# Patient Record
Sex: Female | Born: 1955 | Race: White | Hispanic: No | Marital: Married | Smoking: Never smoker
Health system: Southern US, Community
[De-identification: ages and names within clinical notes are randomized; demographics above are authoritative.]

## PROBLEM LIST (undated history)

## (undated) DIAGNOSIS — K219 Gastro-esophageal reflux disease without esophagitis: Secondary | ICD-10-CM

## (undated) DIAGNOSIS — R140 Abdominal distension (gaseous): Secondary | ICD-10-CM

## (undated) DIAGNOSIS — R197 Diarrhea, unspecified: Secondary | ICD-10-CM

## (undated) DIAGNOSIS — R142 Eructation: Secondary | ICD-10-CM

## (undated) DIAGNOSIS — R011 Cardiac murmur, unspecified: Secondary | ICD-10-CM

## (undated) DIAGNOSIS — E785 Hyperlipidemia, unspecified: Secondary | ICD-10-CM

## (undated) DIAGNOSIS — D126 Benign neoplasm of colon, unspecified: Secondary | ICD-10-CM

## (undated) DIAGNOSIS — C801 Malignant (primary) neoplasm, unspecified: Secondary | ICD-10-CM

## (undated) HISTORY — DX: Abdominal distension (gaseous): R14.0

## (undated) HISTORY — DX: Diarrhea, unspecified: R19.7

## (undated) HISTORY — DX: Eructation: R14.2

## (undated) HISTORY — DX: Gastro-esophageal reflux disease without esophagitis: K21.9

## (undated) HISTORY — PX: BLADDER SURGERY: SHX569

## (undated) HISTORY — DX: Benign neoplasm of colon, unspecified: D12.6

---

## 1999-02-11 ENCOUNTER — Other Ambulatory Visit: Admission: RE | Admit: 1999-02-11 | Discharge: 1999-02-11 | Payer: Self-pay | Admitting: Obstetrics and Gynecology

## 2000-03-04 ENCOUNTER — Other Ambulatory Visit: Admission: RE | Admit: 2000-03-04 | Discharge: 2000-03-04 | Payer: Self-pay | Admitting: Obstetrics and Gynecology

## 2001-03-14 ENCOUNTER — Other Ambulatory Visit: Admission: RE | Admit: 2001-03-14 | Discharge: 2001-03-14 | Payer: Self-pay | Admitting: Obstetrics and Gynecology

## 2002-04-24 ENCOUNTER — Other Ambulatory Visit: Admission: RE | Admit: 2002-04-24 | Discharge: 2002-04-24 | Payer: Self-pay | Admitting: Obstetrics and Gynecology

## 2003-05-28 ENCOUNTER — Other Ambulatory Visit: Admission: RE | Admit: 2003-05-28 | Discharge: 2003-05-28 | Payer: Self-pay | Admitting: Obstetrics and Gynecology

## 2004-07-09 ENCOUNTER — Other Ambulatory Visit: Admission: RE | Admit: 2004-07-09 | Discharge: 2004-07-09 | Payer: Self-pay | Admitting: Obstetrics and Gynecology

## 2005-02-20 ENCOUNTER — Other Ambulatory Visit: Admission: RE | Admit: 2005-02-20 | Discharge: 2005-02-20 | Payer: Self-pay | Admitting: Obstetrics and Gynecology

## 2006-11-03 HISTORY — PX: US ECHOCARDIOGRAPHY: HXRAD669

## 2009-01-23 ENCOUNTER — Telehealth: Payer: Self-pay | Admitting: Gastroenterology

## 2009-02-05 DIAGNOSIS — D126 Benign neoplasm of colon, unspecified: Secondary | ICD-10-CM

## 2009-02-05 HISTORY — DX: Benign neoplasm of colon, unspecified: D12.6

## 2009-02-12 ENCOUNTER — Encounter (INDEPENDENT_AMBULATORY_CARE_PROVIDER_SITE_OTHER): Payer: Self-pay

## 2009-02-13 ENCOUNTER — Ambulatory Visit: Payer: Self-pay | Admitting: Gastroenterology

## 2009-02-27 ENCOUNTER — Ambulatory Visit: Payer: Self-pay | Admitting: Gastroenterology

## 2009-03-01 ENCOUNTER — Encounter: Payer: Self-pay | Admitting: Gastroenterology

## 2009-06-28 ENCOUNTER — Encounter (INDEPENDENT_AMBULATORY_CARE_PROVIDER_SITE_OTHER): Payer: Self-pay | Admitting: *Deleted

## 2009-09-30 ENCOUNTER — Telehealth: Payer: Self-pay | Admitting: Gastroenterology

## 2009-11-01 ENCOUNTER — Ambulatory Visit: Payer: Self-pay | Admitting: Cardiovascular Disease

## 2009-11-25 ENCOUNTER — Telehealth: Payer: Self-pay | Admitting: Gastroenterology

## 2010-01-05 HISTORY — PX: CHOLECYSTECTOMY: SHX55

## 2010-02-04 NOTE — Progress Notes (Signed)
Summary: questions  Phone Note Call from Patient Call back at Home Phone [phone] Call back at [phone]   Caller: Patient Call For: Dr. Russella Dar Reason for Call: Talk to Nurse Details for Reason: Questions Summary of Call: 1. Needs flex sig. Does she have to drink prep night before and morning of? She wants to just do the night before. 2. Insurance does not cover unless it is scheduled as an office procedure. Can it be coded that way so that her insurance will pay?  Please call patient and advise. Initial call taken by: Schuyler Amor,  November 25, 2009 10:12 AM  Follow-up for Phone Call        Left message for patient to call back Darcey Nora RN, [facility]  November 25, 2009 10:49 AM  Reviewed with the patient that can't be coded as a office procedure.  I have also reviewed the flex prep instructions with the patient.  She was driving and wants to call back to schedule.  Her husband is out of town in [place] for the next few weeks and she also needs to get with his schedule. Follow-up by: Darcey Nora RN, CGRN,  November 25, 2009 1:47 PM

## 2010-02-04 NOTE — Procedures (Signed)
Summary: Colonoscopy  Patient: [name] Note: All result statuses are Final unless otherwise noted.  Tests: (1) Colonoscopy (COL)   COL Colonoscopy           DONE     [facility]     [address]     [place], [place]  [address]           COLONOSCOPY PROCEDURE REPORT           PATIENT:  [name], [name]  MR#:  [id number]     BIRTHDATE:  1955/03/18, 53 yrs. old  GENDER:  female           ENDOSCOPIST:  Judie Petit T. Russella Dar, MD, [facility]     Referred by:  Harold Hedge, M.D.           PROCEDURE DATE:  02/27/2009     PROCEDURE:  Colonoscopy with snare polypectomy, and with     submucosal injection     ASA CLASS:  Class II     INDICATIONS:  1) Routine Risk Screening           MEDICATIONS:   Fentanyl 100 mcg IV, Versed 8 mg IV           DESCRIPTION OF PROCEDURE:   After the risks benefits and     alternatives of the procedure were thoroughly explained, informed     consent was obtained.  Digital rectal exam was performed and     revealed no abnormalities.   The LB PCF-H180AL [id number] endoscope     was introduced through the anus and advanced to the cecum, which     was identified by both the appendix and ileocecal valve, without     limitations.  The quality of the prep was excellent, using     MoviPrep.  The instrument was then slowly withdrawn as the colon     was fully examined.     <<PROCEDUREIMAGES>>           FINDINGS:  A sessile polyp was found in the rectum. It was 16 mm     in size. Polyp was snared, then cauterized with monopolar cautery.     Retrieval was successful. Piecemeal polypectomy which appeared to     be complete.  Normal muscosa otherwise in the rectum. Submucosal     injection of [place] ink  at two sites on both sides of the     polypectomy site. A normal appearing cecum, ileocecal valve, and     appendiceal orifice were identified. The ascending, hepatic     flexure, transverse, splenic flexure, descending, sigmoid colon     appeared unremarkable.   Retroflexed views in the rectum revealed no     abnormalities.  The time to cecum =  4.25  minutes. The scope was     then withdrawn (time =  19.25  min) from the patient and the     procedure completed.           COMPLICATIONS:  None           ENDOSCOPIC IMPRESSION:     1) 16 mm sessile polyp in the rectum           RECOMMENDATIONS:     1) No aspirin or NSAID's for 2 weeks     2) Await pathology results     3) Sigmoidoscopy in 6 months to assess polypectomy site.     4) Repeat Colonoscopy in 1-3 years pending pathology.  Venita Lick. Russella Dar, MD, [facility]           n.     eSIGNED:   Venita Lick. Ardyn Forge at 02/27/2009 10:55 AM           Rowe Pavy, [id number]  Note: An exclamation mark (!) indicates a result that was not dispersed into the flowsheet. Document Creation Date: 02/27/2009 10:55 AM _______________________________________________________________________  (1) Order result status: Final Collection or observation date-time: 02/27/2009 10:47 Requested date-time:  Receipt date-time:  Reported date-time:  Referring Physician:   Ordering Physician: Claudette Head [phone]) Specimen Source:  Source: Launa Grill Order Number: [id number] Lab site:   Appended Document: Colonoscopy     Procedures Next Due Date:    Flexible Sigmoidoscopy: 08/2009    Colonoscopy: 02/2012

## 2010-02-04 NOTE — Progress Notes (Signed)
  Reminded pt when she was here at appt with her mother today that she needed to schedule her flex sig to be sure her polyp was completely removed. She stated she did receive the letter, knows she needs to schedule and will call back to do so.

## 2010-02-04 NOTE — Progress Notes (Signed)
Summary: ? for nurse before proc  Phone Note Call from Patient Call back at Home Phone [phone] Call back at CELL [phone]   Caller: Patient Call For: Markeis Allman Reason for Call: Talk to Nurse Summary of Call: Patient scheduled a direct col do to age for Feb 23 but was told by her ob/gyn that she has positive stool cards wants to be told by a nurse that she can wait until her her col date and that she does not need an ov. Initial call taken by: Tawni Levy,  January 23, 2009 10:00 AM  Follow-up for Phone Call        patient had 2 heme positive out of 3, no change in bowel habits, no  other GI concerns.  I advised patient that the colon date for Feb is fine.  She will call back with further problems or concerns   Follow-up by: Darcey Nora RN, CGRN,  January 23, 2009 10:20 AM

## 2010-02-04 NOTE — Miscellaneous (Signed)
Summary: Lec previsit  Clinical Lists Changes  Medications: Added new medication of MOVIPREP 100 GM  SOLR (PEG-KCL-NACL-NASULF-NA ASC-C) As per prep instructions. - Signed Rx of MOVIPREP 100 GM  SOLR (PEG-KCL-NACL-NASULF-NA ASC-C) As per prep instructions.;  #1 x 0;  Signed;  Entered by: Ulis Rias RN;  Authorized by: Meryl Dare MD [facility];  Method used: [facility]  [address] [phone]*, [address], [place], [place], [place]  [address], Ph: [phone] or [phone], Fax: [phone] Observations: Added new observation of NKA: T (02/13/2009 8:53)    Prescriptions: MOVIPREP 100 GM  SOLR (PEG-KCL-NACL-NASULF-NA ASC-C) As per prep instructions.  #1 x 0   Entered by:   Ulis Rias RN   Authorized by:   Meryl Dare MD [facility]   Signed by:   Ulis Rias RN on 02/13/2009   Method used:   [facility]        [facility]  [address] [phone]* (retail)       [address]       [place], [place]  [address]       Ph: [phone] or [phone]       Fax: [phone]   RxID:   [id number]

## 2010-02-04 NOTE — Letter (Signed)
Summary: Flex Sigmoidoscopy Letter  [facility]  [address] [place], [place] [address]   Phone: [phone]  Fax: [phone]      June 28, 2009 MRN: [id number]   [name] [address] [place], [place]  [address]   Dear [name],   According to your medical record, it is time for you to schedule a Flexible Sigmoidoscopy. The American Cancer Society recommends this procedure as a method to detect early colon cancer. Patients with a family history of colon cancer, or a personal history of colon polyps or imflammatory bowel disease are at increased risk.  This letter has been generated based on the recommendations made at the time of your prior procedure. If you feel that in your particular situation this may no longer apply, please contact our office.  Please call our office at [phone]) to schedule this appointment or to update your records at your earliest convenience.  Thank you for cooperating with [place] to provide you with the very best care possible.   Sincerely,  Judie Petit T. Russella Dar, M.D.  [facility] [phone]

## 2010-02-04 NOTE — Letter (Signed)
Summary: [facility] Instructions  [facility]  [address] [place], [place] [address]   Phone: [phone]  Fax: [phone]       [name]    [birth year]    MRN: [id number]        Procedure Day Dorna Bloom:   Wednesday 02/27/09     Arrival Time:  9:00am     Procedure Time:  10:00am     Location of Procedure:                    _X _  [facility] (4th Floor)                        PREPARATION FOR COLONOSCOPY WITH MOVIPREP   Starting 5 days prior to your procedure   Friday 02/23 do not eat nuts, seeds, popcorn, corn, beans, peas,  salads, or any raw vegetables.  Do not take any fiber supplements (e.g. Metamucil, Citrucel, and Benefiber).  THE DAY BEFORE YOUR PROCEDURE         DATE:  02/22  DAY:  Tuesday  1.  Drink clear liquids the entire day-NO SOLID FOOD  2.  Do not drink anything colored red or purple.  Avoid juices with pulp.  No orange juice.  3.  Drink at least 64 oz. (8 glasses) of fluid/clear liquids during the day to prevent dehydration and help the prep work efficiently.  CLEAR LIQUIDS INCLUDE: Water Jello Ice Popsicles Tea (sugar ok, no milk/cream) Powdered fruit flavored drinks Coffee (sugar ok, no milk/cream) Gatorade Juice: apple, white grape, white cranberry  Lemonade Clear bullion, consomm, broth Carbonated beverages (any kind) Strained chicken noodle soup Hard Candy                             4.  In the morning, mix first dose of MoviPrep solution:    Empty 1 Pouch A and 1 Pouch B into the disposable container    Add lukewarm drinking water to the top line of the container. Mix to dissolve    Refrigerate (mixed solution should be used within 24 hrs)  5.  Begin drinking the prep at 5:00 p.m. The MoviPrep container is divided by 4 marks.   Every 15 minutes drink the solution down to the next mark (approximately 8 oz) until the full liter is complete.   6.  Follow completed prep with 16 oz of clear liquid of your  choice (Nothing red or purple).  Continue to drink clear liquids until bedtime.  7.  Before going to bed, mix second dose of MoviPrep solution:    Empty 1 Pouch A and 1 Pouch B into the disposable container    Add lukewarm drinking water to the top line of the container. Mix to dissolve    Refrigerate  THE DAY OF YOUR PROCEDURE      DATE:  02/23  DAY:  Wednesday  Beginning at  5:00 a.m. (5 hours before procedure):         1. Every 15 minutes, drink the solution down to the next mark (approx 8 oz) until the full liter is complete.  2. Follow completed prep with 16 oz. of clear liquid of your choice.    3. You may drink clear liquids until 8:00am (2 HOURS BEFORE PROCEDURE).   MEDICATION INSTRUCTIONS  Unless otherwise instructed, you should take regular prescription medications with a small sip of  water   as early as possible the morning of your procedure.          OTHER INSTRUCTIONS  You will need a responsible adult at least 53 years of age to accompany you and drive you home.   This person must remain in the waiting room during your procedure.  Wear loose fitting clothing that is easily removed.  Leave jewelry and other valuables at home.  However, you may wish to bring a book to read or  an iPod/MP3 player to listen to music as you wait for your procedure to start.  Remove all body piercing jewelry and leave at home.  Total time from sign-in until discharge is approximately 2-3 hours.  You should go home directly after your procedure and rest.  You can resume normal activities the  day after your procedure.  The day of your procedure you should not:   Drive   Make legal decisions   Operate machinery   Drink alcohol   Return to work  You will receive specific instructions about eating, activities and medications before you leave.    The above instructions have been reviewed and explained to me by   Ulis Rias RN  February 13, 2009 9:20 AM     I  fully understand and can verbalize these instructions _____________________________ Date _________

## 2010-02-04 NOTE — Letter (Signed)
Summary: Patient Notice- Polyp Results  [facility]  [address] [place], [place] [address]   Phone: [phone]  Fax: [phone]        March 01, 2009 MRN: [id number]    [name] [address] [place], [place]  [address]    Dear [name],  I am pleased to inform you that the colon polyp(s) removed during your recent colonoscopy was (were) found to be benign (no cancer detected) upon pathologic examination.  I recommend that you have sigmoidoscopy in 6 months to make sure the entire polyp was removed and that you have a repeat colonoscopy examination in 3 years to look for recurrent polyps, as having colon polyps increases your risk for having recurrent polyps or even colon cancer in the future.  Should you develop new or worsening symptoms of abdominal pain, bowel habit changes or bleeding from the rectum or bowels, please schedule an evaluation with either your primary care physician or with me.  Continue treatment plan as outlined the day of your exam.  Please call us if you are having persistent problems or have questions about your condition that have not been fully answered at this time.  Sincerely,  Meryl Dare MD [facility]  This letter has been electronically signed by your physician.  Appended Document: Patient Notice- Polyp Results Letter mailed 3.1.11

## 2010-03-31 ENCOUNTER — Other Ambulatory Visit: Payer: Self-pay | Admitting: *Deleted

## 2010-10-15 ENCOUNTER — Telehealth: Payer: Self-pay | Admitting: Cardiovascular Disease

## 2010-10-15 ENCOUNTER — Telehealth: Payer: Self-pay | Admitting: *Deleted

## 2010-10-15 DIAGNOSIS — E785 Hyperlipidemia, unspecified: Secondary | ICD-10-CM

## 2010-10-15 NOTE — Telephone Encounter (Signed)
Pt returning call from [place]. Please call back.

## 2010-10-15 NOTE — Telephone Encounter (Signed)
Wants labs prior to dr app/ order placed

## 2010-10-15 NOTE — Telephone Encounter (Signed)
Pt called. Pt wants to know about cholesterol test please call

## 2010-10-15 NOTE — Telephone Encounter (Signed)
Called x 2 w/ msg left to call back, unsure what pt needs.

## 2010-10-17 ENCOUNTER — Ambulatory Visit (INDEPENDENT_AMBULATORY_CARE_PROVIDER_SITE_OTHER): Payer: BC Managed Care – PPO | Admitting: *Deleted

## 2010-10-17 DIAGNOSIS — E7800 Pure hypercholesterolemia, unspecified: Secondary | ICD-10-CM

## 2010-10-17 LAB — LIPID PANEL
HDL: 71.4 mg/dL (ref >39.00)
Total CHOL/HDL Ratio: 3
Triglycerides: 61 mg/dL (ref 0.0–149.0)
VLDL: 12.2 mg/dL (ref 0.0–40.0)

## 2010-10-17 LAB — HEPATIC FUNCTION PANEL
ALT: 323 U/L — ABNORMAL HIGH (ref 0–35)
AST: 285 U/L — ABNORMAL HIGH (ref 0–37)
Bilirubin, Direct: 0 mg/dL (ref 0.0–0.3)
Total Bilirubin: 0.5 mg/dL (ref 0.3–1.2)
Total Protein: 7.2 g/dL (ref 6.0–8.3)

## 2010-10-17 LAB — BASIC METABOLIC PANEL
Calcium: 9.2 mg/dL (ref 8.4–10.5)
Creatinine, Ser: 0.9 mg/dL (ref 0.4–1.2)
GFR: 68.63 mL/min (ref >60.00)
Sodium: 140 mEq/L (ref 135–145)

## 2010-10-22 ENCOUNTER — Telehealth: Payer: Self-pay | Admitting: *Deleted

## 2010-10-22 NOTE — Telephone Encounter (Signed)
Dr Elease Hashimoto spoke with pt

## 2010-10-22 NOTE — Telephone Encounter (Signed)
Message copied by Eugenia Pancoast on Wed Oct 22, 2010  9:05 AM ------      Message from: [place], [place]      Created: Sun Oct 19, 2010  9:54 PM       LFTs are elevated.  I have talked to [facility] - she is not taking any statin.  No other illness.  Will rechech Lipids and HFP when I see her in  Month.

## 2010-10-22 NOTE — Telephone Encounter (Signed)
Test results

## 2010-10-22 NOTE — Telephone Encounter (Signed)
Dr. Elease Hashimoto spoke with patient

## 2010-10-23 ENCOUNTER — Emergency Department (HOSPITAL_COMMUNITY)
Admission: EM | Admit: 2010-10-23 | Discharge: 2010-10-23 | Disposition: A | Discharge status: Home / Self Care | Payer: BC Managed Care – PPO | Attending: Emergency Medicine | Admitting: Emergency Medicine

## 2010-10-23 ENCOUNTER — Emergency Department (HOSPITAL_COMMUNITY): Payer: BC Managed Care – PPO

## 2010-10-23 ENCOUNTER — Telehealth: Payer: Self-pay | Admitting: *Deleted

## 2010-10-23 ENCOUNTER — Encounter (HOSPITAL_COMMUNITY): Payer: Self-pay | Admitting: Radiology

## 2010-10-23 DIAGNOSIS — R740 Nonspecific elevation of levels of transaminase and lactic acid dehydrogenase [LDH]: Secondary | ICD-10-CM

## 2010-10-23 DIAGNOSIS — E785 Hyperlipidemia, unspecified: Secondary | ICD-10-CM | POA: Insufficient documentation

## 2010-10-23 DIAGNOSIS — R0789 Other chest pain: Secondary | ICD-10-CM | POA: Insufficient documentation

## 2010-10-23 DIAGNOSIS — R110 Nausea: Secondary | ICD-10-CM | POA: Insufficient documentation

## 2010-10-23 DIAGNOSIS — R1033 Periumbilical pain: Secondary | ICD-10-CM

## 2010-10-23 DIAGNOSIS — R1013 Epigastric pain: Secondary | ICD-10-CM | POA: Insufficient documentation

## 2010-10-23 DIAGNOSIS — F411 Generalized anxiety disorder: Secondary | ICD-10-CM | POA: Insufficient documentation

## 2010-10-23 DIAGNOSIS — R7401 Elevation of levels of liver transaminase levels: Secondary | ICD-10-CM

## 2010-10-23 DIAGNOSIS — K759 Inflammatory liver disease, unspecified: Secondary | ICD-10-CM | POA: Insufficient documentation

## 2010-10-23 DIAGNOSIS — Z79899 Other long term (current) drug therapy: Secondary | ICD-10-CM | POA: Insufficient documentation

## 2010-10-23 HISTORY — DX: Hyperlipidemia, unspecified: E78.5

## 2010-10-23 LAB — URINALYSIS, ROUTINE W REFLEX MICROSCOPIC
Leukocytes, UA: NEGATIVE
Protein, ur: NEGATIVE mg/dL
Urobilinogen, UA: 0.2 mg/dL (ref 0.0–1.0)

## 2010-10-23 LAB — COMPREHENSIVE METABOLIC PANEL
BUN: 13 mg/dL (ref 6–23)
CO2: 24 mEq/L (ref 19–32)
Calcium: 10.1 mg/dL (ref 8.4–10.5)
Creatinine, Ser: 0.77 mg/dL (ref 0.50–1.10)
GFR calc Af Amer: >90 mL/min (ref >90)
GFR calc non Af Amer: >90 mL/min (ref >90)
Glucose, Bld: 84 mg/dL (ref 70–99)

## 2010-10-23 LAB — DIFFERENTIAL
Eosinophils Relative: 1 % (ref 0–5)
Lymphocytes Relative: 5 % — ABNORMAL LOW (ref 12–46)
Monocytes Absolute: 0.7 10*3/uL (ref 0.1–1.0)
Monocytes Relative: 12 % (ref 3–12)
Neutro Abs: 4.5 10*3/uL (ref 1.7–7.7)

## 2010-10-23 LAB — HEPATIC FUNCTION PANEL
AST: 873 U/L — ABNORMAL HIGH (ref 0–37)
Alkaline Phosphatase: 299 U/L — ABNORMAL HIGH (ref 39–117)
Bilirubin, Direct: 0.9 mg/dL — ABNORMAL HIGH (ref 0.0–0.3)
Total Bilirubin: 2.7 mg/dL — ABNORMAL HIGH (ref 0.3–1.2)

## 2010-10-23 LAB — CBC
HCT: 42.1 % (ref 36.0–46.0)
Hemoglobin: 14.5 g/dL (ref 12.0–15.0)
MCH: 29.7 pg (ref 26.0–34.0)
MCHC: 34.4 g/dL (ref 30.0–36.0)

## 2010-10-23 LAB — POCT I-STAT TROPONIN I: Troponin i, poc: 0 ng/mL (ref 0.00–0.08)

## 2010-10-23 MED ORDER — IOHEXOL 300 MG/ML  SOLN
80.0000 mL | Freq: Once | INTRAMUSCULAR | Status: AC | PRN
  Administered 2010-10-23: 80 mL via INTRAVENOUS

## 2010-10-23 NOTE — Telephone Encounter (Signed)
A nurse from [facility] ER called today, about 3:00 PM  asking for Dr. Claudette Head.  I advised he was off for the afternoon.  The nurse asked if a Doc of the day could please call Dr. Cyndra Numbers at [phone].  I was working with Mike Gip PA at the time, so I couldn't take care of this right away.  I later spoke to Dr. Marina Goodell Doc of the day today and he looked at the CT scan the pt had done today at the hospital and labs as well.  He suggested I send this to Dr. Russella Dar tomorrow 10-24-2010.  The nurse did not say what the MD wanted.

## 2010-10-23 NOTE — Consult Note (Signed)
NAMEKEILEIGH, VAHEY NO.:  [id number]  MEDICAL RECORD NO.:  [id number]  LOCATION:  [facility]                         FACILITY:  [facility]  PHYSICIAN:  Andreas Blower, MD       DATE OF BIRTH:  10/11/1955  DATE OF CONSULTATION: DATE OF DISCHARGE:                                CONSULTATION   PRIMARY CARE PHYSICIAN:  Dr. Jarold Motto who she has not seen since 2007.  PRIMARY CARDIOLOGIST:  Vesta Mixer, M.D.  REFERING PHYSICIAN: Dr. Alto Denver, ED.  CHIEF COMPLAINT:  Abdominal/epigastric pain.  HISTORY OF PRESENT ILLNESS:  Ms. Brissett is a 55 year old Caucasian female with a history of hyperlipidemia, otherwise, no significant past medical history, presented because the patient had been having some epigastric/abdominal pain since yesterday.  Her symptoms started about 4 p.m. yesterday in the epigastric region with some tightness in her chest.  She presented to the ER where she had initially a chest x-ray which was negative, an abdominal ultrasound which was unremarkable, however, CT of abdomen and pelvis showed mild periportal edema with pericholecystic fluid nonspecific perhaps reflecting hepatic inflammatory process such as hepatitis.  With elevated LFTs, hospitalist service was consulted for further management.  The patient denies any recent fevers, does complain about chills, has some nausea, did vomit earlier today.  Denies any diarrhea.  Denies any headaches or vision changes.  Does complain about pain in the epigastric region.  The patient reports that she is on Lipitor for hyperlipidemia, which she has not taken over the last 6 months except last night.  She does report having a colonoscopy a few months ago by Dr. Russella Dar with [facility] and had a polyp removed and did not followup subsequently for sigmoidoscopy.  REVIEW OF SYSTEMS:  All systems were reviewed with the patient was positive as per HPI, otherwise all other systems are negative.  PAST MEDICAL HISTORY:   Hyperlipidemia.  SOCIAL HISTORY:  The patient does not smoke, drinks alcohol once a month.  Denies any illegal drugs or substances.  She is a Futures trader.  FAMILY HISTORY:  Mother has a history of dry eyes, has history of coronary artery disease.  HOME MEDICATIONS:  Lipitor, has not taken it over the last 6 months.  PHYSICAL EXAMINATION:  VITAL SIGNS:  Temperature is 98.4, blood pressure is 117/71, heart rate 76, respirations 13, satting at 98% on room air. GENERAL:  The patient was alert, oriented, did not appear to be in acute distress, was lying in bed comfortably. HEENT:  Extraocular motions are intact.  Pupils are equal, round.  Had moist mucous membranes. NECK:  Supple. HEART:  Regular with S1 and S2. LUNGS:  Clear to auscultation bilaterally. ABDOMEN:  Soft, nondistended, was slightly tender to palpation in the right upper quadrant.  No guarding or rebound tenderness. EXTREMITIES:  The patient good peripheral pulses with trace edema.  No clubbing or cyanosis. NEUROLOGIC:  Cranial nerves II through XII grossly intact, had 5/5 motor strength in upper as well as lower extremities.  LABORATORY DATA:  CBC shows a white count of 5.0, hemoglobin 14.5, hematocrit 42.1, platelet count 258.  Electrolytes normal with a BUN of 13, creatinine 0.77.  Liver function tests showed T-bili of 2.7, alk phos is 299, AST is 860, ALT is 784.  Troponin negative x2.  CK is 123, lipase is 26, UA is negative for nitrites and leukocytes.  ASSESSMENT AND PLAN: 1. Epigastric pain with elevated liver function tests.  Etiology     unclear at this time.  The patient reports that she has not taken     her atorvastatin except last night over the last 6 months.  The     patient does report a possible food poisoning about a week ago,     uncertain if that is what causing the symptoms.  [facility] has     been consulted.  Workup has been initiated for elevated liver     function tests, including a hepatitis  panel, antimitochondrial     antibodies.  [facility] is also contemplating sending autoimmune     workup such as ANA.  The patient reports her pain is well     controlled and would like to be discharged with close followup with     the [facility] which will be arranged.  It is less likely that this     is cardiac in nature as EKG shows normal sinus rhythm with negative     troponins x2.  The patient will receive some p.r.n. pain     medications for pain to control her symptoms until further     evaluation is complete. 2. Hyperlipidemia.  Instructed the patient to hold statin until     evaluated by GI for elevated liver function tests.  DISPOSITION:  Followup, the patient will have close follow up with [facility] at discharge which is in the process of being arranged.  The patient also indicated that she will follow with Dr. Jarold Motto, her primary care physician for further evaluation.  Time spent on the consult talking to the patient, coordinating care was 40 minutes.     Andreas Blower, MD     SR/MEDQ  D:  10/23/2010  T:  10/23/2010  Job:  [id number]  Electronically Signed by Wardell Heath [facility]ni Dunsworth  on 10/23/2010 11:14:13 PM

## 2010-10-24 ENCOUNTER — Telehealth: Payer: Self-pay | Admitting: Gastroenterology

## 2010-10-24 DIAGNOSIS — R1011 Right upper quadrant pain: Secondary | ICD-10-CM

## 2010-10-24 LAB — HEPATITIS PANEL, ACUTE
HCV Ab: NEGATIVE
Hepatitis B Surface Ag: NEGATIVE

## 2010-10-24 NOTE — Telephone Encounter (Signed)
Per Jennye Moccasin, PA patient needs HIDA, LFTs and office visit follow up with Dr Russella Dar or an extender.  HIDA scheduled for 11/10/10 8:00 [facility].  Patient is advised to come for lab work on 10/28/10.  NP3 is scheduled for 11/12/10 with DR Russella Dar

## 2010-10-24 NOTE — Telephone Encounter (Signed)
Per Dr Russella Dar he has reviewed her chart would like to see her on Tuesday when he is in the office.  She is asked to come for lab work on Monday.

## 2010-10-24 NOTE — Telephone Encounter (Signed)
She would like to speak with Dr Russella Dar.  She also wanted to be aware that her mother also had her gallbladder removed.  Dr Russella Dar please call her at your convenience

## 2010-10-25 LAB — URINE CULTURE: Colony Count: 6000

## 2010-10-26 NOTE — Telephone Encounter (Signed)
Returned call. Pt having upper abdominal pain and nausea since Wednesday. She notes yellow eyes today. No fevers. Pain controlled fairly well with round the clock medications. Continue current pain medication and antiemetic along with a light today and clear liquids in the morning. If pain does not resolve will plan to admit tomorrow for further evaluation.

## 2010-10-27 ENCOUNTER — Inpatient Hospital Stay (HOSPITAL_COMMUNITY): Payer: BC Managed Care – PPO

## 2010-10-27 ENCOUNTER — Inpatient Hospital Stay (HOSPITAL_COMMUNITY)
Admission: AD | Admit: 2010-10-27 | Discharge: 2010-10-31 | DRG: 494 | Disposition: A | Discharge status: Home / Self Care | Payer: BC Managed Care – PPO | Source: Ambulatory Visit | Attending: Internal Medicine | Admitting: Internal Medicine

## 2010-10-27 DIAGNOSIS — R932 Abnormal findings on diagnostic imaging of liver and biliary tract: Secondary | ICD-10-CM | POA: Diagnosis present

## 2010-10-27 DIAGNOSIS — R1011 Right upper quadrant pain: Secondary | ICD-10-CM

## 2010-10-27 DIAGNOSIS — R7401 Elevation of levels of liver transaminase levels: Secondary | ICD-10-CM

## 2010-10-27 DIAGNOSIS — R740 Nonspecific elevation of levels of transaminase and lactic acid dehydrogenase [LDH]: Secondary | ICD-10-CM

## 2010-10-27 DIAGNOSIS — K819 Cholecystitis, unspecified: Secondary | ICD-10-CM

## 2010-10-27 DIAGNOSIS — R1013 Epigastric pain: Secondary | ICD-10-CM | POA: Diagnosis present

## 2010-10-27 DIAGNOSIS — R109 Unspecified abdominal pain: Secondary | ICD-10-CM

## 2010-10-27 DIAGNOSIS — E785 Hyperlipidemia, unspecified: Secondary | ICD-10-CM | POA: Diagnosis present

## 2010-10-27 DIAGNOSIS — R112 Nausea with vomiting, unspecified: Secondary | ICD-10-CM | POA: Diagnosis present

## 2010-10-27 DIAGNOSIS — Z87898 Personal history of other specified conditions: Secondary | ICD-10-CM

## 2010-10-27 DIAGNOSIS — R7989 Other specified abnormal findings of blood chemistry: Secondary | ICD-10-CM | POA: Diagnosis present

## 2010-10-27 DIAGNOSIS — K828 Other specified diseases of gallbladder: Principal | ICD-10-CM | POA: Diagnosis present

## 2010-10-27 LAB — COMPREHENSIVE METABOLIC PANEL
Albumin: 3.7 g/dL (ref 3.5–5.2)
BUN: 15 mg/dL (ref 6–23)
Calcium: 9.7 mg/dL (ref 8.4–10.5)
Creatinine, Ser: 0.94 mg/dL (ref 0.50–1.10)
GFR calc Af Amer: 79 mL/min — ABNORMAL LOW (ref >90)
Glucose, Bld: 81 mg/dL (ref 70–99)
Potassium: 3.7 mEq/L (ref 3.5–5.1)
Total Protein: 6.6 g/dL (ref 6.0–8.3)

## 2010-10-27 LAB — PROTIME-INR
INR: 1.04 (ref 0.00–1.49)
Prothrombin Time: 13.8 seconds (ref 11.6–15.2)

## 2010-10-27 LAB — LIPASE, BLOOD: Lipase: 23 U/L (ref 11–59)

## 2010-10-27 LAB — CBC
Hemoglobin: 12.6 g/dL (ref 12.0–15.0)
MCHC: 33.5 g/dL (ref 30.0–36.0)
Platelets: 258 10*3/uL (ref 150–400)

## 2010-10-27 MED ORDER — TECHNETIUM TC 99M MEBROFENIN IV KIT
4.7000 | PACK | Freq: Once | INTRAVENOUS | Status: AC | PRN
  Administered 2010-10-27: 5 via INTRAVENOUS

## 2010-10-27 MED ORDER — SINCALIDE 5 MCG IJ SOLR: 0.0200 ug/kg | Freq: Once | INTRAMUSCULAR | Status: DC

## 2010-10-28 ENCOUNTER — Ambulatory Visit: Payer: BC Managed Care – PPO | Admitting: Gastroenterology

## 2010-10-28 DIAGNOSIS — R932 Abnormal findings on diagnostic imaging of liver and biliary tract: Secondary | ICD-10-CM

## 2010-10-28 DIAGNOSIS — R740 Nonspecific elevation of levels of transaminase and lactic acid dehydrogenase [LDH]: Secondary | ICD-10-CM

## 2010-10-28 DIAGNOSIS — R945 Abnormal results of liver function studies: Secondary | ICD-10-CM

## 2010-10-28 DIAGNOSIS — R109 Unspecified abdominal pain: Secondary | ICD-10-CM

## 2010-10-28 DIAGNOSIS — R7401 Elevation of levels of liver transaminase levels: Secondary | ICD-10-CM

## 2010-10-28 LAB — HEPATIC FUNCTION PANEL
ALT: 286 U/L — ABNORMAL HIGH (ref 0–35)
AST: 190 U/L — ABNORMAL HIGH (ref 0–37)
Albumin: 3.1 g/dL — ABNORMAL LOW (ref 3.5–5.2)
Total Protein: 5.9 g/dL — ABNORMAL LOW (ref 6.0–8.3)

## 2010-10-29 ENCOUNTER — Inpatient Hospital Stay (HOSPITAL_COMMUNITY): Payer: BC Managed Care – PPO

## 2010-10-29 LAB — COMPREHENSIVE METABOLIC PANEL
ALT: 296 U/L — ABNORMAL HIGH (ref 0–35)
Albumin: 3.1 g/dL — ABNORMAL LOW (ref 3.5–5.2)
Alkaline Phosphatase: 232 U/L — ABNORMAL HIGH (ref 39–117)
Potassium: 4 mEq/L (ref 3.5–5.1)
Sodium: 138 mEq/L (ref 135–145)
Total Protein: 5.9 g/dL — ABNORMAL LOW (ref 6.0–8.3)

## 2010-10-29 LAB — CBC
HCT: 35.7 % — ABNORMAL LOW (ref 36.0–46.0)
Hemoglobin: 11.8 g/dL — ABNORMAL LOW (ref 12.0–15.0)
MCH: 29.1 pg (ref 26.0–34.0)
MCHC: 33.1 g/dL (ref 30.0–36.0)
RBC: 4.06 MIL/uL (ref 3.87–5.11)

## 2010-10-29 MED ORDER — GADOBENATE DIMEGLUMINE 529 MG/ML IV SOLN
15.0000 mL | Freq: Once | INTRAVENOUS | Status: AC | PRN
  Administered 2010-10-29: 13 mL via INTRAVENOUS

## 2010-10-30 ENCOUNTER — Other Ambulatory Visit (INDEPENDENT_AMBULATORY_CARE_PROVIDER_SITE_OTHER): Payer: Self-pay | Admitting: General Surgery

## 2010-10-30 DIAGNOSIS — R7401 Elevation of levels of liver transaminase levels: Secondary | ICD-10-CM

## 2010-10-30 DIAGNOSIS — K811 Chronic cholecystitis: Secondary | ICD-10-CM

## 2010-10-30 DIAGNOSIS — R932 Abnormal findings on diagnostic imaging of liver and biliary tract: Secondary | ICD-10-CM

## 2010-10-30 DIAGNOSIS — R740 Nonspecific elevation of levels of transaminase and lactic acid dehydrogenase [LDH]: Secondary | ICD-10-CM

## 2010-10-30 DIAGNOSIS — R109 Unspecified abdominal pain: Secondary | ICD-10-CM

## 2010-10-30 LAB — SURGICAL PCR SCREEN: Staphylococcus aureus: INVALID — AB

## 2010-10-31 ENCOUNTER — Other Ambulatory Visit: Payer: Self-pay | Admitting: Gastroenterology

## 2010-10-31 DIAGNOSIS — R7989 Other specified abnormal findings of blood chemistry: Secondary | ICD-10-CM

## 2010-10-31 LAB — CBC
HCT: 37.2 % (ref 36.0–46.0)
MCV: 87.7 fL (ref 78.0–100.0)
Platelets: 310 10*3/uL (ref 150–400)
RBC: 4.24 MIL/uL (ref 3.87–5.11)
WBC: 7.4 10*3/uL (ref 4.0–10.5)

## 2010-10-31 LAB — COMPREHENSIVE METABOLIC PANEL
AST: 143 U/L — ABNORMAL HIGH (ref 0–37)
Alkaline Phosphatase: 222 U/L — ABNORMAL HIGH (ref 39–117)
BUN: 8 mg/dL (ref 6–23)
CO2: 28 mEq/L (ref 19–32)
Chloride: 101 mEq/L (ref 96–112)
Creatinine, Ser: 0.73 mg/dL (ref 0.50–1.10)
GFR calc non Af Amer: >90 mL/min (ref >90)
Potassium: 3.8 mEq/L (ref 3.5–5.1)
Total Bilirubin: 0.5 mg/dL (ref 0.3–1.2)

## 2010-11-01 LAB — MRSA CULTURE

## 2010-11-01 NOTE — Op Note (Signed)
[name], [name] NO.:  [id number]  MEDICAL RECORD NO.:  [id number]  LOCATION:  1306                         FACILITY:  [facility]  PHYSICIAN:  [name], MDDATE OF BIRTH:  September 28, 1955  DATE OF PROCEDURE:  10/30/2010 DATE OF DISCHARGE:                              OPERATIVE REPORT   PREOPERATIVE DIAGNOSES: 1. Biliary dyskinesia. 2. Abnormal liver function tests.  POSTOPERATIVE DIAGNOSES: 1. Biliary dyskinesia. 2. Abnormal liver function tests.  PROCEDURE:  Laparoscopic cholecystectomy.  SURGEON:  [name]. Pamela Sarna, MD  ASSISTANT:  None.  ANESTHESIA:  General endotracheal.  ESTIMATED BLOOD LOSS:  Minimal.  COMPLICATIONS:  None.  DRAINS:  None.  DISPOSITION:  Recovery in stable condition.  INDICATIONS:  A 55 year old female who is otherwise healthy, who had some abdominal pain beginning in the first part of October.  This pain was in her upper abdomen.  She was evaluated and then she went to the emergency room where she was noted to have abnormal LFTs including bilirubin of 2.7.  Ultrasound showed no gallstones, no gallbladder wall thickening or pericholecystic fluid.  Her common bile duct was 5.2 mm. CT scan shows a mild periportal edema that was otherwise unchanged.  She then was readmitted for evaluation due to her elevated liver function tests which have started to come back to normal and bilirubin is normal. She underwent a repeat ultrasound as well as an MRCP which is otherwise fairly normal.  She has also undergone a HIDA scan that shows an ejection fraction of 8% with no pain with CCK infusion.  She also has undergone an EGD by Dr. Christella Hunter, which was benign.  She continues to have pain and I do not have a real reason why her LFTs were abnormal.  We all discussed proceeding with a laparoscopic cholecystectomy with an attempted cholangiogram due to her symptoms as well as her liver function tests and her abnormal HIDA scan.  We  did discuss preoperatively that this may not cure her pain.  PROCEDURE IN DETAIL:  After informed consent was obtained, the patient was taken to the operating.  She was administered 1.5 g of intravenous Unasyn.  Sequential compression devices were placed on her lower extremities prior to induction of anesthesia.  She was then placed under general endotracheal anesthesia without complication.  Her abdomen was prepped and draped in the standard sterile surgical fashion.  Surgical time-out was then performed.  I infiltrated 0.25% Marcaine below her umbilicus and made a vertical incision.  I carried this down to her umbilical stalk.  This was grasped.  Her fascia was entered with an 11 blade.  I then entered the peritoneum bluntly.  A 0 Vicryl pursestring suture was placed through the fascia.  Hasson trocar was introduced and the abdomen was then insufflated to 15 mmHg pressure.  I then inserted three further 5 mm trocars in the epigastrium and right upper quadrant after infiltration with local anesthetic under direct vision without complication.  I then grasped the gallbladder and retracted it cephalad.  She did have some scarring associated with her liver and her duodenum was adherent to her gallbladder.  These were cut away sharply.  I then  retracted her gallbladder lateral.  Her triangle of Calot had a fair amount of scarring present and I eventually was able to obtain a critical view of safety.  I did not end up performing a cholangiogram due to the fact that her right hepatic artery was very adherent to her duct and I really did not have enough length to dissect this off.  Then, I felt it was safe to do a cholangiogram.  She previously had an MRCP and I was comfortable with this.  I then clipped the duct, divided this.  I treated the artery in a similar fashion.  I then removed the gallbladder from the liver bed without difficulty.  I placed this in an Endocatch bag and removed it  from the umbilicus.  Hemostasis was obtained. Irrigation was performed.  I then removed the Hasson trocar.  I tied down this pursestring suture.  This completely obliterated the defect. I viewed this from the epigastric incision.  There was no evidence of any entry injury.  I then removed all trocars and desufflated the abdomen.  I closed this with 4-0 Monocryl and Dermabond.  She tolerated this well, was extubated, and transferred to the recovery room in stable condition.     Pamela Gosling, MD     MCW/MEDQ  D:  10/30/2010  T:  10/30/2010  Job:  [id number]  cc:   Pamela Fee, MD [address] [place], [place] [address]  Dr. Flossie Hunter  [name], M.D. Fax: [phone]  Electronically Signed by Pamela Loron MD on 11/01/2010 11:37:44 AM

## 2010-11-02 NOTE — Consult Note (Signed)
NAMEHERAN, CAMPAU NO.:  [id number]  MEDICAL RECORD NO.:  [id number]  LOCATION:  1306                         FACILITY:  [facility]  PHYSICIAN:  Juanetta Gosling, MDDATE OF BIRTH:  1955-06-09  DATE OF CONSULTATION:  10/28/2010 DATE OF DISCHARGE:                                CONSULTATION   REFERRING PHYSICIAN:  Rachael Fee, MD, Gastroenterology.  CARDIOLOGY:  Vesta Mixer, MD.  PRIMARY CARE:  Dr. Flossie Dibble.  REASON FOR CONSULTATION:  Abdominal pain.  BRIEF HISTORY:  The patient is a normally healthy 55 year old white female who had some abdominal pain after a chicken sandwich on October 15, 2010.  She says it was both on the right and left upper portion of her abdomen.  It lasted about 3 hours and got better.  She was fine up until October 22, 2010 when she started having pain again and ultimately went to the ER at [facility].  During that evaluation, she had LFTs, which showed a bilirubin of 2.7, alkaline phosphatase of 307, SGOT of 860, and SGPT of 799.  White count at that time was 5.5.  She then underwent abdominal ultrasound, which showed no gallstones, no gallbladder wall thickening or pericholecystic fluid.  The common bile duct was 5.2 mm.  The liver was unremarkable and it was deemed a normal ultrasound.  This was followed by a CT, which showed the liver was notable for some mild periportal edema, but no focal hepatic lesions. The gallbladder was not distended.  There is some mild fluid at that time, but no intrahepatic or extrahepatic ductal dilatation.  The patient was ultimately sent home after that.  She says she has had continuous discomfort since then.  It is relieved by a pain medication, but has continued.  She was then admitted to the hospital on October 27, 2010 by Dr. Leone Payor for further evaluation.  The patient continues to complain of discomfort.  LFTs on October 27, 2010, shows bilirubin is down to 1.2,  alkaline phosphatase 276, SGOT 189, and SGPT is 310.  White count is 4.1, hemoglobin is 12.6, hematocrit is 37, platelets are 358,000, and prothrombin time is 13.8 seconds.  Electrolytes are normal. BUN is 15 and creatinine is 0.94.  Lipase in this admission was also normal at 23.  A repeat ultrasound on October 22nd, shows no shadowing gallstones or echogenic sludge.  There is no gallbladder wall thickening.  No pericholecystic fluid.  Negative Murphy sign by the technologist.  Common bile duct was normal at approximately 3 mm. Again, the liver appears normal.  The echotexture is without focal parenchymal abnormality.  There is a patent portal vein with normal flow.  The patient then underwent a HIDA scan, which showed hepatic uptake is normal and visible within 10 minutes.  There is duodenal activity identified within 30 minutes.  Gallbladder activity is visualized at 15 minutes.  With normal filling of the gallbladder throughout, the remainder of the imaging sequences, there is mild tracer retention at 60 minutes.  The gallbladder ejection fraction was noted to be 8% at 30 minutes and the patient had no pain with CCK infusion.  The patient  continues to have abdominal pain and again it is still only relieved by pain medicines.  She says during the week prior to her admission where she was having pain, it was somewhat worse with food. She has had nausea, which has been mild, but no vomiting.  She also denies any diarrhea and she has actually been constipated since last Friday.  We were asked to see the patient in evaluation.  PAST MEDICAL HISTORY: 1. She has a history of dyslipidemia.  She has been on a statin drug     in the past.  She has been off this summer.  She recently saw Dr.     Elease Hashimoto and he called her with a report of elevated LFTs and these     were not started.  She also has hepatic liver function study from     October 17, 2010, which shows a total bilirubin of 0.5,  alkaline     phosphatase of 177, SGOT of 285, and SGPT of 323. 2. History of colonoscopy, February 2011, benign rectal polyps.  PAST SURGICAL HISTORY:  She had bladder surgery at age 36 and none since.  FAMILY HISTORY:  Mother is living, has had CABG, back surgery, and cholecystectomy.  She has a grandmother on her father's side who had gallbladder cancer.  Father died with a brain hemorrhage at 5.  No brothers and 1 sister in good health.  SOCIAL HISTORY:  Tobacco none.  Alcohol, social.  Drugs, none.  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  REVIEW OF SYSTEMS:  FEVER:  None.  SKIN:  No changes.  CEREBROVASCULAR: No history of stroke, seizure, or syncope.  PULMONARY:  She is extremely active.  She goes to the gym, plays tennis regularly without any shortness of breath or dyspnea on exertion.  CARDIAC:  No history of chest pain.  GI:  Negative for GERD.  She does notice a lot of belching over the last week or 2.  Some nausea, no vomiting except in the ER on October 23, 2010.  None since even with oral intake.  No diarrhea, some constipation since last Friday.  No blood in her stool.  GU:  No trouble voiding.  GYN:  She has not had a period for 2 years.  LOWER EXTREMITIES:  No edema.  No claudication.  MUSCULOSKELETAL:  No joint problems.  She is normally very active without problems.  HOME MEDICATIONS:  Occasional Advil, Zofran p.r.n., and oxycodone p.r.n. since October 23, 2010.  CURRENT HOSPITAL MEDICATIONS:  Unasyn IV, Dilaudid, and Zofran.  MEDICAL ALLERGIES:  None known.  PHYSICAL EXAMINATION:  GENERAL:  This is a well-nourished and well- developed, thin white female, in no acute distress, but complain of some ongoing abdominal discomfort. VITAL SIGNS:  Temperature is 98.8, heart rate is 53, blood pressure is 97/56, respiratory rate of 18, and saturation 97% on room air. HEAD:  Normocephalic. EARS, NOSE, THROAT, AND MOUTH:  Within normal limits. NECK:  Trachea is in the  midline.  There are no bruits, no JVD, no thyromegaly. CHEST:  Clear to auscultation and percussion.  Chest is nontender to palpation. CARDIAC:  Normal S1 and S2.  No murmurs, rubs, or gallops. ABDOMEN:  Bowel sounds are present.  She is not distended.  She does not complain of any significant tenderness on palpation.  She is slightly tender on the right side.  She describes the pain in her mid epigastric area to the right side.  There are no abscesses, masses, or  hernias. GU/RECTAL:  Deferred. LYMPHATIC:  Lymphadenopathy none palpated. MUSCULOSKELETAL:  No joint changes.  She is mobile without problems. SKIN:  No changes. NEUROLOGIC:  No focal changes.  Cranial nerves grossly intact. PSYCHIATRIC:  Normal affect.  LABORATORY DATA:  CBC, October 27, 2010, shows a white count of 4.1, hemoglobin 12.6, hematocrit 37, and platelets are 258,000.  CBC on October 23, 2010, shows a white count of 5.5, hemoglobin of 14.5, and hematocrit of 42 as noted above.  Total bilirubin is 0.9, alkaline phosphatase 241, SGOT 190, and SGPT is 286.  Lipase on October 27, 2010 was 23.  Total bilirubin on October 27, 2010 is 1.2.  Alkaline phosphatase 276, SGOT 189, and SGPT 310.  DIAGNOSTICS:  As listed above.  IMPRESSION: 1. Abdominal pain with elevated LFTs. 2. Abnormal HIDA scan with no evidence of cholecystitis by ultrasound     or CT. 3. Dyslipidemia, previously on statins, but none in some period of     time.  The patient is going to get a full liquid diet today.  We     are going to order an MRCP.  We will recheck her labs in the a.m.     and make her n.p.o. after midnight.  Final decision after the above     studies were obtained. PLANl:  Dr. Dwain Sarna discussed case with Dr. Christella Hartigan.  GI will plan EGD,  if that is negative we will discuss possible cholecystectomy althought this may not be curative of her symptoms.    Eber Hong, P.A.   ______________________________ Juanetta Gosling, MD    WDJ/MEDQ  D:  10/28/2010  T:  10/28/2010  Job:  [id number]  cc:   Vesta Mixer, M.D. Fax: [phone]  Rachael Fee, MD [address] [place], [place] [address]  Dr. __________  Juanetta Gosling, MD [address] [place] [address]  Electronically Signed by Sherrie George P.A. on 11/01/2010 62:13:08 PM Electronically Signed by Emelia Loron MD on 11/02/2010 08:07:56 PM

## 2010-11-06 ENCOUNTER — Telehealth: Payer: Self-pay | Admitting: Gastroenterology

## 2010-11-06 ENCOUNTER — Other Ambulatory Visit (INDEPENDENT_AMBULATORY_CARE_PROVIDER_SITE_OTHER): Payer: BC Managed Care – PPO

## 2010-11-06 DIAGNOSIS — R7989 Other specified abnormal findings of blood chemistry: Secondary | ICD-10-CM

## 2010-11-06 DIAGNOSIS — R1011 Right upper quadrant pain: Secondary | ICD-10-CM

## 2010-11-06 LAB — HEPATIC FUNCTION PANEL
ALT: 167 U/L — ABNORMAL HIGH (ref 0–35)
ALT: 168 U/L — ABNORMAL HIGH (ref 0–35)
AST: 116 U/L — ABNORMAL HIGH (ref 0–37)
Albumin: 3.9 g/dL (ref 3.5–5.2)
Bilirubin, Direct: 0.2 mg/dL (ref 0.0–0.3)
Bilirubin, Direct: 0.2 mg/dL (ref 0.0–0.3)
Total Protein: 7.1 g/dL (ref 6.0–8.3)
Total Protein: 7.2 g/dL (ref 6.0–8.3)

## 2010-11-06 NOTE — Telephone Encounter (Signed)
One week s/p cholecystectomy. Having frequent belching and mild constipation. Advised to take Prilosec 20 mg OTC daily for 1-2 weeks and Gaz-X qid as needed for 1-2 weeks.  Miralax daily until constipation resolves.

## 2010-11-07 ENCOUNTER — Telehealth (INDEPENDENT_AMBULATORY_CARE_PROVIDER_SITE_OTHER): Payer: Self-pay

## 2010-11-07 NOTE — Telephone Encounter (Signed)
Returned pt's message. The pt was wanting her path report which I notified her that it showed cholecystitis. The pt wants to know does this path report prove that it was the right decision to remove the gallbladder for her symptoms that were going on at the time before surgery. The pt was told that it was 50-70% that all of these problems were related to her gallbladder symtoms. The pt needs a follow up appt with Dr Dwain Sarna and I advised that I would call her back on Monday to schedule the appt./ [facility]

## 2010-11-10 ENCOUNTER — Other Ambulatory Visit (HOSPITAL_COMMUNITY): Payer: BC Managed Care – PPO

## 2010-11-10 ENCOUNTER — Ambulatory Visit (INDEPENDENT_AMBULATORY_CARE_PROVIDER_SITE_OTHER): Payer: BC Managed Care – PPO | Admitting: General Surgery

## 2010-11-10 ENCOUNTER — Encounter (INDEPENDENT_AMBULATORY_CARE_PROVIDER_SITE_OTHER): Payer: Self-pay | Admitting: General Surgery

## 2010-11-10 VITALS — BP 122/82 | HR 60 | Temp 98.2°F | Resp 20 | Ht 66.0 in | Wt 132.2 lb

## 2010-11-10 DIAGNOSIS — Z09 Encounter for follow-up examination after completed treatment for conditions other than malignant neoplasm: Secondary | ICD-10-CM

## 2010-11-10 NOTE — Progress Notes (Signed)
Subjective:     Patient ID: [name], female   DOB: [birth year], 55 y.o.   MRN: [id number]  HPI This is a 55 year old female who is now a couple weeks after her laparoscopic cholecystectomy. She had some upper GI symptoms as well some elevated liver function tests we thought might be referable to her gallbladder. We discussed at length beforehand that this might not help her symptoms. Her gallbladder did have scarring associated with it at the time of surgery her pathology returned as mild chronic cholecystitis. She comes in today stating that much of her pain is now better but she is having some significant eructation issues. She is having some upper abdominal discomfort as well. She has no nausea or vomiting. She is passing flatus and is having bowel movements now also. She denies any fevers. She is eating but is not able to eat a large meal at this point.  Review of Systems     Objective:   Physical Exam Well healed incisions without infection, soft, nontender    Assessment:     S/p lap chole    Plan:     I'm still hopeful that removing her gallbladder was pain. I can't really explain her current symptoms. I discussed this with Dr. Rowland Lathe. Her liver function tests were normal to decreasing to normal 4 days ago now. I am going to obtain a CT scan the nature that there is not a trocar site hernia or a fluid collection is causing her current symptoms. I don't think that is the case the nature with a CT scan.

## 2010-11-11 ENCOUNTER — Ambulatory Visit
Admission: RE | Admit: 2010-11-11 | Discharge: 2010-11-11 | Disposition: A | Discharge status: Home / Self Care | Payer: BC Managed Care – PPO | Source: Ambulatory Visit | Attending: General Surgery | Admitting: General Surgery

## 2010-11-11 ENCOUNTER — Other Ambulatory Visit: Payer: BC Managed Care – PPO | Admitting: *Deleted

## 2010-11-11 ENCOUNTER — Telehealth (INDEPENDENT_AMBULATORY_CARE_PROVIDER_SITE_OTHER): Payer: Self-pay

## 2010-11-11 DIAGNOSIS — Z09 Encounter for follow-up examination after completed treatment for conditions other than malignant neoplasm: Secondary | ICD-10-CM

## 2010-11-11 DIAGNOSIS — Z9049 Acquired absence of other specified parts of digestive tract: Secondary | ICD-10-CM

## 2010-11-11 MED ORDER — IOHEXOL 300 MG/ML  SOLN: 100.0000 mL | Freq: Once | INTRAMUSCULAR | Status: AC | PRN

## 2010-11-11 MED ORDER — TRAMADOL HCL 50 MG PO TABS: 50.0000 mg | ORAL_TABLET | Freq: Four times a day (QID) | ORAL | Status: DC | PRN

## 2010-11-11 NOTE — Telephone Encounter (Signed)
Pt notified of her CT scan being normal with no signs of abscess or fluid collections. Pt will follow up with Dr Russella Dar on 11-12-10./ [facility]

## 2010-11-12 ENCOUNTER — Ambulatory Visit (INDEPENDENT_AMBULATORY_CARE_PROVIDER_SITE_OTHER): Payer: BC Managed Care – PPO | Admitting: Gastroenterology

## 2010-11-12 ENCOUNTER — Encounter: Payer: Self-pay | Admitting: Gastroenterology

## 2010-11-12 VITALS — BP 112/60 | HR 72 | Ht 66.0 in | Wt 133.0 lb

## 2010-11-12 DIAGNOSIS — K219 Gastro-esophageal reflux disease without esophagitis: Secondary | ICD-10-CM | POA: Insufficient documentation

## 2010-11-12 DIAGNOSIS — R066 Hiccough: Secondary | ICD-10-CM

## 2010-11-12 DIAGNOSIS — R7401 Elevation of levels of liver transaminase levels: Secondary | ICD-10-CM | POA: Insufficient documentation

## 2010-11-12 MED ORDER — BACLOFEN 10 MG PO TABS: 10.0000 mg | ORAL_TABLET | Freq: Three times a day (TID) | ORAL | Status: AC

## 2010-11-12 MED ORDER — DEXLANSOPRAZOLE 60 MG PO CPDR: 60.0000 mg | DELAYED_RELEASE_CAPSULE | Freq: Every day | ORAL | Status: AC

## 2010-11-12 NOTE — Progress Notes (Signed)
History of Present Illness: This is a 55 year old female here today with her husband. She is 2 weeks status post laparoscopic cholecystectomy for chronic cholecystitis. She developed frequent and intractable belching and hiccups for the past several days associated with a pressure-like sensation in her upper abdomen and chest. Prior to this she was having an uneventful surgical recovery. She was started on Gas-X and omeprazole without improvement in symptoms. She was evaluated by Dr. Dwain Sarna underwent a CT scan of the abdomen and pelvis yesterday to rule out a postoperative complication and that study was negative. Denies weight loss, constipation, diarrhea, change in stool caliber, melena, hematochezia, nausea, vomiting, dysphagia, reflux symptoms, chest pain.  Current Medications, Allergies, Past Medical History, Past Surgical History, Family History and Social History were reviewed in [employer] record.  Physical Exam: General: Well developed , well nourished, frequently belching and hiccuping Head: Normocephalic and atraumatic Eyes:  sclerae anicteric, EOMI Ears: Normal auditory acuity Mouth: No deformity or lesions Lungs: Clear throughout to auscultation Heart: Regular rate and rhythm; no murmurs, rubs or bruits Abdomen: Soft, non tender and non distended. No masses, hepatosplenomegaly or hernias noted. Normal Bowel sounds Musculoskeletal: Symmetrical with no gross deformities  Neurological: Alert oriented x 4, grossly nonfocal Psychological:  Alert and cooperative. Normal mood and affect  Assessment and Recommendations:  1. Belching and hiccuping. Rule out GERD, esophagitis or diaphragmatic irritation. Begin baclofen 10 mg 3 times a day. Increase reflux therapy to include all standard antireflux measures and Dexilant 60 mg daily. Discontinue omeprazole. If her symptoms do not respond consider changing baclofen to chlorpromazine or metoclopramide.  2. Elevated  liver function tests felt secondary to chronic cholecystitis. Repeat liver function tests in 2-3 weeks.  3. Status post laparoscopic cholecystectomy.

## 2010-11-12 NOTE — Patient Instructions (Addendum)
Discontinue Prilosec and start Dexilant one tablet by mouth once daily. A prescription has been sent to your pharmacy.  We have also sent Baclofen to your pharmacy for belching, bloating. Patient advised to avoid spicy, acidic, citrus, chocolate, mints, fruit and fruit juices.  Limit the intake of caffeine, alcohol and Soda.  Don't exercise too soon after eating.  Don't lie down within 3-4 hours of eating.  Elevate the head of your bed. cc: Jarome Matin, MD       Emelia Loron, MD

## 2010-11-14 ENCOUNTER — Encounter: Payer: Self-pay | Admitting: Cardiovascular Disease

## 2010-11-14 ENCOUNTER — Telehealth (INDEPENDENT_AMBULATORY_CARE_PROVIDER_SITE_OTHER): Payer: Self-pay | Admitting: General Surgery

## 2010-11-14 NOTE — Telephone Encounter (Signed)
Called pt to advised her that it's ok to exercise per Dr Dwain Sarna.[name]

## 2010-11-16 ENCOUNTER — Telehealth: Payer: Self-pay | Admitting: Gastroenterology

## 2010-11-16 NOTE — Telephone Encounter (Signed)
Contacted by pt at 1000 for feeling loopy/tired and having a vibrating/waffling sound in ears and burping that all started after a football game and party yesterday. Symptoms persist today. Burping was much better until yesterday. Advised to stop Baclofen and Ultram. Resume Ultram in 1-2 days only if having pain not relieved by Tylenol or Advil. Contact us if burping returns. Rest more for the next few days.

## 2010-11-18 ENCOUNTER — Telehealth: Payer: Self-pay | Admitting: Gastroenterology

## 2010-11-18 ENCOUNTER — Ambulatory Visit: Payer: Self-pay | Admitting: Cardiovascular Disease

## 2010-11-18 NOTE — Telephone Encounter (Signed)
I reviewed your note. I am sorry that she is feeling poorly. I am happy to see her today in the office if she desires but I understand she is transferring her care to [facility].

## 2010-11-18 NOTE — Telephone Encounter (Signed)
FYI:  Patient's husband called demanding that his wife's records be transferred ASAP to [facility], that his wife was going there for a second opinion.   I told him that I would call down to Medical Records and see what was involved and I would call him back.  His wife then got on the phone and was crying and upset saying that she was in bad shape and that she was worse since talking to you on Sunday morning.  I asked her if she had called and spoke with the nurse in regards to her symptoms or if she wanted to speak to her now and she did not.  She said that she had to get better and she would deal with that later.  That you had told her to stop the Baclofen and she had learned that you should not stop Baclofen suddenly.  She gave the phone back to her husband and I told him I would call him back in 10 minutes.  I called down to Medical Records to see what was involved in releasing records and called the husband back.  I told the husband that I would transfer him to Medical Records and they would help him.

## 2010-11-26 NOTE — H&P (Signed)
[name], [name]                 ACCOUNT NO.:  [id number]  MEDICAL RECORD NO.:  [id number]  LOCATION:  1306                         FACILITY:  [facility]  PHYSICIAN:  Iva Boop, MD,FACGDATE OF BIRTH:  03/19/1955  DATE OF ADMISSION:  10/27/2010 DATE OF DISCHARGE:  10/31/2010                             HISTORY & PHYSICAL   PRIMARY GASTROENTEROLOGIST:  Dr. Judie Petit T. Russella Dar, MD, Clement[facility] Graham.  HISTORY OF PRESENT ILLNESS:  [name] is a 55 year old female who presented to the emergency department on October 23, 2010, with acute epigastric pain and abnormal LFTs of a mixed pattern.  Her ultrasound was negative.  CT scan showed mild pericholecystic fluid with mild periportal edema, but otherwise scan was negative.  The patient went home from the emergency department as she felt better, but over the weekend spoke with Dr. Russella Dar because of worsening epigastric pain and possibly jaundice.  Her epigastric pain is constant if she is not taking pain medications.  It is non radiating.  There is associated nausea and vomiting.  The patient had the same pain about 12 days ago, but it got better.  Again, the pain is constant, it is not intermittent.  Last night, she also had an unusual type of pain, she described it as a shooting pain from her pelvic area straight up to her neck.  Previous endoscopy, colonoscopy by Dr. Russella Dar in February, 2011, for screening. 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  PAST MEDICAL HISTORY: 1. Tubulovillous adenomatous colon polyp. 2. Hyperlipidemia.  PAST SURGICAL HISTORY:  Bladder surgery at age 64.  FAMILY MEDICAL HISTORY:  Gallbladder disease in mother.  Gallbladder cancer in grandmother.  No colorectal cancer.  No liver disease.  SOCIAL HISTORY:  Nonsmoker, 1 alcoholic drink a month.  MEDICATIONS:  Occasional Advil, Zofran as needed for nausea, oxycodone 5 mg as needed for pain (since October 23, 2010).  ALLERGIES:  No known drug  allergies.  LABORATORY STUDIES:  Hepatitis A, B and C serologies negative.  WBC 5.5, total bilirubin 2.7, alkaline phosphatase 299, AST 860, ALT 784.  REVIEW OF SYSTEMS:  Low-grade temp for 5 days.  Chest pain (in conjunction with her epigastric pain a few days ago) now resolved. Intermittent shortness of breath.  Intermittent dizziness.  Review of systems is otherwise negative, unless noted in the HPI.  PHYSICAL EXAMINATION:  VITAL SIGNS:  Temperature 97.5, heart rate 57, respirations 18, blood pressure 107/70, O2 saturation 98% on room air. GENERAL:  [name] is a 55 year old white female, well developed, in no acute distress. HEENT:  Conjunctivae pink, no icterus. CARDIAC:  Regular rate and rhythm. RESPIRATORY:  Clear to auscultation bilaterally. ABDOMEN:  Soft, nondistended, mild diffuse upper abdominal tenderness, no masses felt. EXTREMITIES:  No edema. SKIN:  No significant lesion seen. NEUROLOGICAL:  Alert and oriented. PSYCHOLOGICAL:  Pleasant, cooperative.  IMPRESSION/PLAN:  A 55 year old white female with acute abdominal pain, nausea, vomiting, and elevated LFTs of mixed pattern.  No ductal dilatation or gallstones on ultrasound, but CT scan on October 23, 2010, shows mild periportal edema and mild pericholecystic fluid.  Acute viral hepatitis studies are negative.  We will admit for further workup and evaluation to include  a repeat ultrasound, repeat LFTs.  Her labs are not typical, but do need to exclude cholecystitis.  We will obtain a HIDA scan as well.  Surgery will be asked to evaluate the patient. Further recommendations pending above.     Willette Cluster, NP   ______________________________ Iva Boop, MD,FACG    PG/MEDQ  D:  11/07/2010  T:  11/07/2010  Job:  [id number]

## 2011-01-08 ENCOUNTER — Telehealth: Payer: Self-pay | Admitting: Cardiovascular Disease

## 2011-01-08 NOTE — Telephone Encounter (Signed)
New Msg: Pt calling wanting to talk to nurse about seeing Dr. Elease Hashimoto right away. Please return pt call to discuss further.

## 2011-01-08 NOTE — Telephone Encounter (Signed)
App made, pt happy with date.

## 2011-01-27 ENCOUNTER — Ambulatory Visit (INDEPENDENT_AMBULATORY_CARE_PROVIDER_SITE_OTHER): Payer: BC Managed Care – PPO | Admitting: *Deleted

## 2011-01-27 DIAGNOSIS — E7800 Pure hypercholesterolemia, unspecified: Secondary | ICD-10-CM

## 2011-01-27 LAB — BASIC METABOLIC PANEL
BUN: 20 mg/dL (ref 6–23)
CO2: 29 mEq/L (ref 19–32)
Chloride: 104 mEq/L (ref 96–112)
Creatinine, Ser: 1 mg/dL (ref 0.4–1.2)
Glucose, Bld: 89 mg/dL (ref 70–99)

## 2011-01-27 LAB — HEPATIC FUNCTION PANEL
Alkaline Phosphatase: 94 U/L (ref 39–117)
Bilirubin, Direct: 0.1 mg/dL (ref 0.0–0.3)
Total Bilirubin: 0.5 mg/dL (ref 0.3–1.2)

## 2011-01-29 ENCOUNTER — Encounter: Payer: Self-pay | Admitting: Cardiovascular Disease

## 2011-01-29 ENCOUNTER — Ambulatory Visit (INDEPENDENT_AMBULATORY_CARE_PROVIDER_SITE_OTHER): Payer: BC Managed Care – PPO | Admitting: Cardiovascular Disease

## 2011-01-29 VITALS — BP 119/78 | HR 64 | Ht 66.0 in | Wt 137.8 lb

## 2011-01-29 DIAGNOSIS — E785 Hyperlipidemia, unspecified: Secondary | ICD-10-CM | POA: Insufficient documentation

## 2011-01-29 MED ORDER — ATORVASTATIN CALCIUM 40 MG PO TABS: 40.0000 mg | ORAL_TABLET | Freq: Every day | ORAL | Status: DC

## 2011-01-29 NOTE — Patient Instructions (Signed)
Your physician wants you to follow-up in: 6 months You will receive a reminder letter in the mail two months in advance. If you don't receive a letter, please call our office to schedule the follow-up appointment.   Your physician recommends that you return for a FASTING lipid profile: 3 months  And in 6 months

## 2011-01-29 NOTE — Assessment & Plan Note (Signed)
Schedule her presents today for followup visit. She has not been taking her statin because of some abdominal pain and reflux. Has history of hypercholesterolemia and has a strong family history of coronary artery disease-her mother had coronary artery bypass grafting at a young age.  We'll start on Lipitor 40 mg a day. Our goal LDL is between 70 and 100. We'll check her lipids again in 3 months. I'll see her again for an office visit and fasting labs in 6 weeks.  She admits to eating a little bit of extra deserts. We talked about decreasing her intake of foods desserts and other sweet foods.

## 2011-01-29 NOTE — Progress Notes (Signed)
    Rowe Pavy Date of Birth  [birth year] [facility]     [place] Office  [address]    [address]   [address] [place], [place]  [address]    [place], [place]  [address] [phone]  Fax  [phone]  [phone]  Fax [phone]   History of Present Illness:  Problem List: 1. Hypercholesterolemia 2. Family Hx of CAD  Cyniah is doing well.  In October she had an acute episode of belly pain and was diagnosed with cholecystitis. She had a cholecystectomy. She's not been on her statin since that time.  He's been quite active. She's back playing tennis and is doing well. She denies any chest pain or shortness breath.  No current outpatient prescriptions on file prior to visit.    No Known Allergies  Past Medical History  Diagnosis Date  . Hyperlipidemia   . Diarrhea   . Abdominal distention   . Burping     excessive burping   . 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 GERD (gastroesophageal reflux disease)     Past Surgical History  Procedure Date  . Cholecystectomy 2012    laparoscopic  . Bladder surgery   . US echocardiography 11/03/2006    EF 55-60%    History  Smoking status  . Never Smoker   Smokeless tobacco  . Never Used    History  Alcohol Use No    Family History  Problem Relation Age of Onset  . Colon cancer Neg Hx   . Cancer Paternal Grandmother     Gallbladder  . Heart disease Mother   . Hypertension Mother   . Hypertension Father     Reviw of Systems:  Reviewed in the HPI.  All other systems are negative.  Physical Exam: BP 119/78  Pulse 64  Ht 5\' 6"  (1.676 m)  Wt 137 lb 12.8 oz (62.506 kg)  BMI 22.24 kg/m2 The patient is alert and oriented x 3.  The mood and affect are normal.   Skin: warm and dry.  Color is normal.    HEENT:   Normocephalic/atraumatic. Mucous membranes are moist. Neck is supple  Lungs: Lungs are clear to auscultation.   Heart: Regular rate S1-S2. She has no  murmurs.    Abdomen: Is good bowel sounds. Her laparoscopy scars well healed.  Extremities:  No clubbing cyanosis or edema. She has no palpable cords.  Neuro:  There exam is nonfocal. Her gait is normal.    ECG:  Assessment / Plan:

## 2011-02-02 ENCOUNTER — Emergency Department (HOSPITAL_COMMUNITY)
Admission: EM | Admit: 2011-02-02 | Discharge: 2011-02-02 | Disposition: A | Discharge status: Home / Self Care | Payer: BC Managed Care – PPO | Attending: Emergency Medicine | Admitting: Emergency Medicine

## 2011-02-02 ENCOUNTER — Encounter (HOSPITAL_COMMUNITY): Payer: Self-pay | Admitting: Emergency Medicine

## 2011-02-02 ENCOUNTER — Other Ambulatory Visit: Payer: Self-pay

## 2011-02-02 ENCOUNTER — Emergency Department (HOSPITAL_COMMUNITY): Payer: BC Managed Care – PPO

## 2011-02-02 DIAGNOSIS — Z9089 Acquired absence of other organs: Secondary | ICD-10-CM | POA: Insufficient documentation

## 2011-02-02 DIAGNOSIS — R109 Unspecified abdominal pain: Secondary | ICD-10-CM | POA: Insufficient documentation

## 2011-02-02 DIAGNOSIS — E785 Hyperlipidemia, unspecified: Secondary | ICD-10-CM | POA: Insufficient documentation

## 2011-02-02 LAB — COMPREHENSIVE METABOLIC PANEL
AST: 44 U/L — ABNORMAL HIGH (ref 0–37)
BUN: 17 mg/dL (ref 6–23)
CO2: 26 mEq/L (ref 19–32)
Calcium: 9.7 mg/dL (ref 8.4–10.5)
Chloride: 101 mEq/L (ref 96–112)
Creatinine, Ser: 0.74 mg/dL (ref 0.50–1.10)
GFR calc Af Amer: >90 mL/min (ref >90)
GFR calc non Af Amer: >90 mL/min (ref >90)
Glucose, Bld: 93 mg/dL (ref 70–99)
Total Bilirubin: 0.9 mg/dL (ref 0.3–1.2)

## 2011-02-02 LAB — DIFFERENTIAL
Eosinophils Relative: 2 % (ref 0–5)
Lymphocytes Relative: 6 % — ABNORMAL LOW (ref 12–46)
Lymphs Abs: 0.4 10*3/uL — ABNORMAL LOW (ref 0.7–4.0)
Monocytes Absolute: 0.5 10*3/uL (ref 0.1–1.0)
Monocytes Relative: 7 % (ref 3–12)
Neutro Abs: 6 10*3/uL (ref 1.7–7.7)

## 2011-02-02 LAB — CBC
HCT: 41.1 % (ref 36.0–46.0)
Hemoglobin: 13.8 g/dL (ref 12.0–15.0)
MCV: 86.3 fL (ref 78.0–100.0)
WBC: 7 10*3/uL (ref 4.0–10.5)

## 2011-02-02 LAB — TROPONIN I: Troponin I: <0.3 ng/mL (ref <0.30)

## 2011-02-02 LAB — LIPASE, BLOOD: Lipase: 24 U/L (ref 11–59)

## 2011-02-02 MED ORDER — OXYCODONE-ACETAMINOPHEN 5-325 MG PO TABS
1.0000 | ORAL_TABLET | Freq: Once | ORAL | Status: AC
  Administered 2011-02-02: 1 via ORAL
  Filled 2011-02-02: qty 1

## 2011-02-02 MED ORDER — FENTANYL CITRATE 0.05 MG/ML IJ SOLN
50.0000 ug | Freq: Once | INTRAMUSCULAR | Status: AC
  Administered 2011-02-02: 50 ug via INTRAVENOUS
  Filled 2011-02-02: qty 2

## 2011-02-02 NOTE — ED Notes (Signed)
Patient states pain began this morning pain was constant and 9 of 10 called spouse back home to take her to hospital, pain subsided when he got home, patient took a nap and when she woke and started about her day the pain came on again from 0 pain to 9 all at once. Patient has also had some nausea, with pain.

## 2011-02-02 NOTE — ED Provider Notes (Signed)
History     CSN: [id number]  Arrival date & time 02/02/11  1457   First MD Initiated Contact with Patient 02/02/11 1543      Chief Complaint  Patient presents with  . Abdominal Pain    (Consider location/radiation/quality/duration/timing/severity/associated sxs/prior treatment) Patient is a 55 y.o. female presenting with abdominal pain. The history is provided by the patient.  Abdominal Pain The primary symptoms of the illness include abdominal pain. The primary symptoms of the illness do not include shortness of breath, nausea, vomiting or diarrhea.  Symptoms associated with the illness do not include back pain.   patient has had upper abdominal pain 3 times today. The first episode started this morning and last about 15 minutes. It was severe. She had some burping with it. Some nausea without vomiting. No diarrhea constipation. She has had her gallbladder out for chronic cholecystitis fairly recently. She had a CT scan after for continued burping. No fevers. She's also had another episode of this pain later this morning. This lasted about 20 minutes it was less severe. Also prior to arrival and during arrival she had severe upper abdominal pain again. It has since resolved. She states she called her gastroenterologist told her to the ER. No fevers.  Past Medical History  Diagnosis Date  . Hyperlipidemia   . Diarrhea   . Abdominal distention   . Burping     excessive burping   . 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 GERD (gastroesophageal reflux disease)     Past Surgical History  Procedure Date  . Cholecystectomy 2012    laparoscopic  . Bladder surgery   . US echocardiography 11/03/2006    EF 55-60%    Family History  Problem Relation Age of Onset  . Colon cancer Neg Hx   . Cancer Paternal Grandmother     Gallbladder  . Heart disease Mother   . Hypertension Mother   . Hypertension Father     History  Substance Use Topics  . Smoking  status: Never Smoker   . Smokeless tobacco: Never Used  . Alcohol Use: No    OB History    Grav Para Term Preterm Abortions TAB SAB Ect Mult Living                  Review of Systems  Constitutional: Negative for activity change and appetite change.  HENT: Negative for neck stiffness.   Eyes: Negative for pain.  Respiratory: Negative for chest tightness and shortness of breath.   Cardiovascular: Negative for chest pain and leg swelling.  Gastrointestinal: Positive for abdominal pain. Negative for nausea, vomiting and diarrhea.  Genitourinary: Negative for flank pain.  Musculoskeletal: Negative for back pain.  Skin: Negative for rash.  Neurological: Negative for weakness, numbness and headaches.  Psychiatric/Behavioral: Negative for behavioral problems.    Allergies  Review of patient's allergies indicates no known allergies.  Home Medications   Current Outpatient Rx  Name Route Sig Dispense Refill  . ATORVASTATIN CALCIUM 40 MG PO TABS Oral Take 1 tablet (40 mg total) by mouth daily. 30 tablet 11    BP 132/82  Pulse 70  Temp(Src) 97.3 F (36.3 C) (Oral)  Resp 16  SpO2 100%  Physical Exam  Nursing note and vitals reviewed. Constitutional: She is oriented to person, place, and time. She appears well-developed and well-nourished.  HENT:  Head: Normocephalic and atraumatic.  Eyes: EOM are normal. Pupils are equal, round, and reactive to  light.  Neck: Normal range of motion. Neck supple.  Cardiovascular: Normal rate, regular rhythm and normal heart sounds.   No murmur heard. Pulmonary/Chest: Effort normal and breath sounds normal. No respiratory distress. She has no wheezes. She has no rales.  Abdominal: Soft. Bowel sounds are normal. She exhibits no distension. There is no tenderness. There is no rebound and no guarding.  Musculoskeletal: Normal range of motion.  Neurological: She is alert and oriented to person, place, and time. No cranial nerve deficit.  Skin: Skin  is warm and dry.  Psychiatric: She has a normal mood and affect. Her speech is normal.    ED Course  Procedures (including critical care time)   Labs Reviewed  CBC  DIFFERENTIAL  COMPREHENSIVE METABOLIC PANEL  LIPASE, BLOOD  TROPONIN I   No results found.   No diagnosis found.   Date: 02/02/2011  Rate: 62  Rhythm: normal sinus rhythm  QRS Axis: normal  Intervals: normal  ST/T Wave abnormalities: normal  Conduction Disutrbances:none  Narrative Interpretation:   Old EKG Reviewed: unchanged    MDM  Upper abdominal cramping pain. She's had 4 episodes of the. Let us reassuring. Her LFTs are slightly elevated, but not as elevated as they have been previously. She had a cholecystectomy a few months ago. She feels better after treatment and pain medication. I discussed case with Dr. Evette Cristal. The abdominal oral trial here which is tolerated. She'll followup in the clinic in a day or 2.        Juliet Rude. Rubin Payor, MD 02/02/11 2057

## 2011-02-02 NOTE — ED Notes (Signed)
Pt ambulated to bathroom without assistance 

## 2011-02-02 NOTE — ED Notes (Signed)
Patient screaming in pain after eating crackers and gingerale. Pain 10/10.

## 2011-02-06 ENCOUNTER — Other Ambulatory Visit: Payer: Self-pay | Admitting: Gastroenterology

## 2011-02-06 DIAGNOSIS — R7989 Other specified abnormal findings of blood chemistry: Secondary | ICD-10-CM

## 2011-02-09 ENCOUNTER — Ambulatory Visit
Admission: RE | Admit: 2011-02-09 | Discharge: 2011-02-09 | Disposition: A | Discharge status: Home / Self Care | Payer: BC Managed Care – PPO | Source: Ambulatory Visit | Attending: Gastroenterology | Admitting: Gastroenterology

## 2011-02-09 DIAGNOSIS — R7989 Other specified abnormal findings of blood chemistry: Secondary | ICD-10-CM

## 2011-02-11 ENCOUNTER — Encounter (HOSPITAL_COMMUNITY): Payer: Self-pay | Admitting: Anesthesiology

## 2011-02-11 ENCOUNTER — Ambulatory Visit (HOSPITAL_COMMUNITY)
Admission: RE | Admit: 2011-02-11 | Discharge: 2011-02-11 | Disposition: A | Discharge status: Home / Self Care | Payer: BC Managed Care – PPO | Source: Ambulatory Visit | Attending: Gastroenterology | Admitting: Gastroenterology

## 2011-02-11 ENCOUNTER — Ambulatory Visit (HOSPITAL_COMMUNITY): Payer: BC Managed Care – PPO | Admitting: Anesthesiology

## 2011-02-11 ENCOUNTER — Encounter (HOSPITAL_COMMUNITY)
Admission: RE | Disposition: A | Discharge status: Home / Self Care | Payer: Self-pay | Source: Ambulatory Visit | Attending: Gastroenterology

## 2011-02-11 DIAGNOSIS — Z9089 Acquired absence of other organs: Secondary | ICD-10-CM | POA: Insufficient documentation

## 2011-02-11 DIAGNOSIS — R7989 Other specified abnormal findings of blood chemistry: Secondary | ICD-10-CM | POA: Insufficient documentation

## 2011-02-11 DIAGNOSIS — E785 Hyperlipidemia, unspecified: Secondary | ICD-10-CM | POA: Insufficient documentation

## 2011-02-11 DIAGNOSIS — R109 Unspecified abdominal pain: Secondary | ICD-10-CM | POA: Insufficient documentation

## 2011-02-11 HISTORY — PX: EUS: SHX5427

## 2011-02-11 SURGERY — UPPER ENDOSCOPIC ULTRASOUND (EUS) LINEAR
Anesthesia: Monitor Anesthesia Care

## 2011-02-11 MED ORDER — LACTATED RINGERS IV SOLN
INTRAVENOUS | Status: DC | PRN
  Administered 2011-02-11: 12:00:00 via INTRAVENOUS

## 2011-02-11 MED ORDER — FENTANYL CITRATE 0.05 MG/ML IJ SOLN: 25.0000 ug | INTRAMUSCULAR | Status: DC | PRN

## 2011-02-11 MED ORDER — PROMETHAZINE HCL 25 MG/ML IJ SOLN: 6.2500 mg | INTRAMUSCULAR | Status: DC | PRN

## 2011-02-11 MED ORDER — PROPOFOL 10 MG/ML IV EMUL
INTRAVENOUS | Status: DC | PRN
  Administered 2011-02-11: 50 ug/kg/min via INTRAVENOUS

## 2011-02-11 MED ORDER — BUTAMBEN-TETRACAINE-BENZOCAINE 2-2-14 % EX AERO
INHALATION_SPRAY | CUTANEOUS | Status: DC | PRN
  Administered 2011-02-11: 2 via TOPICAL

## 2011-02-11 MED ORDER — MIDAZOLAM HCL 5 MG/5ML IJ SOLN
INTRAMUSCULAR | Status: DC | PRN
  Administered 2011-02-11: 2 mg via INTRAVENOUS

## 2011-02-11 MED ORDER — LACTATED RINGERS IV SOLN
INTRAVENOUS | Status: DC
  Administered 2011-02-11: 12:00:00 via INTRAVENOUS

## 2011-02-11 MED ORDER — FENTANYL CITRATE 0.05 MG/ML IJ SOLN
INTRAMUSCULAR | Status: DC | PRN
  Administered 2011-02-11: 50 ug via INTRAVENOUS

## 2011-02-11 NOTE — Transfer of Care (Signed)
Immediate Anesthesia Transfer of Care Note  Patient: [name]  Procedure(s) Performed:  UPPER ENDOSCOPIC ULTRASOUND (EUS) LINEAR - mac  Patient Location: PACU  Anesthesia Type: MAC  Level of Consciousness: sedated, patient cooperative and responds to stimulation  Airway & Oxygen Therapy: Patient Spontanous Breathing and Patient connected to nasal cannula oxygen  Post-op Assessment: Report given to PACU RN, Post -op Vital signs reviewed and stable and Patient moving all extremities  Post vital signs: Reviewed and stable  Complications: No apparent anesthesia complications

## 2011-02-11 NOTE — H&P (Signed)
Patient interval history reviewed.  Patient examined again.  There has been no change from documented H/P dated 02/06/11 (scanned into chart from our office) except as documented above.

## 2011-02-11 NOTE — Preoperative (Signed)
Beta Blockers   Reason not to administer Beta Blockers:Not Applicable 

## 2011-02-11 NOTE — Op Note (Signed)
[facility] [address] [place], [place]  [address]  ENDOSCOPIC ULTRASOUND PROCEDURE REPORT  PATIENT:  [name], [name]  MR#:  [id number] BIRTHDATE:  12-17-1955  GENDER:  female  ENDOSCOPIST:  Willis Modena, MD REFERRED BY:  Bernette Redbird, M.D.  PROCEDURE DATE:  02/11/2011 PROCEDURE:  Upper EUS ASA CLASS:  Class I INDICATIONS:  abdominal pain (resolved);elevated LFTs; post-cholecystectomy  MEDICATIONS:   Cetacaine spray x 2, MAC sedation, administered by CRNA  DESCRIPTION OF PROCEDURE:   After the risks benefits and alternatives of the procedure were  explained, informed consent was obtained. The patient was then placed in the left, lateral, decubitus postion and IV sedation was administered. Throughout the procedure, the patient's blood pressure, pulse and oxygen saturations were monitored continuously.  Under direct visualization, the 110036 endoscope was introduced through the mouth and advanced to the second portion of the duodenum.  Water was used as necessary to provide an acoustic interface.  Upon completion of the imaging, water was removed and the patient was sent to the recovery room in satisfactory condition.  <<PROCEDUREIMAGES>>  FINDINGS:  Pancreas is a bit heterogenous (hyperechoic strands and foci), but no mass was identified.  Post-cholecystectomy  anatomy identified.  Extrahepatic bile duct is about 4mm in size, with no bile duct stones or wall thickening.  Normal-appearing ampulla via EUS.  ENDOSCOPIC IMPRESSION:    1.  Normal-appearing pancreas and ampulla 2.  Post-cholecystectomy. 3.  Non-dilated normal-appearing bile duct; no wall thickening or bile duct stones identified.  RECOMMENDATIONS:      1.  Watch for potential complications of procedure. 2.  I suspect had bile duct stone that has subsequently passed. 3.  When/if symptoms recur, in setting of marked liver test elevation, consider ERCP with             sphincterotomy  for possible Sphincter of Oddi dysfunction.  ______________________________ Willis Modena  CC:  n. eSIGNEDWillis Modena at 02/11/2011 01:04 PM  Rowe Pavy, [phone]

## 2011-02-11 NOTE — Anesthesia Preprocedure Evaluation (Addendum)
Anesthesia Evaluation  Patient identified by MRN, date of birth, ID band Patient awake    Reviewed: Allergy & Precautions, H&P , NPO status , Patient's Chart, lab work & pertinent test results, reviewed documented beta blocker date and time   Airway Mallampati: II TM Distance: >3 FB Neck ROM: full    Dental No notable dental hx. (+) Teeth Intact, Caps, Implants and Dental Advidsory Given   Pulmonary neg pulmonary ROS,  clear to auscultation  Pulmonary exam normal       Cardiovascular Exercise Tolerance: Good neg cardio ROS regular Normal    Neuro/Psych Negative Neurological ROS  Negative Psych ROS   GI/Hepatic negative GI ROS, Neg liver ROS, GERD-  Medicated,  Endo/Other  Negative Endocrine ROS  Renal/GU negative Renal ROS  Genitourinary negative   Musculoskeletal   Abdominal   Peds  Hematology negative hematology ROS (+)   Anesthesia Other Findings   Reproductive/Obstetrics negative OB ROS                           Anesthesia Physical Anesthesia Plan  ASA: I  Anesthesia Plan: MAC   Post-op Pain Management:    Induction:   Airway Management Planned:   Additional Equipment:   Intra-op Plan:   Post-operative Plan:   Informed Consent: I have reviewed the patients History and Physical, chart, labs and discussed the procedure including the risks, benefits and alternatives for the proposed anesthesia with the patient or authorized representative who has indicated his/her understanding and acceptance.   Dental Advisory Given  Plan Discussed with: CRNA  Anesthesia Plan Comments:         Anesthesia Quick Evaluation

## 2011-02-11 NOTE — Brief Op Note (Signed)
Please see EndoPro note dated 02/11/11. 

## 2011-02-12 ENCOUNTER — Encounter (HOSPITAL_COMMUNITY): Payer: Self-pay | Admitting: Gastroenterology

## 2011-02-12 NOTE — Anesthesia Postprocedure Evaluation (Signed)
Anesthesia Post Note  Patient: [name]  Procedure(s) Performed:  UPPER ENDOSCOPIC ULTRASOUND (EUS) LINEAR - mac  Anesthesia type: MAC  Patient location: PACU  Post pain: Pain level controlled  Post assessment: Post-op Vital signs reviewed  Last Vitals:  Filed Vitals:   02/11/11 1310  BP: 120/78  Pulse:   Temp:   Resp: 18    Post vital signs: Reviewed  Level of consciousness: sedated  Complications: No apparent anesthesia complications

## 2011-04-29 ENCOUNTER — Other Ambulatory Visit: Payer: BC Managed Care – PPO

## 2011-06-08 ENCOUNTER — Telehealth: Payer: Self-pay | Admitting: Cardiovascular Disease

## 2011-06-08 NOTE — Telephone Encounter (Signed)
Left msg to call back, she can have an app with PA if needs. Dr Elease Hashimoto not in office till end of week.

## 2011-06-08 NOTE — Telephone Encounter (Signed)
New msg Pt wants to talk to you about getting in to see Dr Elease Hashimoto soon. Please call her back

## 2011-06-08 NOTE — Telephone Encounter (Signed)
Follow-up: ° ° ° °Patient called returning your call. Please call back. °

## 2011-06-09 NOTE — Telephone Encounter (Signed)
App made 

## 2011-07-06 ENCOUNTER — Telehealth: Payer: Self-pay | Admitting: Cardiovascular Disease

## 2011-07-06 NOTE — Telephone Encounter (Signed)
New Problem:    Patient would like to speak with you about rescheduling her appointment on 07/15/11.  Please call back.

## 2011-07-14 ENCOUNTER — Ambulatory Visit (INDEPENDENT_AMBULATORY_CARE_PROVIDER_SITE_OTHER): Payer: BC Managed Care – PPO | Admitting: *Deleted

## 2011-07-14 ENCOUNTER — Ambulatory Visit: Payer: BC Managed Care – PPO | Admitting: Cardiovascular Disease

## 2011-07-14 DIAGNOSIS — R635 Abnormal weight gain: Secondary | ICD-10-CM

## 2011-07-14 DIAGNOSIS — E785 Hyperlipidemia, unspecified: Secondary | ICD-10-CM

## 2011-07-14 LAB — LIPID PANEL
Cholesterol: 191 mg/dL (ref 0–200)
HDL: 68.2 mg/dL (ref >39.00)
LDL Cholesterol: 108 mg/dL — ABNORMAL HIGH (ref 0–99)
Total CHOL/HDL Ratio: 3
Triglycerides: 75 mg/dL (ref 0.0–149.0)

## 2011-07-14 LAB — BASIC METABOLIC PANEL
CO2: 30 mEq/L (ref 19–32)
Chloride: 101 mEq/L (ref 96–112)
Creatinine, Ser: 0.8 mg/dL (ref 0.4–1.2)
Potassium: 4.6 mEq/L (ref 3.5–5.1)
Sodium: 139 mEq/L (ref 135–145)

## 2011-07-14 LAB — TSH: TSH: 2.08 u[IU]/mL (ref 0.35–5.50)

## 2011-07-14 LAB — HEPATIC FUNCTION PANEL
Albumin: 4.4 g/dL (ref 3.5–5.2)
Alkaline Phosphatase: 110 U/L (ref 39–117)
Total Protein: 7.3 g/dL (ref 6.0–8.3)

## 2011-07-14 NOTE — Telephone Encounter (Signed)
Pt had thyroid test today for wt gain and family hx of thyroidectomy, order placed for lab draw.

## 2011-07-14 NOTE — Telephone Encounter (Signed)
F/u from previous call.  Patient returning call back to nurse

## 2011-07-15 ENCOUNTER — Ambulatory Visit: Payer: BC Managed Care – PPO | Admitting: Cardiovascular Disease

## 2011-07-29 ENCOUNTER — Ambulatory Visit (INDEPENDENT_AMBULATORY_CARE_PROVIDER_SITE_OTHER): Payer: BC Managed Care – PPO | Admitting: Cardiovascular Disease

## 2011-07-29 ENCOUNTER — Encounter: Payer: Self-pay | Admitting: Cardiovascular Disease

## 2011-07-29 VITALS — BP 105/70 | HR 71 | Ht 66.0 in | Wt 135.4 lb

## 2011-07-29 DIAGNOSIS — E785 Hyperlipidemia, unspecified: Secondary | ICD-10-CM

## 2011-07-29 NOTE — Progress Notes (Signed)
    [name] Date of Birth  [birth year] [facility]     [place] Office  [address]    [address]   [address] [place], [place]  [address]    [place], [place]  [address] [phone]  Fax  [phone]  [phone]  Fax [phone]   History of Present Illness:  Problem List: 1. Hypercholesterolemia 2. Family Hx of CAD  He's been quite active. She's back playing tennis and is doing well. She denies any chest pain or shortness breath.  She works out on a regular basis and is able to achieve a peak heart rate 185 160 without too much difficulty. She works out for 30 minutes at a time doing these exercises.  She's also walking in the evenings with her husband.    Current Outpatient Prescriptions on File Prior to Visit  Medication Sig Dispense Refill  . atorvastatin (LIPITOR) 40 MG tablet Take 1 tablet (40 mg total) by mouth daily.  30 tablet  11  . Calcium-Vitamin D-Vitamin K (CALCIUM + D + K PO) Take by mouth 2 (two) times daily.        No Known Allergies  Past Medical History  Diagnosis Date  . Hyperlipidemia   . Diarrhea   . Abdominal distention   . Burping     excessive burping   . 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 GERD (gastroesophageal reflux disease)     Past Surgical History  Procedure Date  . Cholecystectomy 2012    laparoscopic  . Bladder surgery   . US echocardiography 11/03/2006    EF 55-60%  . Eus 02/11/2011    Procedure: UPPER ENDOSCOPIC ULTRASOUND (EUS) LINEAR;  Surgeon: Freddy Jaksch, MD;  Location: [facility] END[facility];  Service: Endoscopy;  Laterality: N/A;  mac    History  Smoking status  . Never Smoker   Smokeless tobacco  . Never Used    History  Alcohol Use No    Family History  Problem Relation Age of Onset  . Colon cancer Neg Hx   . Cancer Paternal Grandmother     Gallbladder  . Heart disease Mother   . Hypertension Mother   . Hypertension Father     Reviw of Systems:  Reviewed  in the HPI.  All other systems are negative.  Physical Exam: BP 105/70  Pulse 71  Ht 5\' 6"  (1.676 m)  Wt 135 lb 6.4 oz (61.417 kg)  BMI 21.85 kg/m2 The patient is alert and oriented x 3.  The mood and affect are normal.   Skin: warm and dry.  Color is normal.    HEENT:   Normocephalic/atraumatic. Mucous membranes are moist. Neck is supple  Lungs: Lungs are clear to auscultation.   Heart: Regular rate S1-S2. She has no murmurs.    Abdomen: Is good bowel sounds. Her laparoscopy scars well healed.  Extremities:  No clubbing cyanosis or edema. She has no palpable cords.  Neuro:  There exam is nonfocal. Her gait is normal.    ECG:  Assessment / Plan:

## 2011-07-29 NOTE — Assessment & Plan Note (Signed)
[name] is doing well.  Her lipids are well controlled.  Her LFTs are back to normal after her bout of cholecystitis last fall.  We well see her in 6 months.

## 2011-07-29 NOTE — Patient Instructions (Signed)
Your physician wants you to follow-up in: 6 months  You will receive a reminder letter in the mail two months in advance. If you don't receive a letter, please call our office to schedule the follow-up appointment.  Your physician recommends that you return for a FASTING lipid profile: 6 months   

## 2011-08-21 NOTE — H&P (Signed)
I saw and evaluated the patient in 2012 and agreed with [name] note (hx and PE without additions) and assessment and plan.

## 2012-02-24 ENCOUNTER — Other Ambulatory Visit: Payer: Self-pay | Admitting: *Deleted

## 2012-02-24 MED ORDER — ATORVASTATIN CALCIUM 40 MG PO TABS: 40.0000 mg | ORAL_TABLET | Freq: Every day | ORAL | Status: DC

## 2012-02-24 NOTE — Telephone Encounter (Signed)
Fax Received. Refill Completed. Sharai Overbay Chowoe (R.M.A)   

## 2012-04-08 ENCOUNTER — Encounter: Payer: Self-pay | Admitting: Cardiovascular Disease

## 2012-10-16 ENCOUNTER — Other Ambulatory Visit: Payer: Self-pay | Admitting: Cardiovascular Disease

## 2012-10-16 NOTE — Progress Notes (Signed)
Lashala reported a severe allergic reaction to the influenza vaccine.  She should not receive the vaccine again.  Vesta Mixer, Montez Hageman., MD, [facility] 10/16/2012, 9:26 AM Office - [phone] Pager [phone]

## 2012-12-02 IMAGING — US US ABDOMEN COMPLETE
1 series · 14 of 25 positions shown · non-contrast
Comparison: None

CLINICAL DATA: Elevated liver function studies and epigastric
pain.

COMPLETE ABDOMINAL ULTRASOUND

[Series 1: us abdomen complete · 0.32mm/px · 14 of 79 slices shown]
[im 1/79]
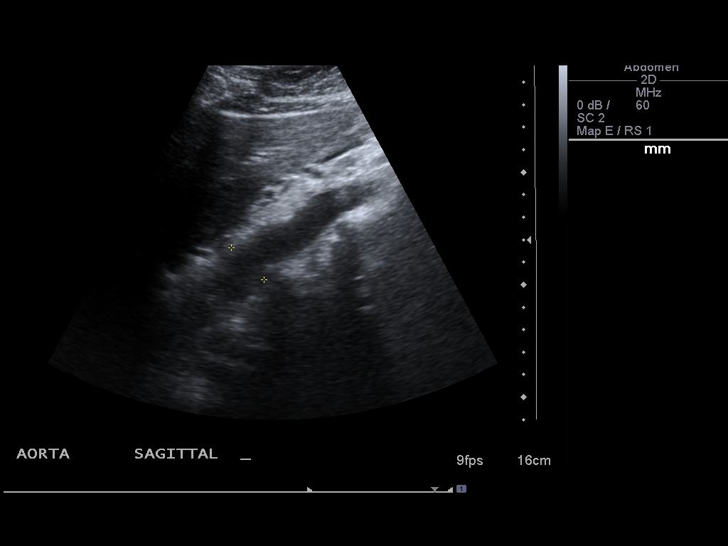
[im 7/79]
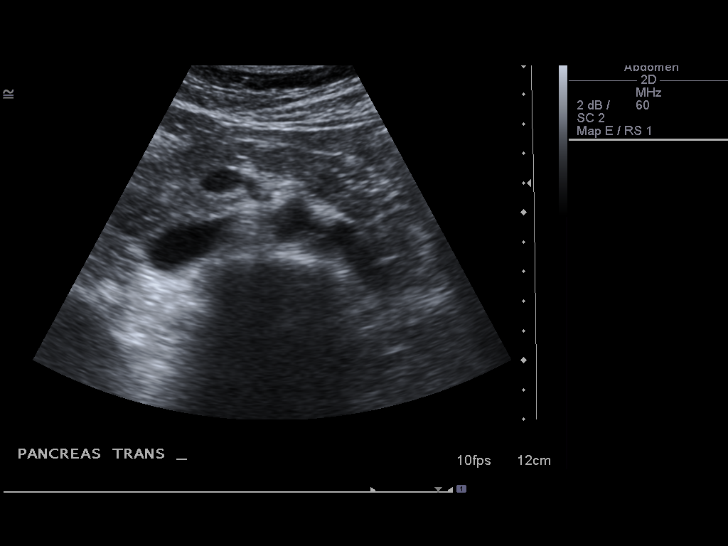
[im 14/79]
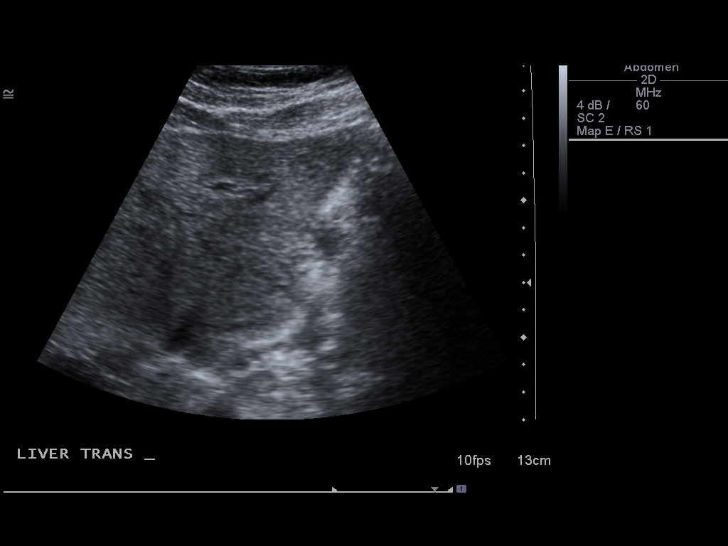
[im 20/79]
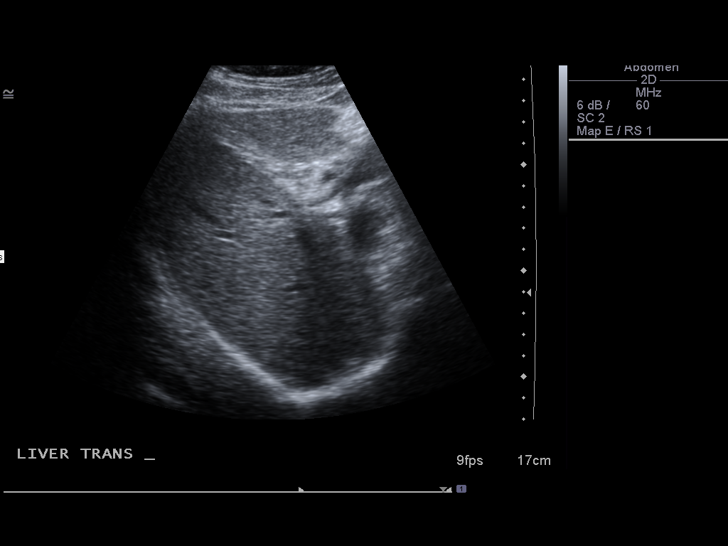
[im 27/79]
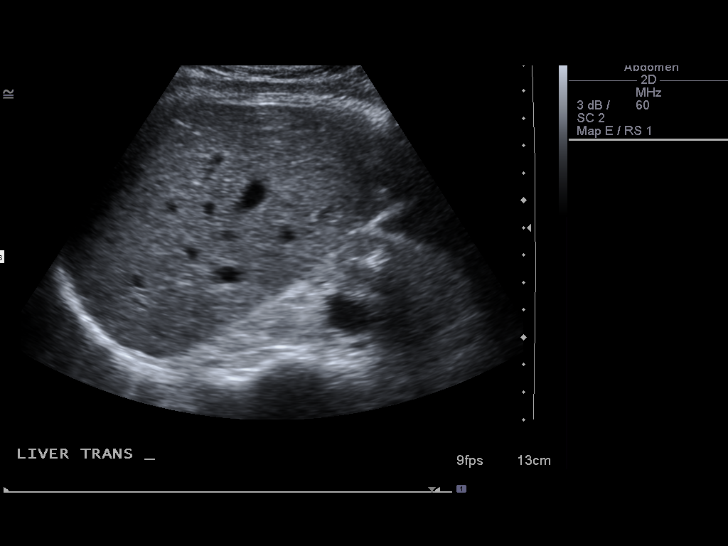
[im 30/79]
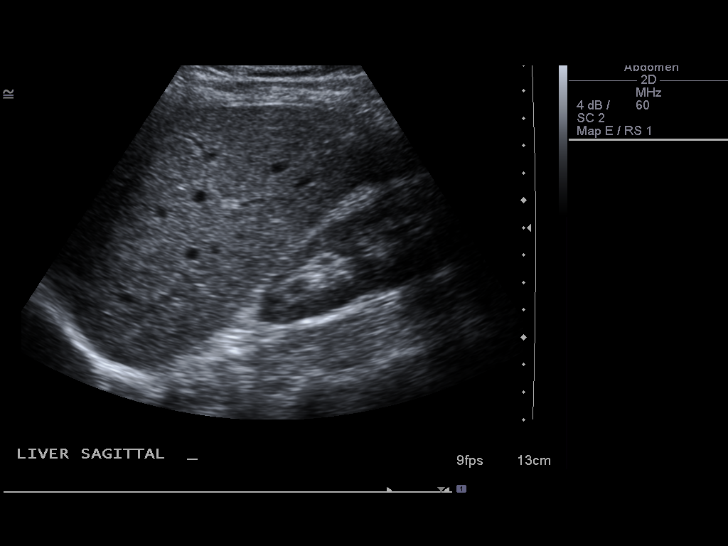
[im 36/79]
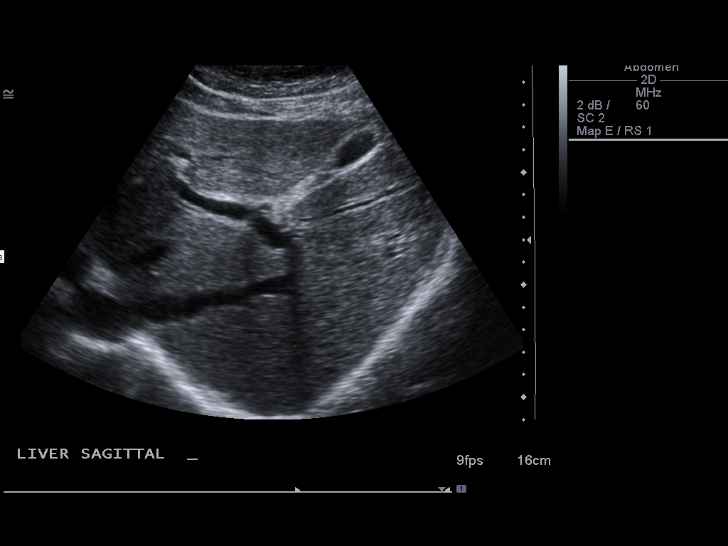
[im 43/79]
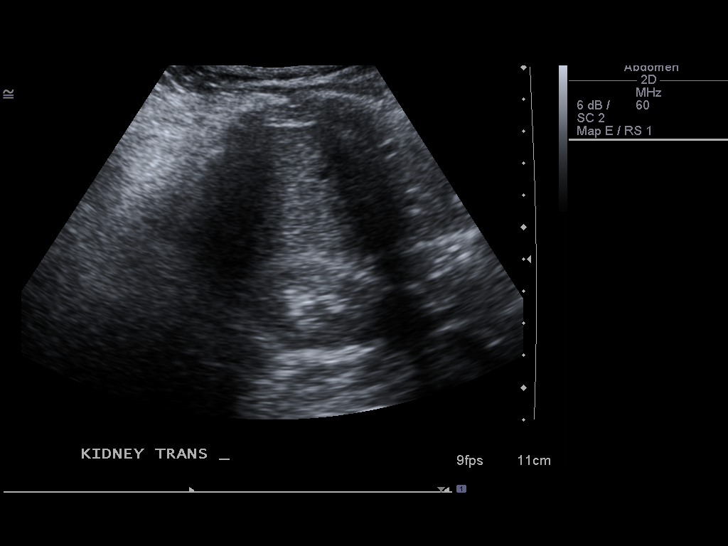
[im 49/79]
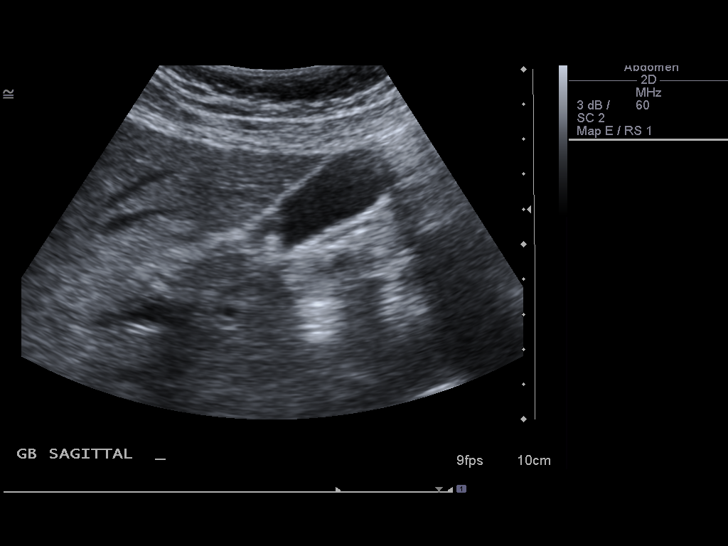
[im 53/79]
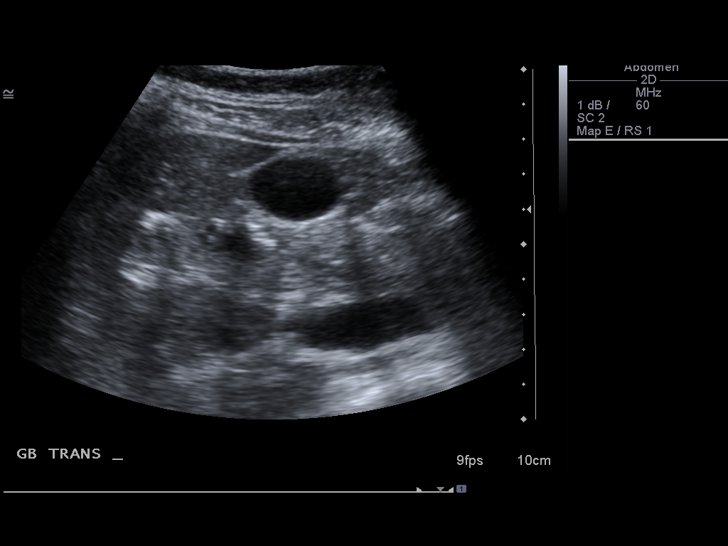
[im 59/79]
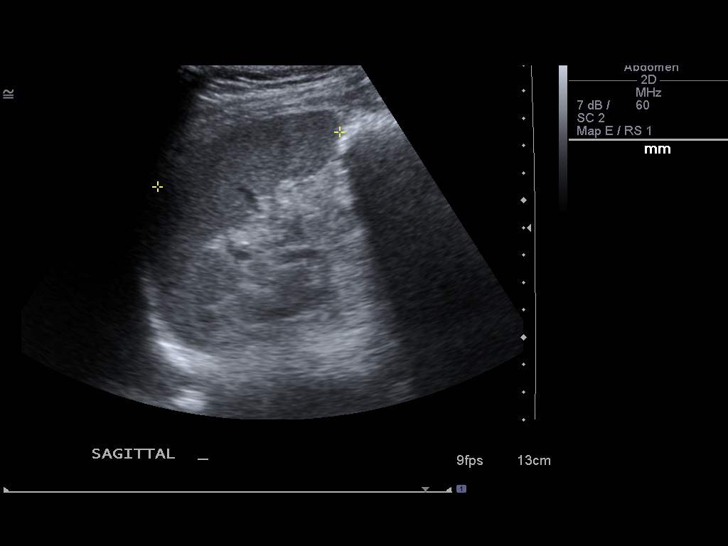
[im 66/79]
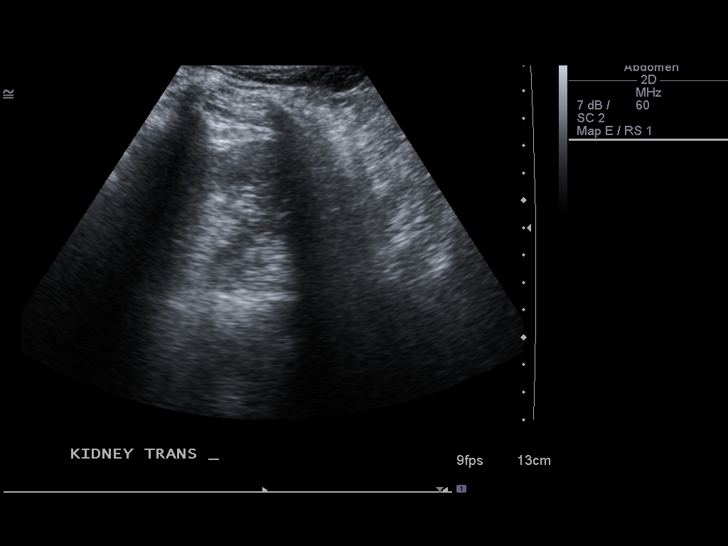
[im 72/79]
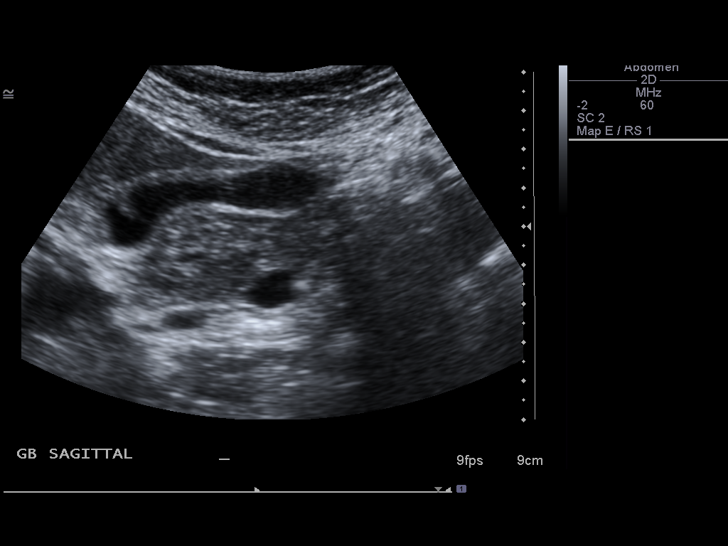
[im 79/79]
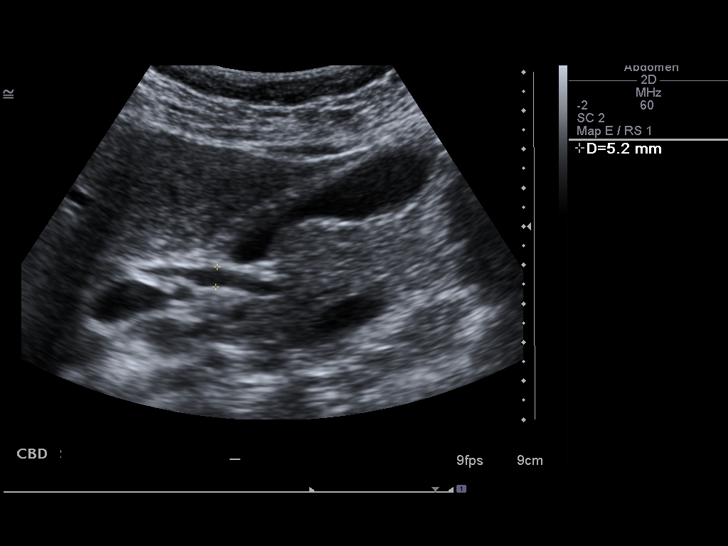

[14 of 25 positions shown; findings below may reference images not displayed]

FINDINGS: Gallbladder:  No gallstones, gallbladder wall thickening, or
pericholecystic fluid.

Common bile duct:  Normal in caliber measuring a maximum of 5.2mm.

Liver:  The liver is sonographically unremarkable.  There is normal
echogenicity without focal lesions or intrahepatic biliary
dilatation.

IVC:  Normal caliber.

Pancreas:  Sonographically unremarkable.

Spleen:  Normal size and echogenicity without focal lesions.

Right Kidney:  9.7 cm in length. Normal renal cortical thickness
and echogenicity without focal lesions or hydronephrosis.

Left Kidney:  9.8 cm in length. Normal renal cortical thickness and
echogenicity without focal lesions or hydronephrosis.

Abdominal aorta:  Normal caliber.
IMPRESSION: Unremarkable abdominal ultrasound examination.

## 2012-12-06 IMAGING — US US ABDOMEN COMPLETE
1 series · 14 of 22 positions shown · non-contrast
Comparison: CT abdomen and pelvis and complete abdominal
ultrasound 10/23/2010.

CLINICAL DATA: Abdominal pain. Elevated liver function tests.

LIMITED ABDOMINAL ULTRASOUND - RIGHT UPPER QUADRANT 10/27/2010:

[Series 1: us abdomen complete · 0.18mm/px · 14 of 22 slices shown]
[im 1/22]
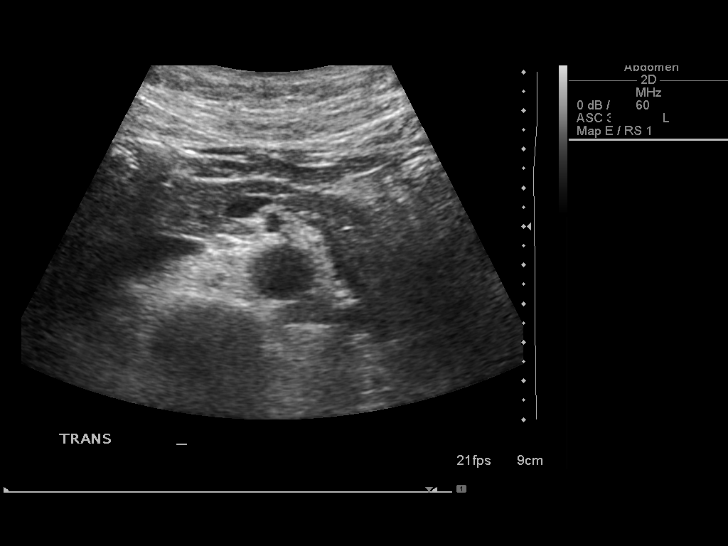
[im 3/22]
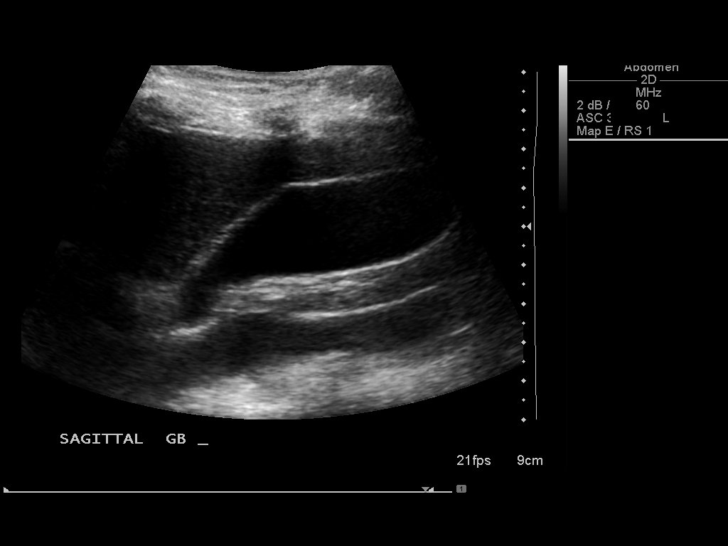
[im 4/22]
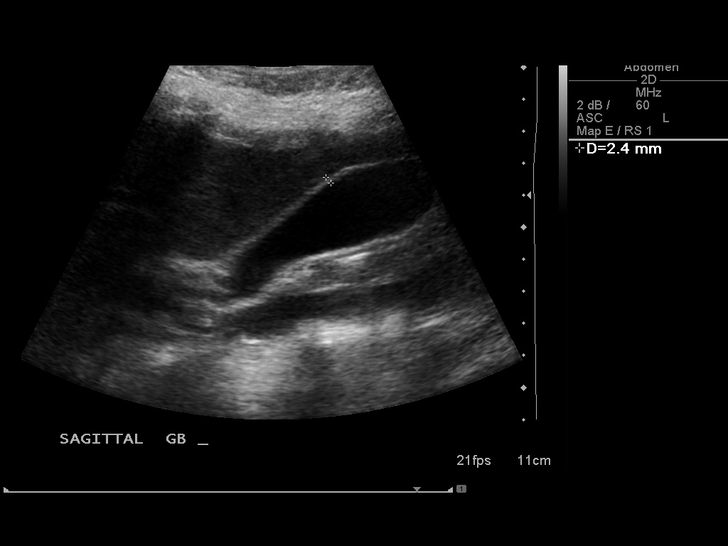
[im 6/22]
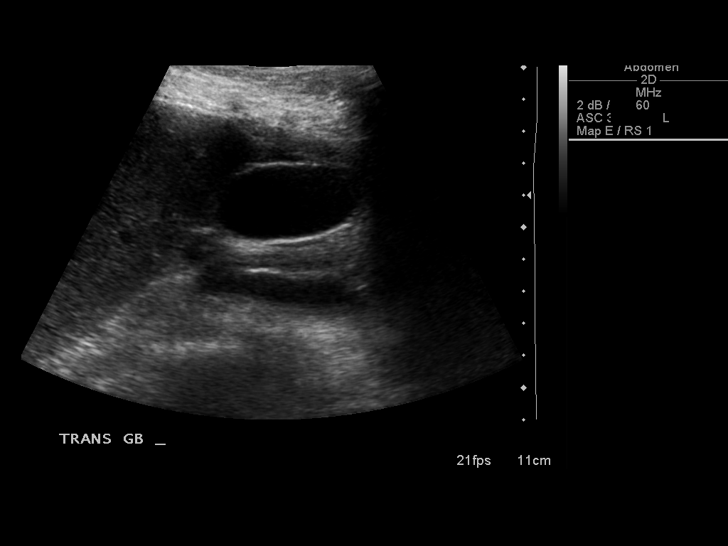
[im 8/22]
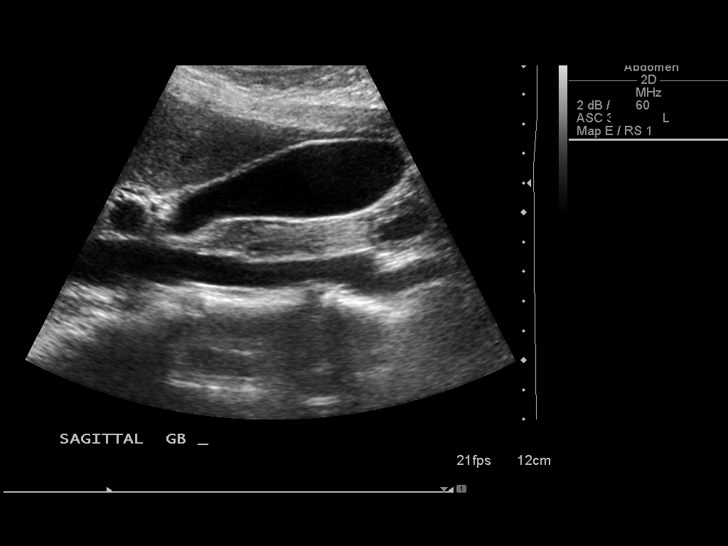
[im 9/22]
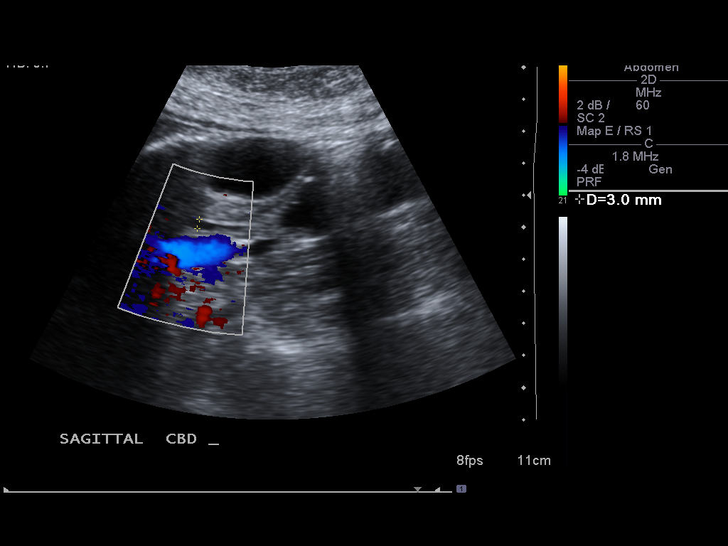
[im 11/22]
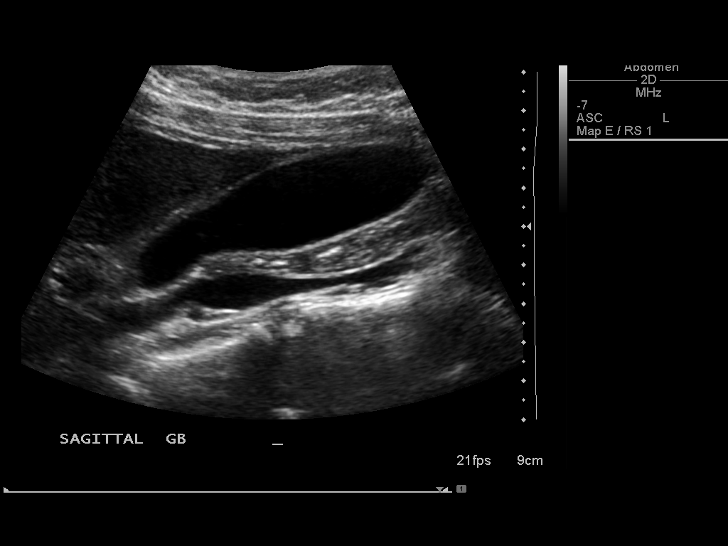
[im 12/22]
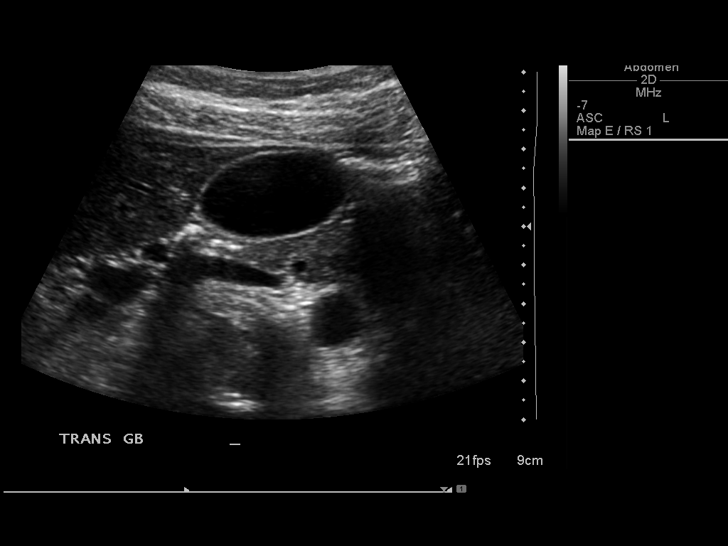
[im 14/22]
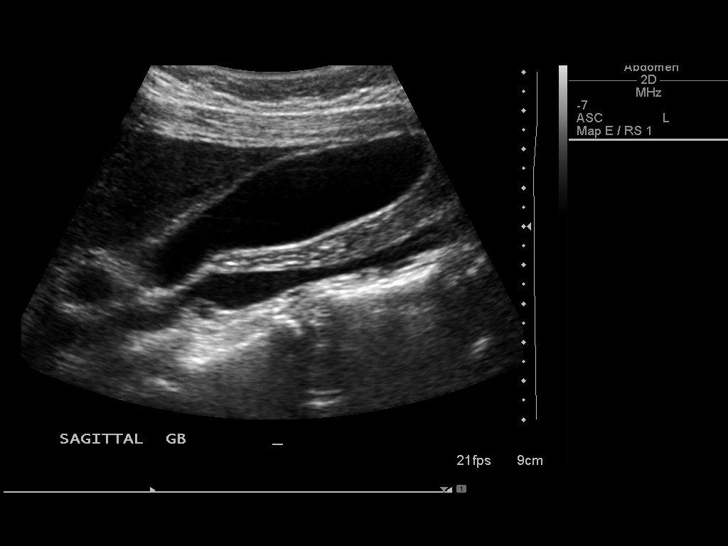
[im 15/22]
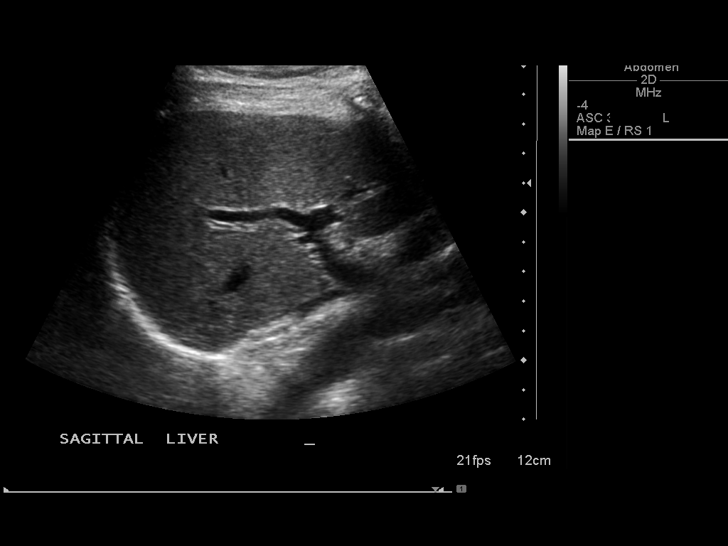
[im 17/22]
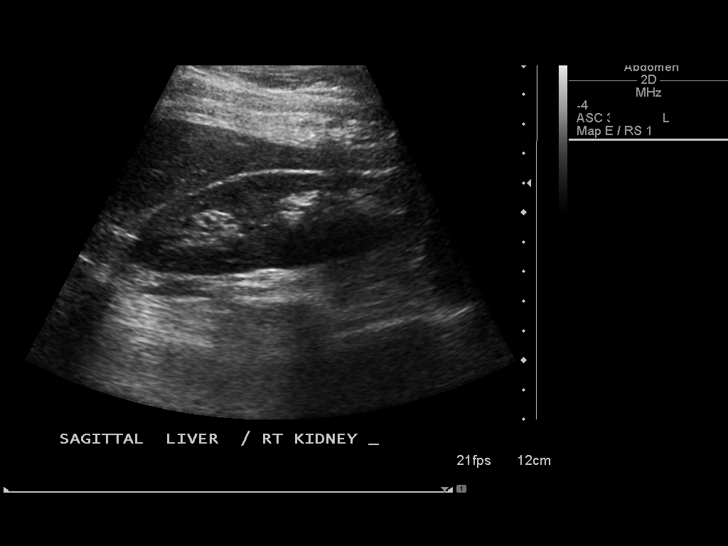
[im 19/22]
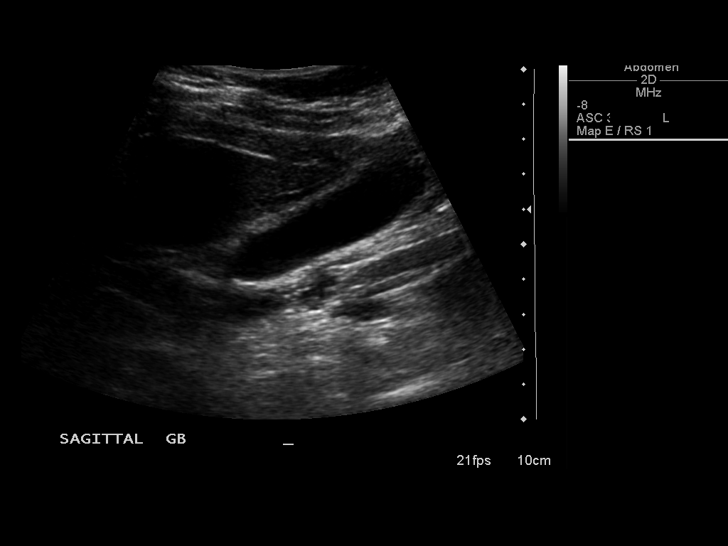
[im 20/22]
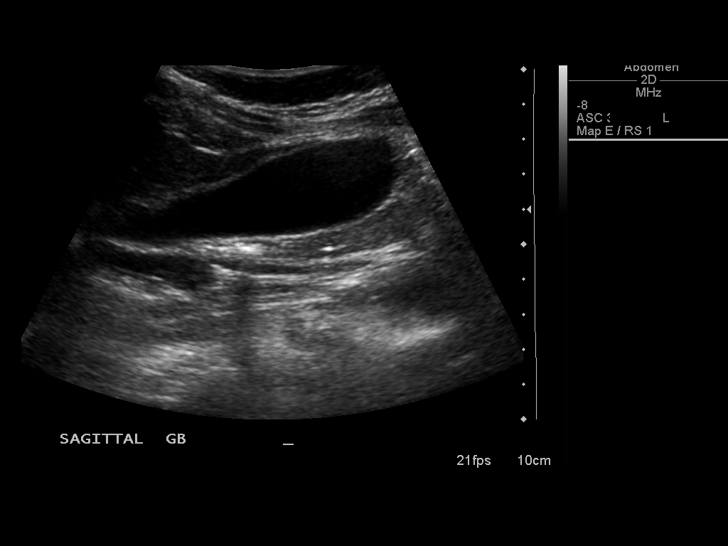
[im 22/22]
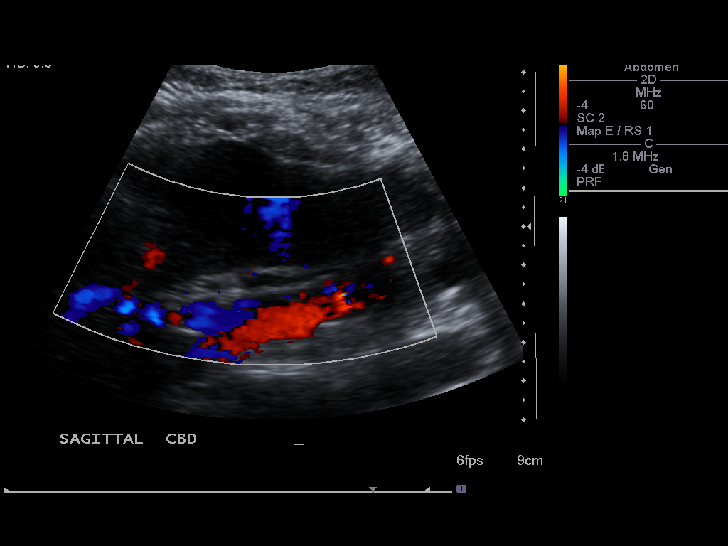

[14 of 22 positions shown; findings below may reference images not displayed]

FINDINGS: Gallbladder:  No shadowing gallstones or echogenic sludge.  No
gallbladder wall thickening or pericholecystic fluid.  Negative
sonographic Murphy's sign according to the ultrasound technologist.

Common bile duct:  Normal in caliber with maximum diameter
approximating 3 mm.

Liver:  Normal size and echotexture without focal parenchymal
abnormality.  Patent portal vein with hepatopetal flow.
IMPRESSION: Normal right upper quadrant abdominal ultrasound.

## 2012-12-06 IMAGING — NM NM HEPATOBILIARY IMAGE, INC GB
2 series · 12 of 12 positions shown · non-contrast
Comparison: Right upper quadrant abdominal ultrasound earlier same
date.  CT abdomen and pelvis 10/23/2010.

CLINICAL DATA: Right quadrant abdominal pain.  Nausea.  Recent
negative ultrasound.

NUCLEAR MEDICINE HEPATOBILIARY IMAGING WITH GALLBLADDER EF
10/27/2010:
TECHNIQUE: Sequential images of the abdomen were obtained [DATE] minutes following intravenous administration of
radiopharmaceutical.  After slow intravenous infusion of
micrograms Cholecystokinin, gallbladder ejection fraction was
determined.
Radiopharmaceutical:  4.7 mCi 4c-MMm Choletec

[Series 1: he hepato · 4.71mm/px · 6 of 30 frames shown (1 of 2)]
[frame 3/30]
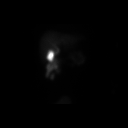
[frame 8/30]
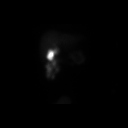
[frame 13/30]
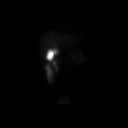
[frame 18/30]
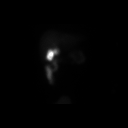
[frame 23/30]
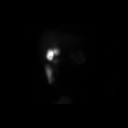
[frame 28/30]
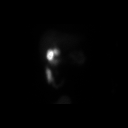

[Series 1: he hepato · 4.71mm/px · 6 of 60 frames shown (2 of 2)]
[frame 6/60]
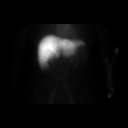
[frame 16/60]
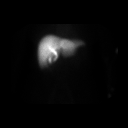
[frame 26/60]
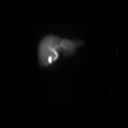
[frame 36/60]
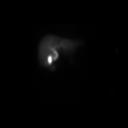
[frame 46/60]
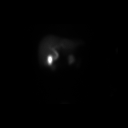
[frame 56/60]
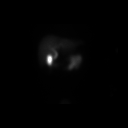

[12 of 12 positions shown; findings below may reference images not displayed]

FINDINGS: Hepatic uptake of the radiopharmaceutical is normal.
Intra and extrahepatic bile ducts are visible within 10 minutes.
Duodenal activity is identified within12 minutes.  Gallbladder
activity is visualized within 15 minutes, with normal filling of
the gallbladder throughout the remainder of the imaging sequence.
Mild tracer retention by the liver at 60 minutes.

Gallbladder ejection fraction measured 8.0% at 30 minutes (normal
is greater than 30%).

The patient did not experience symptoms during CCK infusion.
IMPRESSION: 1.  Severe biliary dyskinesis, with severely diminished gallbladder
ejection fraction.
2.  Mild tracer retention by the liver is consistent with primary
hepatocellular disease.
3.  No evidence of common bile duct or cystic duct obstruction.

## 2012-12-09 ENCOUNTER — Other Ambulatory Visit: Payer: Self-pay | Admitting: *Deleted

## 2012-12-09 ENCOUNTER — Telehealth: Payer: Self-pay | Admitting: Cardiovascular Disease

## 2012-12-09 MED ORDER — ATORVASTATIN CALCIUM 40 MG PO TABS: 40.0000 mg | ORAL_TABLET | Freq: Every day | ORAL | Status: DC

## 2012-12-16 ENCOUNTER — Ambulatory Visit: Payer: BC Managed Care – PPO | Admitting: Nurse Practitioner

## 2013-01-18 ENCOUNTER — Ambulatory Visit: Payer: BC Managed Care – PPO | Admitting: Cardiovascular Disease

## 2013-02-10 ENCOUNTER — Encounter: Payer: Self-pay | Admitting: Cardiovascular Disease

## 2013-02-14 ENCOUNTER — Telehealth: Payer: Self-pay | Admitting: Family Medicine

## 2013-02-14 NOTE — Telephone Encounter (Signed)
The patient's husband left a message on my machine that he needed to cancel an apppointment at 3pm today to speak with a doctor.  I looked in the patient's chart and it looks like she has not been seen in this office.  I called and spoke to the pt.  She said she was suppose to talk with Dr. Raliegh Ip today (on the phone) at 3 pm but she is unable because she is sick.  Informed her that I will pass this along to Dr. Marthann Schiller nurse.

## 2013-02-14 NOTE — Telephone Encounter (Signed)
Noted  

## 2013-02-21 ENCOUNTER — Ambulatory Visit: Payer: BC Managed Care – PPO | Admitting: Cardiovascular Disease

## 2013-03-15 ENCOUNTER — Telehealth: Payer: Self-pay | Admitting: Cardiovascular Disease

## 2013-03-15 DIAGNOSIS — E785 Hyperlipidemia, unspecified: Secondary | ICD-10-CM

## 2013-03-15 NOTE — Telephone Encounter (Signed)
New message          Pt is seeing dr Acie Fredrickson on 3/19 @ 3pm. Pt would like to have blood work before the appt and would like to have orders in so dr Acie Fredrickson can have the results. Pt would like to come either 3/12 or 3/13.

## 2013-03-15 NOTE — Telephone Encounter (Signed)
Lab date given/ orders placed.

## 2013-03-16 ENCOUNTER — Other Ambulatory Visit (INDEPENDENT_AMBULATORY_CARE_PROVIDER_SITE_OTHER): Payer: BC Managed Care – PPO

## 2013-03-16 DIAGNOSIS — E785 Hyperlipidemia, unspecified: Secondary | ICD-10-CM

## 2013-03-16 LAB — HEPATIC FUNCTION PANEL
ALT: 11 U/L (ref 0–35)
AST: 16 U/L (ref 0–37)
Albumin: 4.3 g/dL (ref 3.5–5.2)
Alkaline Phosphatase: 74 U/L (ref 39–117)
BILIRUBIN TOTAL: 0.8 mg/dL (ref 0.3–1.2)
Bilirubin, Direct: 0 mg/dL (ref 0.0–0.3)
Total Protein: 6.9 g/dL (ref 6.0–8.3)

## 2013-03-16 LAB — LIPID PANEL
CHOL/HDL RATIO: 4
Cholesterol: 262 mg/dL — ABNORMAL HIGH (ref 0–200)
HDL: 70.3 mg/dL (ref >39.00)
LDL CALC: 181 mg/dL — AB (ref 0–99)
Triglycerides: 55 mg/dL (ref 0.0–149.0)
VLDL: 11 mg/dL (ref 0.0–40.0)

## 2013-03-16 LAB — BASIC METABOLIC PANEL
BUN: 18 mg/dL (ref 6–23)
CALCIUM: 9.6 mg/dL (ref 8.4–10.5)
CO2: 30 meq/L (ref 19–32)
Chloride: 102 mEq/L (ref 96–112)
Creatinine, Ser: 0.9 mg/dL (ref 0.4–1.2)
GFR: 71.63 mL/min (ref >60.00)
GLUCOSE: 91 mg/dL (ref 70–99)
Potassium: 4.1 mEq/L (ref 3.5–5.1)
SODIUM: 138 meq/L (ref 135–145)

## 2013-03-21 ENCOUNTER — Other Ambulatory Visit: Payer: Self-pay | Admitting: Gastroenterology

## 2013-03-23 ENCOUNTER — Encounter: Payer: Self-pay | Admitting: Cardiovascular Disease

## 2013-03-23 ENCOUNTER — Ambulatory Visit (INDEPENDENT_AMBULATORY_CARE_PROVIDER_SITE_OTHER): Payer: BC Managed Care – PPO | Admitting: Cardiovascular Disease

## 2013-03-23 VITALS — BP 100/70 | HR 82 | Ht 66.0 in | Wt 141.4 lb

## 2013-03-23 DIAGNOSIS — E785 Hyperlipidemia, unspecified: Secondary | ICD-10-CM

## 2013-03-23 MED ORDER — ATORVASTATIN CALCIUM 40 MG PO TABS: 40.0000 mg | ORAL_TABLET | Freq: Every day | ORAL | Status: DC

## 2013-03-23 NOTE — Patient Instructions (Signed)
Your physician recommends that you return for a FASTING lipid profile: 3 months   Your physician wants you to follow-up in: 1 year You will receive a reminder letter in the mail two months in advance. If you don't receive a letter, please call our office to schedule the follow-up appointment.  Your physician recommends that you continue on your current medications as directed. Please refer to the Current Medication list given to you today.

## 2013-03-23 NOTE — Progress Notes (Signed)
[name] Date of Birth  05-Mar-1955 [place] [address]    [place]   [place], [place]  [address]    [place],   [address] [phone]  Fax  [phone]  [phone]  Fax [phone]   History of Present Illness:  Problem List: 1. Hypercholesterolemia 2. Family Hx of CAD  He's been quite active. She's back playing tennis and is doing well. She denies any chest pain or shortness breath.  She works out on a regular basis and is able to achieve a peak heart rate 185 160 without too much difficulty. She works out for 30 minutes at a time doing these exercises.  She's also walking in the evenings with her husband.   March 23, 2013:   [name] is doing OK.  She has stopped her Atrovastatin.  - she ran out and could not get back into see me.   Her LDL was 181 this last time.  Her previous LDL was 108.    She admits that her diet has not been as good and she has not been working out as much.      Current Outpatient Prescriptions on File Prior to Visit  Medication Sig Dispense Refill  . atorvastatin (LIPITOR) 40 MG tablet Take 1 tablet (40 mg total) by mouth daily.  30 tablet  0  . Calcium-Vitamin D-Vitamin K (CALCIUM + D + K PO) Take by mouth 2 (two) times daily.      . Cholecalciferol (VITAMIN D) 2000 UNITS tablet Take 2,000 Units by mouth daily.       No current facility-administered medications on file prior to visit.    Allergies  Allergen Reactions  . Influenza Vaccines     [name] reporte a severe allergic reaction to the influenza vaccine ( severe headache, nausea, vomitting)     Past Medical History  Diagnosis Date  . Hyperlipidemia   . Diarrhea   . Abdominal distention   . Burping     excessive burping   . 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 GERD (gastroesophageal reflux disease)     Past Surgical History  Procedure Laterality Date  . Cholecystectomy  2012   laparoscopic  . Bladder surgery    . US echocardiography  11/03/2006    EF 55-60%  . Eus  02/11/2011    Procedure: UPPER ENDOSCOPIC ULTRASOUND (EUS) LINEAR;  Surgeon: Landry Dyke, MD;  Location: [facility];  Service: Endoscopy;  Laterality: N/A;  mac    History  Smoking status  . Never Smoker   Smokeless tobacco  . Never Used    History  Alcohol Use No    Family History  Problem Relation Age of Onset  . Colon cancer Neg Hx   . Cancer Paternal Grandmother     Gallbladder  . Heart disease Mother   . Hypertension Mother   . Hypertension Father     Reviw of Systems:  Reviewed in the HPI.  All other systems are negative.  Physical Exam: BP 100/70  Pulse 82  Ht 5\' 6"  (1.676 m)  Wt 141 lb 6.4 oz (64.139 kg)  BMI 22.83 kg/m2 The patient is alert and oriented x 3.  The mood and affect are normal.   Skin: warm and dry.  Color is normal.    HEENT:   Normocephalic/atraumatic. Mucous membranes are moist. Neck is supple  Lungs: Lungs are  clear to auscultation.   Heart: Regular rate S1-S2. She has no murmurs.    Abdomen: Is good bowel sounds. Her laparoscopy scars well healed.  Extremities:  No clubbing cyanosis or edema. She has no palpable cords.  Neuro:  There exam is nonfocal. Her gait is normal.    ECG: March 23, 2013:  NSR at 3.    NS ST abn.  Assessment / Plan:

## 2013-03-23 NOTE — Assessment & Plan Note (Signed)
[name]  is doing very well. She has had a rough year.   She's been spending a lot of time taking care of her mother. As a result, she has not been exercising as much. She also is not been sticking to her diet.   She ran out of her atorvastatin.   Her LDL cholesterol has gone up to 181. He in fact that her mother had premature coronary artery disease, I think that we should be aggressive in her lipid management. We will restart the atorvastatin 40 mg a day. We'll recheck fasting labs in 3 months. I'll see her again in one year.

## 2013-06-27 ENCOUNTER — Other Ambulatory Visit: Payer: BC Managed Care – PPO

## 2013-06-29 ENCOUNTER — Other Ambulatory Visit (INDEPENDENT_AMBULATORY_CARE_PROVIDER_SITE_OTHER): Payer: BC Managed Care – PPO

## 2013-06-29 DIAGNOSIS — E785 Hyperlipidemia, unspecified: Secondary | ICD-10-CM

## 2013-06-29 LAB — LIPID PANEL
CHOL/HDL RATIO: 3
Cholesterol: 167 mg/dL (ref 0–200)
HDL: 59.8 mg/dL (ref >39.00)
LDL CALC: 94 mg/dL (ref 0–99)
NONHDL: 107.2
Triglycerides: 65 mg/dL (ref 0.0–149.0)
VLDL: 13 mg/dL (ref 0.0–40.0)

## 2013-06-29 LAB — HEPATIC FUNCTION PANEL
ALBUMIN: 4.3 g/dL (ref 3.5–5.2)
ALT: 15 U/L (ref 0–35)
AST: 20 U/L (ref 0–37)
Alkaline Phosphatase: 66 U/L (ref 39–117)
Bilirubin, Direct: 0 mg/dL (ref 0.0–0.3)
TOTAL PROTEIN: 6.7 g/dL (ref 6.0–8.3)
Total Bilirubin: 0.8 mg/dL (ref 0.2–1.2)

## 2013-06-29 LAB — BASIC METABOLIC PANEL
BUN: 23 mg/dL (ref 6–23)
CALCIUM: 9.2 mg/dL (ref 8.4–10.5)
CHLORIDE: 105 meq/L (ref 96–112)
CO2: 24 meq/L (ref 19–32)
Creatinine, Ser: 0.9 mg/dL (ref 0.4–1.2)
GFR: 72.52 mL/min (ref >60.00)
GLUCOSE: 96 mg/dL (ref 70–99)
POTASSIUM: 3.8 meq/L (ref 3.5–5.1)
SODIUM: 138 meq/L (ref 135–145)

## 2013-07-13 ENCOUNTER — Encounter: Payer: Self-pay | Admitting: Nurse Practitioner

## 2013-07-20 ENCOUNTER — Telehealth: Payer: Self-pay | Admitting: Nurse Practitioner

## 2013-07-20 NOTE — Telephone Encounter (Signed)
Reviewed lab results in detail with patient via telephone

## 2013-08-18 NOTE — Telephone Encounter (Signed)
error 

## 2014-03-09 ENCOUNTER — Telehealth: Payer: Self-pay | Admitting: Cardiovascular Disease

## 2014-03-09 DIAGNOSIS — E785 Hyperlipidemia, unspecified: Secondary | ICD-10-CM

## 2014-03-09 NOTE — Telephone Encounter (Signed)
New problem   Pt want to know if she need labs before her office visit on 3.18.16.please advise.

## 2014-03-09 NOTE — Telephone Encounter (Signed)
Spoke with patient who states she usually gets lab work done around the same she sees Dr. Acie Fredrickson.  Per Dr. Elmarie Shiley approval, I scheduled patient for a lab appointment for fasting cholesterol,  Liver, bmet 2 days before office visit.  Patient verbalized understanding and agreement.

## 2014-03-19 ENCOUNTER — Other Ambulatory Visit: Payer: Self-pay | Admitting: Obstetrics and Gynecology

## 2014-03-20 LAB — CYTOLOGY - PAP

## 2014-03-21 ENCOUNTER — Other Ambulatory Visit: Payer: Self-pay

## 2014-03-22 ENCOUNTER — Other Ambulatory Visit (INDEPENDENT_AMBULATORY_CARE_PROVIDER_SITE_OTHER): Payer: BLUE CROSS/BLUE SHIELD | Admitting: *Deleted

## 2014-03-22 DIAGNOSIS — E785 Hyperlipidemia, unspecified: Secondary | ICD-10-CM

## 2014-03-22 LAB — BASIC METABOLIC PANEL
BUN: 19 mg/dL (ref 6–23)
CO2: 30 mEq/L (ref 19–32)
CREATININE: 0.89 mg/dL (ref 0.40–1.20)
Calcium: 9.3 mg/dL (ref 8.4–10.5)
Chloride: 104 mEq/L (ref 96–112)
GFR: 69.53 mL/min (ref >60.00)
GLUCOSE: 93 mg/dL (ref 70–99)
Potassium: 3.8 mEq/L (ref 3.5–5.1)
Sodium: 139 mEq/L (ref 135–145)

## 2014-03-22 LAB — HEPATIC FUNCTION PANEL
ALT: 13 U/L (ref 0–35)
AST: 17 U/L (ref 0–37)
Albumin: 4.3 g/dL (ref 3.5–5.2)
Alkaline Phosphatase: 91 U/L (ref 39–117)
BILIRUBIN DIRECT: 0.2 mg/dL (ref 0.0–0.3)
Total Bilirubin: 0.7 mg/dL (ref 0.2–1.2)
Total Protein: 6.6 g/dL (ref 6.0–8.3)

## 2014-03-22 LAB — LIPID PANEL
CHOLESTEROL: 157 mg/dL (ref 0–200)
HDL: 59.3 mg/dL (ref >39.00)
LDL Cholesterol: 87 mg/dL (ref 0–99)
NonHDL: 97.7
TRIGLYCERIDES: 54 mg/dL (ref 0.0–149.0)
Total CHOL/HDL Ratio: 3
VLDL: 10.8 mg/dL (ref 0.0–40.0)

## 2014-03-23 ENCOUNTER — Encounter: Payer: Self-pay | Admitting: Cardiovascular Disease

## 2014-03-23 ENCOUNTER — Ambulatory Visit (INDEPENDENT_AMBULATORY_CARE_PROVIDER_SITE_OTHER): Payer: BLUE CROSS/BLUE SHIELD | Admitting: Cardiovascular Disease

## 2014-03-23 VITALS — BP 126/90 | HR 68 | Ht 66.0 in | Wt 133.4 lb

## 2014-03-23 DIAGNOSIS — E785 Hyperlipidemia, unspecified: Secondary | ICD-10-CM

## 2014-03-23 NOTE — Patient Instructions (Signed)
Your physician recommends that you continue on your current medications as directed. Please refer to the Current Medication list given to you today.  Your physician wants you to follow-up in: 1 year with Dr. Acie Fredrickson.  You will receive a reminder letter in the mail two months in advance. If you don't receive a letter, please call our office to schedule the follow-up appointment. Your physician recommends that you return for lab work in: 1 year on the day of or a few days before your office visit with Dr. Acie Fredrickson.  You will need to FAST for this appointment - nothing to eat or drink after midnight the night before except water.

## 2014-03-23 NOTE — Progress Notes (Signed)
Cardiology Office Note   Date:  03/23/2014   ID:  [name], DOB [birth year], MRN [id number]  PCP:  [name], MD  Cardiologist:   Thayer Headings, MD   Chief Complaint  Patient presents with  . Hyperlipidemia   Problem List: 1. Hypercholesterolemia 2. Family Hx of CAD  He's been quite active. She's back playing tennis and is doing well. She denies any chest pain or shortness breath. She works out on a regular basis and is able to achieve a peak heart rate 185 160 without too much difficulty. She works out for 30 minutes at a time doing these exercises. She's also walking in the evenings with her husband.   March 23, 2013:  [name] is doing OK. She has stopped her Atrovastatin. - she ran out and could not get back into see me.  Her LDL was 181 this last time. Her previous LDL was 108. She admits that her diet has not been as good and she has not been working out as much.   March 23, 2014:  [name] is a 59 y.o. female who presents for follow up of her hyperlipidemia.    She's done very well. She's exercising readily. She's lost a little bit of weight. She's not having any episodes of chest pain.  Past Medical History  Diagnosis Date  . Hyperlipidemia   . Diarrhea   . Abdominal distention   . Burping     excessive burping   . 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 GERD (gastroesophageal reflux disease)     Past Surgical History  Procedure Laterality Date  . Cholecystectomy  2012    laparoscopic  . Bladder surgery    . US echocardiography  11/03/2006    EF 55-60%  . Eus  02/11/2011    Procedure: UPPER ENDOSCOPIC ULTRASOUND (EUS) LINEAR;  Surgeon: Landry Dyke, MD;  Location: [facility];  Service: Endoscopy;  Laterality: N/A;  mac     Current Outpatient Prescriptions  Medication Sig Dispense Refill  . atorvastatin (LIPITOR) 40 MG tablet Take 1 tablet (40 mg total) by mouth daily. 90 tablet 3  . Cholecalciferol  (VITAMIN D) 2000 UNITS tablet Take 2,000 Units by mouth daily.     No current facility-administered medications for this visit.    Allergies:   Influenza vaccines    Social History:  The patient  reports that she has never smoked. She has never used smokeless tobacco. She reports that she does not drink alcohol or use illicit drugs.   Family History:  The patient's family history includes Cancer in her paternal grandmother; Heart disease in her mother; Hypertension in her father and mother. There is no history of Colon cancer.    ROS:  Please see the history of present illness.    Review of Systems: Constitutional:  denies fever, chills, diaphoresis, appetite change and fatigue.  HEENT: denies photophobia, eye pain, redness, hearing loss, ear pain, congestion, sore throat, rhinorrhea, sneezing, neck pain, neck stiffness and tinnitus.  Respiratory: denies SOB, DOE, cough, chest tightness, and wheezing.  Cardiovascular: denies chest pain, palpitations and leg swelling.  Gastrointestinal: denies nausea, vomiting, abdominal pain, diarrhea, constipation, blood in stool.  Genitourinary: denies dysuria, urgency, frequency, hematuria, flank pain and difficulty urinating.  Musculoskeletal: denies  myalgias, back pain, joint swelling, arthralgias and gait problem.   Skin: denies pallor, rash and wound.  Neurological: denies dizziness, seizures, syncope, weakness, light-headedness, numbness and headaches.  Hematological: denies adenopathy, easy bruising, personal or family bleeding history.  Psychiatric/ Behavioral: denies suicidal ideation, mood changes, confusion, nervousness, sleep disturbance and agitation.       All other systems are reviewed and negative.    PHYSICAL EXAM: VS:  BP 126/90 mmHg  Pulse 68  Ht 5\' 6"  (1.676 m)  Wt 133 lb 6.4 oz (60.51 kg)  BMI 21.54 kg/m2 , BMI Body mass index is 21.54 kg/(m^2). GEN: Well nourished, well developed, in no acute distress HEENT:  normal Neck: no JVD, carotid bruits, or masses Cardiac: RRR; no murmurs, rubs, or gallops,no edema  Respiratory:  clear to auscultation bilaterally, normal work of breathing GI: soft, nontender, nondistended, + BS MS: no deformity or atrophy Skin: warm and dry, no rash Neuro:  Strength and sensation are intact Psych: normal   EKG:  EKG is ordered today. The ekg ordered today demonstrates NSR at 68.  Normal ECG    Recent Labs: 03/22/2014: ALT 13; BUN 19; Creatinine 0.89; Potassium 3.8; Sodium 139    Lipid Panel    Component Value Date/Time   CHOL 157 03/22/2014 0828   TRIG 54.0 03/22/2014 0828   HDL 59.30 03/22/2014 0828   CHOLHDL 3 03/22/2014 0828   VLDL 10.8 03/22/2014 0828   LDLCALC 87 03/22/2014 0828   LDLDIRECT 147.6 01/27/2011 0902      Wt Readings from Last 3 Encounters:  03/23/14 133 lb 6.4 oz (60.51 kg)  03/23/13 141 lb 6.4 oz (64.139 kg)  07/29/11 135 lb 6.4 oz (61.417 kg)      Other studies Reviewed: Additional studies/ records that were reviewed today include: . Review of the above records demonstrates:    ASSESSMENT AND PLAN:  1.  Hyperlipidemia: [name] is doing well. Her labs are well controlled on the current dose of atorvastatin. Continue atorvastatin. She'll continue with a good diet and exercise program. I'll see her again in one year.   Current medicines are reviewed at length with the patient today.  The patient does not have concerns regarding medicines.  The following changes have been made:  no change  Labs/ tests ordered today include:  No orders of the defined types were placed in this encounter.     Disposition:   FU with 1 year .     Signed, Braniya Farrugia, Wonda Cheng, MD  03/23/2014 10:04 AM    [address], [place], [place]  [address] Phone: [phone]; Fax: [phone]

## 2014-08-01 ENCOUNTER — Other Ambulatory Visit: Payer: Self-pay | Admitting: *Deleted

## 2014-08-01 DIAGNOSIS — E785 Hyperlipidemia, unspecified: Secondary | ICD-10-CM

## 2014-08-01 MED ORDER — ATORVASTATIN CALCIUM 40 MG PO TABS: 40.0000 mg | ORAL_TABLET | Freq: Every day | ORAL | Status: DC

## 2014-08-14 ENCOUNTER — Encounter: Payer: Self-pay | Admitting: Gastroenterology

## 2015-04-04 ENCOUNTER — Other Ambulatory Visit (INDEPENDENT_AMBULATORY_CARE_PROVIDER_SITE_OTHER): Payer: BLUE CROSS/BLUE SHIELD | Admitting: *Deleted

## 2015-04-04 DIAGNOSIS — E785 Hyperlipidemia, unspecified: Secondary | ICD-10-CM

## 2015-04-04 LAB — BASIC METABOLIC PANEL
BUN: 17 mg/dL (ref 7–25)
CHLORIDE: 104 mmol/L (ref 98–110)
CO2: 27 mmol/L (ref 20–31)
CREATININE: 0.9 mg/dL (ref 0.50–1.05)
Calcium: 9.1 mg/dL (ref 8.6–10.4)
Glucose, Bld: 85 mg/dL (ref 65–99)
POTASSIUM: 4.4 mmol/L (ref 3.5–5.3)
Sodium: 138 mmol/L (ref 135–146)

## 2015-04-04 LAB — HEPATIC FUNCTION PANEL
ALK PHOS: 89 U/L (ref 33–130)
ALT: 10 U/L (ref 6–29)
AST: 14 U/L (ref 10–35)
Albumin: 4 g/dL (ref 3.6–5.1)
BILIRUBIN DIRECT: 0.1 mg/dL (ref <=0.2)
BILIRUBIN INDIRECT: 0.5 mg/dL (ref 0.2–1.2)
BILIRUBIN TOTAL: 0.6 mg/dL (ref 0.2–1.2)
TOTAL PROTEIN: 6.3 g/dL (ref 6.1–8.1)

## 2015-04-04 LAB — LIPID PANEL
Cholesterol: 185 mg/dL (ref 125–200)
HDL: 68 mg/dL (ref >=46)
LDL Cholesterol: 103 mg/dL (ref <130)
Total CHOL/HDL Ratio: 2.7 Ratio (ref <=5.0)
Triglycerides: 72 mg/dL (ref <150)
VLDL: 14 mg/dL (ref <30)

## 2015-04-04 NOTE — Addendum Note (Signed)
Addended by: Eulis Foster on: 04/04/2015 09:04 AM   Modules accepted: Orders

## 2015-04-05 ENCOUNTER — Encounter: Payer: Self-pay | Admitting: Cardiovascular Disease

## 2015-04-05 ENCOUNTER — Ambulatory Visit (INDEPENDENT_AMBULATORY_CARE_PROVIDER_SITE_OTHER): Payer: BLUE CROSS/BLUE SHIELD | Admitting: Cardiovascular Disease

## 2015-04-05 VITALS — BP 130/90 | HR 59 | Ht 66.0 in | Wt 139.4 lb

## 2015-04-05 DIAGNOSIS — E785 Hyperlipidemia, unspecified: Secondary | ICD-10-CM | POA: Diagnosis not present

## 2015-04-05 MED ORDER — ATORVASTATIN CALCIUM 40 MG PO TABS: 40.0000 mg | ORAL_TABLET | Freq: Every day | ORAL | Status: DC

## 2015-04-05 NOTE — Progress Notes (Signed)
Cardiology Office Note   Date:  04/05/2015   ID:  [name], DOB 08-Apr-1955, MRN [id number]  PCP:  [name], MD  Cardiologist:   Thayer Headings, MD   Chief Complaint  Patient presents with  . Hyperlipidemia    NO CP, NO SOB, AND NO SWELLING.  . Follow-up   Problem List: 1. Hypercholesterolemia 2. Family Hx of CAD  He's been quite active. She's back playing tennis and is doing well. She denies any chest pain or shortness breath. She works out on a regular basis and is able to achieve a peak heart rate 185 160 without too much difficulty. She works out for 30 minutes at a time doing these exercises. She's also walking in the evenings with her husband.   March 23, 2013:  [name] is doing OK. She has stopped her Atrovastatin. - she ran out and could not get back into see me.  Her LDL was 181 this last time. Her previous LDL was 108. She admits that her diet has not been as good and she has not been working out as much.   March 23, 2014:  [name] is a 60 y.o. female who presents for follow up of her hyperlipidemia.    She's done very well. She's exercising readily. She's lost a little bit of weight. She's not having any episodes of chest pain.  April 05, 2015:  [name] is seen today for follow up of her hyperlipdiemia. Staying active - golf and tennis regularly .  Walks regularly BP is up slighlty .    Past Medical History  Diagnosis Date  . Hyperlipidemia   . Diarrhea   . Abdominal distention   . Burping     excessive burping   . 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 GERD (gastroesophageal reflux disease)     Past Surgical History  Procedure Laterality Date  . Cholecystectomy  2012    laparoscopic  . Bladder surgery    . US echocardiography  11/03/2006    EF 55-60%  . Eus  02/11/2011    Procedure: UPPER ENDOSCOPIC ULTRASOUND (EUS) LINEAR;  Surgeon: Landry Dyke, MD;  Location: [facility];  Service:  Endoscopy;  Laterality: N/A;  mac     Current Outpatient Prescriptions  Medication Sig Dispense Refill  . atorvastatin (LIPITOR) 40 MG tablet Take 1 tablet (40 mg total) by mouth daily. 90 tablet 3  . Cholecalciferol (VITAMIN D) 2000 UNITS tablet Take 2,000 Units by mouth daily.     No current facility-administered medications for this visit.    Allergies:   Influenza vaccines    Social History:  The patient  reports that she has never smoked. She has never used smokeless tobacco. She reports that she does not drink alcohol or use illicit drugs.   Family History:  The patient's family history includes Cancer in her paternal grandmother; Heart disease in her mother; Hypertension in her father and mother. There is no history of Colon cancer.    ROS:  Please see the history of present illness.    Review of Systems: Constitutional:  denies fever, chills, diaphoresis, appetite change and fatigue.  HEENT: denies photophobia, eye pain, redness, hearing loss, ear pain, congestion, sore throat, rhinorrhea, sneezing, neck pain, neck stiffness and tinnitus.  Respiratory: denies SOB, DOE, cough, chest tightness, and wheezing.  Cardiovascular: denies chest pain, palpitations and leg swelling.  Gastrointestinal: denies nausea, vomiting, abdominal pain, diarrhea, constipation, blood in stool.  Genitourinary: denies dysuria, urgency, frequency, hematuria, flank pain and difficulty urinating.  Musculoskeletal: denies  myalgias, back pain, joint swelling, arthralgias and gait problem.   Skin: denies pallor, rash and wound.  Neurological: denies dizziness, seizures, syncope, weakness, light-headedness, numbness and headaches.   Hematological: denies adenopathy, easy bruising, personal or family bleeding history.  Psychiatric/ Behavioral: denies suicidal ideation, mood changes, confusion, nervousness, sleep disturbance and agitation.       All other systems are reviewed and negative.     PHYSICAL EXAM: VS:  BP 130/90 mmHg  Pulse 59  Ht 5\' 6"  (1.676 m)  Wt 139 lb 6.4 oz (63.231 kg)  BMI 22.51 kg/m2 , BMI Body mass index is 22.51 kg/(m^2). GEN: Well nourished, well developed, in no acute distress HEENT: normal Neck: no JVD, carotid bruits, or masses Cardiac: RRR; no murmurs, rubs, or gallops,no edema  Respiratory:  clear to auscultation bilaterally, normal work of breathing GI: soft, nontender, nondistended, + BS MS: no deformity or atrophy Skin: warm and dry, no rash Neuro:  Strength and sensation are intact Psych: normal   EKG:  EKG is ordered today. The ekg ordered today demonstrates sinus brady at 59.    Normal ECG    Recent Labs: 04/04/2015: ALT 10; BUN 17; Creat 0.90; Potassium 4.4; Sodium 138    Lipid Panel    Component Value Date/Time   CHOL 185 04/04/2015 0905   TRIG 72 04/04/2015 0905   HDL 68 04/04/2015 0905   CHOLHDL 2.7 04/04/2015 0905   VLDL 14 04/04/2015 0905   LDLCALC 103 04/04/2015 0905   LDLDIRECT 147.6 01/27/2011 0902      Wt Readings from Last 3 Encounters:  04/05/15 139 lb 6.4 oz (63.231 kg)  03/23/14 133 lb 6.4 oz (60.51 kg)  03/23/13 141 lb 6.4 oz (64.139 kg)      Other studies Reviewed: Additional studies/ records that were reviewed today include: . Review of the above records demonstrates:    ASSESSMENT AND PLAN:  1.  Hyperlipidemia: [name] is doing well. Her labs are ok on the current dose of atorvastatin. She admits that she does not take it daily .  Continue atorvastatin. She'll continue with a good diet and exercise program. I'll see her again in one year.   Current medicines are reviewed at length with the patient today.  The patient does not have concerns regarding medicines.  The following changes have been made:  no change  Labs/ tests ordered today include:  No orders of the defined types were placed in this encounter.     Disposition:   FU with me in 1 year .     Signed, Jaklyn Alen, Wonda Cheng, MD   04/05/2015 10:30 AM    [facility] [place], [place], [place]  [address] Phone: [phone]; Fax: [phone]

## 2015-04-05 NOTE — Patient Instructions (Signed)
Medication Instructions:  Your physician recommends that you continue on your current medications as directed. Please refer to the Current Medication list given to you today.   Labwork: Your physician recommends that you return for lab work in: 1 year on the day of or a few days before your office visit with Dr. Nahser.  You will need to FAST for this appointment - nothing to eat or drink after midnight the night before except water.   Testing/Procedures: None Ordered   Follow-Up: Your physician wants you to follow-up in: 1 year with Dr. Nahser.  You will receive a reminder letter in the mail two months in advance. If you don't receive a letter, please call our office to schedule the follow-up appointment.   If you need a refill on your cardiac medications before your next appointment, please call your pharmacy.   Thank you for choosing [facility]! Abdias Hickam, RN [phone]    

## 2015-10-08 DIAGNOSIS — Z6823 Body mass index (BMI) 23.0-23.9, adult: Secondary | ICD-10-CM | POA: Diagnosis not present

## 2015-10-08 DIAGNOSIS — R0781 Pleurodynia: Secondary | ICD-10-CM | POA: Diagnosis not present

## 2016-03-03 ENCOUNTER — Telehealth: Payer: Self-pay | Admitting: Cardiovascular Disease

## 2016-03-03 NOTE — Telephone Encounter (Signed)
New Message  No order in for labs pt is wanting 3.21.18 scheduled for labs for lipid and cmet.

## 2016-03-03 NOTE — Telephone Encounter (Signed)
Noted. Patient has requested sooner appointment with Dr. Acie Fredrickson for 3/1 for chest pain.

## 2016-03-05 ENCOUNTER — Ambulatory Visit (INDEPENDENT_AMBULATORY_CARE_PROVIDER_SITE_OTHER): Payer: BLUE CROSS/BLUE SHIELD | Admitting: Cardiovascular Disease

## 2016-03-05 ENCOUNTER — Encounter (INDEPENDENT_AMBULATORY_CARE_PROVIDER_SITE_OTHER): Payer: Self-pay

## 2016-03-05 ENCOUNTER — Encounter: Payer: Self-pay | Admitting: Cardiovascular Disease

## 2016-03-05 VITALS — BP 132/88 | HR 63 | Ht 66.0 in | Wt 146.0 lb

## 2016-03-05 DIAGNOSIS — R0789 Other chest pain: Secondary | ICD-10-CM | POA: Diagnosis not present

## 2016-03-05 DIAGNOSIS — R635 Abnormal weight gain: Secondary | ICD-10-CM | POA: Diagnosis not present

## 2016-03-05 DIAGNOSIS — E782 Mixed hyperlipidemia: Secondary | ICD-10-CM

## 2016-03-05 NOTE — Progress Notes (Signed)
Cardiology Office Note   Date:  03/05/2016   ID:  [name], DOB 09-19-1955, MRN [id number]  PCP:  [name], MD  Cardiologist:   Mertie Moores, MD   Chief Complaint  Patient presents with  . Chest Pain   Problem List: 1. Hypercholesterolemia 2. Family Hx of CAD  He's been quite active. She's back playing tennis and is doing well. She denies any chest pain or shortness breath. She works out on a regular basis and is able to achieve a peak heart rate 185 160 without too much difficulty. She works out for 30 minutes at a time doing these exercises. She's also walking in the evenings with her husband.   March 23, 2013:  Staceyann is doing OK. She has stopped her Atrovastatin. - she ran out and could not get back into see me.  Her LDL was 181 this last time. Her previous LDL was 108. She admits that her diet has not been as good and she has not been working out as much.   March 23, 2014:  Dua Whinery is a 61 y.o. female who presents for follow up of her hyperlipidemia.    She's done very well. She's exercising readily. She's lost a little bit of weight. She's not having any episodes of chest pain.  April 05, 2015:  Julionna is seen today for follow up of her hyperlipdiemia. Staying active - golf and tennis regularly .  Walks regularly BP is up slighlty .  March 05, 2016:  Presents with stabbing like chest pain.  Quick jabs. At rest . Doesn't increase with activity. Has not been exercising as much.   Walking does not increase pain .   Not related to eating.  Not related to taking a deep breath.   Has gained some weight ( 10 lbs )  Has not been taking the atorvastatin as regularly  Has been present for 3 weeks .   Occurs almost daily .    Past Medical History:  Diagnosis Date  . Abdominal distention   . Burping    excessive burping   . Diarrhea   . GERD (gastroesophageal reflux disease)   . Hyperlipidemia   . 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 BLADDER SURGERY    . CHOLECYSTECTOMY  2012   laparoscopic  . EUS  02/11/2011   Procedure: UPPER ENDOSCOPIC ULTRASOUND (EUS) LINEAR;  Surgeon: Landry Dyke, MD;  Location: [facility];  Service: Endoscopy;  Laterality: N/A;  mac  . US ECHOCARDIOGRAPHY  11/03/2006   EF 55-60%     Current Outpatient Prescriptions  Medication Sig Dispense Refill  . atorvastatin (LIPITOR) 40 MG tablet Take 1 tablet (40 mg total) by mouth daily. 90 tablet 3  . Cholecalciferol (VITAMIN D) 2000 UNITS tablet Take 2,000 Units by mouth daily.     No current facility-administered medications for this visit.     Allergies:   Influenza vaccines    Social History:  The patient  reports that she has never smoked. She has never used smokeless tobacco. She reports that she does not drink alcohol or use drugs.   Family History:  The patient's family history includes Cancer in her paternal grandmother; Heart disease in her mother; Hypertension in her father and mother.    ROS:  Please see the history of present illness.    Review of Systems: Constitutional:  denies fever, chills,  diaphoresis, appetite change and fatigue.  HEENT: denies photophobia, eye pain, redness, hearing loss, ear pain, congestion, sore throat, rhinorrhea, sneezing, neck pain, neck stiffness and tinnitus.  Respiratory: denies SOB, DOE, cough, chest tightness, and wheezing.  Cardiovascular: denies chest pain, palpitations and leg swelling.  Gastrointestinal: denies nausea, vomiting, abdominal pain, diarrhea, constipation, blood in stool.  Genitourinary: denies dysuria, urgency, frequency, hematuria, flank pain and difficulty urinating.  Musculoskeletal: denies  myalgias, back pain, joint swelling, arthralgias and gait problem.   Skin: denies pallor, rash and wound.  Neurological: denies dizziness, seizures, syncope, weakness, light-headedness, numbness and  headaches.   Hematological: denies adenopathy, easy bruising, personal or family bleeding history.  Psychiatric/ Behavioral: denies suicidal ideation, mood changes, confusion, nervousness, sleep disturbance and agitation.       All other systems are reviewed and negative.    PHYSICAL EXAM: VS:  BP 132/88 (BP Location: Left Arm, Patient Position: Sitting, Cuff Size: Normal)   Pulse 63   Ht _0  (1.676 m)   Wt 146 lb (66.2 kg)   SpO2 97%   BMI 23.57 kg/m  , BMI Body mass index is 23.57 kg/m. GEN: Well nourished, well developed, in no acute distress  HEENT: normal  Neck: no JVD, carotid bruits, or masses Cardiac: RRR; no murmurs, rubs, or gallops,no edema  Respiratory:  clear to auscultation bilaterally, normal work of breathing GI: soft, nontender, nondistended, + BS MS: no deformity or atrophy  Skin: warm and dry, no rash Neuro:  Strength and sensation are intact Psych: normal   EKG:  EKG is ordered today. The ekg ordered today demonstrates NSR at 63.   No ST or T wave changes.     Recent Labs: 04/04/2015: ALT 10; BUN 17; Creat 0.90; Potassium 4.4; Sodium 138    Lipid Panel    Component Value Date/Time   CHOL 185 04/04/2015 0905   TRIG 72 04/04/2015 0905   HDL 68 04/04/2015 0905   CHOLHDL 2.7 04/04/2015 0905   VLDL 14 04/04/2015 0905   LDLCALC 103 04/04/2015 0905   LDLDIRECT 147.6 01/27/2011 0902      Wt Readings from Last 3 Encounters:  03/05/16 146 lb (66.2 kg)  04/05/15 139 lb 6.4 oz (63.2 kg)  03/23/14 133 lb 6.4 oz (60.5 kg)      Other studies Reviewed: Additional studies/ records that were reviewed today include: . Review of the above records demonstrates:    ASSESSMENT AND PLAN:  1. Chest pain: Sonji  presents today for further evaluation of some chest pain. These are fairly atypical. They're sharp, jabbing-like chest pains. These are not associated with exertion. She has them at various times.  We'll have her try taking omeprazole or Pepcid  Complete for the next  week or 2 to see if this helps. She should continue with her diet, exercise, and weight loss plans. She's gained a little bit of weight over the past several months and is planning on losing that soon.  2.  Hyperlipidemia: Carol is doing well. Her labs are ok on the current dose of atorvastatin. She admits that she does not take it daily .  Continue atorvastatin. She'll continue with a good diet and exercise program. I'll see her again in one year.    Current medicines are reviewed at length with the patient today.  The patient does not have concerns regarding medicines.  The following changes have been made:  no change  Labs/ tests ordered today include:   Orders Placed This Encounter  Procedures  . TSH  . Comp Met (CMET)  . Lipid Profile  . EKG 12-Lead     Disposition:   FU with me in 1 year .     Signed, Mertie Moores, MD  03/05/2016 2:13 PM    [facility] [address] [place], [place], [place]  [address] Phone: [phone]; Fax: [phone]

## 2016-03-05 NOTE — Patient Instructions (Signed)
Medication Instructions:  Your physician recommends that you continue on your current medications as directed. Please refer to the Current Medication list given to you today.   Labwork: Your physician recommends that you return for lab work TOMORROW for fasting cholesterol, complete metabolic panel, TSH   Testing/Procedures: None Ordered   Follow-Up: Your physician wants you to follow-up in: 1 year with Dr. Acie Fredrickson.  You will receive a reminder letter in the mail two months in advance. If you don't receive a letter, please call our office to schedule the follow-up appointment.   If you need a refill on your cardiac medications before your next appointment, please call your pharmacy.   Thank you for choosing [facility]! Christen Bame, RN [phone]

## 2016-03-06 ENCOUNTER — Other Ambulatory Visit: Payer: BLUE CROSS/BLUE SHIELD

## 2016-03-09 ENCOUNTER — Other Ambulatory Visit: Payer: BLUE CROSS/BLUE SHIELD | Admitting: *Deleted

## 2016-03-09 DIAGNOSIS — R0789 Other chest pain: Secondary | ICD-10-CM | POA: Diagnosis not present

## 2016-03-09 DIAGNOSIS — E782 Mixed hyperlipidemia: Secondary | ICD-10-CM | POA: Diagnosis not present

## 2016-03-09 DIAGNOSIS — R635 Abnormal weight gain: Secondary | ICD-10-CM

## 2016-03-09 LAB — COMPREHENSIVE METABOLIC PANEL
ALT: 12 IU/L (ref 0–32)
AST: 14 IU/L (ref 0–40)
Albumin/Globulin Ratio: 2.3 — ABNORMAL HIGH (ref 1.2–2.2)
Albumin: 4.3 g/dL (ref 3.5–5.5)
Alkaline Phosphatase: 98 IU/L (ref 39–117)
BILIRUBIN TOTAL: 0.5 mg/dL (ref 0.0–1.2)
BUN/Creatinine Ratio: 25 — ABNORMAL HIGH (ref 9–23)
BUN: 22 mg/dL (ref 6–24)
CHLORIDE: 100 mmol/L (ref 96–106)
CO2: 21 mmol/L (ref 18–29)
Calcium: 9.5 mg/dL (ref 8.7–10.2)
Creatinine, Ser: 0.87 mg/dL (ref 0.57–1.00)
GFR calc Af Amer: 85 mL/min/{1.73_m2} (ref >59)
GFR calc non Af Amer: 74 mL/min/{1.73_m2} (ref >59)
GLUCOSE: 92 mg/dL (ref 65–99)
Globulin, Total: 1.9 g/dL (ref 1.5–4.5)
Potassium: 4.3 mmol/L (ref 3.5–5.2)
Sodium: 140 mmol/L (ref 134–144)
Total Protein: 6.2 g/dL (ref 6.0–8.5)

## 2016-03-09 LAB — TSH: TSH: 3.47 u[IU]/mL (ref 0.450–4.500)

## 2016-03-09 LAB — LIPID PANEL
CHOLESTEROL TOTAL: 178 mg/dL (ref 100–199)
Chol/HDL Ratio: 3.3 ratio units (ref 0.0–4.4)
HDL: 54 mg/dL (ref >39)
LDL Calculated: 110 mg/dL — ABNORMAL HIGH (ref 0–99)
Triglycerides: 68 mg/dL (ref 0–149)
VLDL CHOLESTEROL CAL: 14 mg/dL (ref 5–40)

## 2016-03-10 ENCOUNTER — Telehealth: Payer: Self-pay | Admitting: Cardiovascular Disease

## 2016-03-10 NOTE — Telephone Encounter (Signed)
New message    Pt is calling asking that her lab results be mailed to her.

## 2016-03-10 NOTE — Telephone Encounter (Signed)
Lab results printed and placed in outgoing mail to patient's home address.

## 2016-03-26 ENCOUNTER — Ambulatory Visit: Payer: BLUE CROSS/BLUE SHIELD | Admitting: Cardiovascular Disease

## 2016-03-27 DIAGNOSIS — L821 Other seborrheic keratosis: Secondary | ICD-10-CM | POA: Diagnosis not present

## 2016-03-27 DIAGNOSIS — D2262 Melanocytic nevi of left upper limb, including shoulder: Secondary | ICD-10-CM | POA: Diagnosis not present

## 2016-03-27 DIAGNOSIS — L718 Other rosacea: Secondary | ICD-10-CM | POA: Diagnosis not present

## 2016-03-27 DIAGNOSIS — D2261 Melanocytic nevi of right upper limb, including shoulder: Secondary | ICD-10-CM | POA: Diagnosis not present

## 2016-04-17 ENCOUNTER — Ambulatory Visit (INDEPENDENT_AMBULATORY_CARE_PROVIDER_SITE_OTHER): Payer: BLUE CROSS/BLUE SHIELD | Admitting: Podiatry

## 2016-04-17 ENCOUNTER — Encounter: Payer: Self-pay | Admitting: Podiatry

## 2016-04-17 VITALS — BP 124/77 | HR 63

## 2016-04-17 DIAGNOSIS — L84 Corns and callosities: Secondary | ICD-10-CM | POA: Diagnosis not present

## 2016-04-17 DIAGNOSIS — M2041 Other hammer toe(s) (acquired), right foot: Secondary | ICD-10-CM | POA: Diagnosis not present

## 2016-04-17 NOTE — Progress Notes (Signed)
   Subjective:    Patient ID: [name], female    DOB: 1955/04/23, 61 y.o.   MRN: [id number]  HPI this patient presents the office with chief complaint of a painful skin lesion on the inside of the fourth toe, right foot. She says that it has developed now that she has increased her activity and taken up hiking. She says that is painful walking and during activity such as tennis.  She says this skin lesion is painful whenever she wears any of her shoes. She presents the office today for definitive evaluation and treatment of this condition    Review of Systems  All other systems reviewed and are negative.      Objective:   Physical Exam GENERAL APPEARANCE: Alert, conversant. Appropriately groomed. No acute distress.  VASCULAR: Pedal pulses are  palpable at  [facility] and PT bilateral.  Capillary refill time is immediate to all digits,  Normal temperature gradient.   NEUROLOGIC: sensation is normal to 5.07 monofilament at 5/5 sites bilateral.  Light touch is intact bilateral, Muscle strength normal.  MUSCULOSKELETAL: acceptable muscle strength, tone and stability bilateral.  Intrinsic muscluature intact bilateral.  Rectus appearance of foot and digits noted bilateral.  Hammer toe 4th toe right foot  DERMATOLOGIC: skin color, texture, and turgor are within normal limits.  No preulcerative lesions or ulcers  are seen, no interdigital maceration noted.  No open lesions present.  Digital nails are asymptomatic. No drainage noted. Corn noted medial aspect fourth toe right foot at the level  DIPJ.         Assessment & Plan:  Corn secondary to hammer toe.  IE  Debride corn.  Padding was dispensed.  RTC prn   Gardiner Barefoot DPM

## 2016-04-25 ENCOUNTER — Other Ambulatory Visit: Payer: Self-pay | Admitting: Cardiovascular Disease

## 2016-04-25 DIAGNOSIS — E785 Hyperlipidemia, unspecified: Secondary | ICD-10-CM

## 2016-07-23 DIAGNOSIS — Z01419 Encounter for gynecological examination (general) (routine) without abnormal findings: Secondary | ICD-10-CM | POA: Diagnosis not present

## 2016-07-23 DIAGNOSIS — Z6823 Body mass index (BMI) 23.0-23.9, adult: Secondary | ICD-10-CM | POA: Diagnosis not present

## 2016-07-23 DIAGNOSIS — Z1231 Encounter for screening mammogram for malignant neoplasm of breast: Secondary | ICD-10-CM | POA: Diagnosis not present

## 2016-07-30 DIAGNOSIS — E784 Other hyperlipidemia: Secondary | ICD-10-CM | POA: Diagnosis not present

## 2016-07-30 DIAGNOSIS — M810 Age-related osteoporosis without current pathological fracture: Secondary | ICD-10-CM | POA: Diagnosis not present

## 2016-08-04 DIAGNOSIS — Z8249 Family history of ischemic heart disease and other diseases of the circulatory system: Secondary | ICD-10-CM | POA: Diagnosis not present

## 2016-08-04 DIAGNOSIS — M810 Age-related osteoporosis without current pathological fracture: Secondary | ICD-10-CM | POA: Diagnosis not present

## 2016-08-04 DIAGNOSIS — Z0000 Encounter for general adult medical examination without abnormal findings: Secondary | ICD-10-CM | POA: Diagnosis not present

## 2016-08-04 DIAGNOSIS — Z1389 Encounter for screening for other disorder: Secondary | ICD-10-CM | POA: Diagnosis not present

## 2016-08-04 DIAGNOSIS — E784 Other hyperlipidemia: Secondary | ICD-10-CM | POA: Diagnosis not present

## 2016-08-14 DIAGNOSIS — H524 Presbyopia: Secondary | ICD-10-CM | POA: Diagnosis not present

## 2016-12-01 ENCOUNTER — Emergency Department (HOSPITAL_COMMUNITY): Payer: 59

## 2016-12-01 ENCOUNTER — Encounter (HOSPITAL_COMMUNITY): Payer: Self-pay

## 2016-12-01 ENCOUNTER — Emergency Department (HOSPITAL_COMMUNITY)
Admission: EM | Admit: 2016-12-01 | Discharge: 2016-12-01 | Disposition: A | Discharge status: Home / Self Care | Payer: 59 | Attending: Emergency Medicine | Admitting: Emergency Medicine

## 2016-12-01 ENCOUNTER — Other Ambulatory Visit: Payer: Self-pay

## 2016-12-01 DIAGNOSIS — R200 Anesthesia of skin: Secondary | ICD-10-CM | POA: Insufficient documentation

## 2016-12-01 DIAGNOSIS — E785 Hyperlipidemia, unspecified: Secondary | ICD-10-CM | POA: Diagnosis not present

## 2016-12-01 DIAGNOSIS — Z79899 Other long term (current) drug therapy: Secondary | ICD-10-CM | POA: Insufficient documentation

## 2016-12-01 LAB — TROPONIN I

## 2016-12-01 LAB — I-STAT BETA HCG BLOOD, ED (MC, WL, AP ONLY): I-stat hCG, quantitative: <5 m[IU]/mL (ref <5)

## 2016-12-01 LAB — CBC
HCT: 42 % (ref 36.0–46.0)
HEMOGLOBIN: 14.1 g/dL (ref 12.0–15.0)
MCH: 30.1 pg (ref 26.0–34.0)
MCHC: 33.6 g/dL (ref 30.0–36.0)
MCV: 89.7 fL (ref 78.0–100.0)
PLATELETS: 308 10*3/uL (ref 150–400)
RBC: 4.68 MIL/uL (ref 3.87–5.11)
RDW: 12.7 % (ref 11.5–15.5)
WBC: 4.2 10*3/uL (ref 4.0–10.5)

## 2016-12-01 LAB — BASIC METABOLIC PANEL
ANION GAP: 8 (ref 5–15)
BUN: 14 mg/dL (ref 6–20)
CALCIUM: 9.4 mg/dL (ref 8.9–10.3)
CO2: 28 mmol/L (ref 22–32)
CREATININE: 0.82 mg/dL (ref 0.44–1.00)
Chloride: 102 mmol/L (ref 101–111)
GFR calc Af Amer: >60 mL/min (ref >60)
GLUCOSE: 99 mg/dL (ref 65–99)
Potassium: 4 mmol/L (ref 3.5–5.1)
Sodium: 138 mmol/L (ref 135–145)

## 2016-12-01 LAB — I-STAT TROPONIN, ED: TROPONIN I, POC: 0 ng/mL (ref 0.00–0.08)

## 2016-12-01 MED ORDER — PREDNISONE 10 MG PO TABS: ORAL_TABLET | ORAL | 0 refills | Status: DC

## 2016-12-01 NOTE — ED Triage Notes (Signed)
Patient complains of left arm numbness and feeling as if chest feels tight since last night, denies pain, no SOB. Alert and oriented, NAD

## 2016-12-01 NOTE — ED Notes (Signed)
Patient placed in gown

## 2016-12-01 NOTE — Discharge Instructions (Signed)
Your Brain Mri appears normal.  The MRi of your neck shows bulging disc and some degenerative disc disease.  The radiologist has advised that you need to have an outpatient  ultrasound of your thyroid.  See your Physician for recheck in 2-3 days.   Return if any problems.

## 2016-12-01 NOTE — Consult Note (Signed)
Cardiology Consultation:   Patient ID: [name]; [id number]; [birth year]   Admit date: 12/01/2016 Date of Consult: 12/01/2016  Primary Care Provider: [name], MD Primary Cardiologist: No primary care provider on file.  Nahser  Primary Electrophysiologist:     Patient Profile:   [name] is a 61 y.o. female with a hx of hyperlipidemia and atypical chest pain who is being seen today for the evaluation of left arm tingling and numbness at the request of Dr. Rogene Houston.  History of Present Illness:   [name] is a 61 year old female who have known for many years.  She has a strong family history of premature coronary artery disease.  She has had hyperlipidemia that is been fairly well controlled.  She has had episodes of atypical chest pain in the past.  Last night she developed some left arm tingling and numbness.  She thought that it felt like her arm was asleep.  She denied any chest discomfort.  She tried to shake out her arm but this did not help the tingling.  She also tried repositioning it and massaging it.  She denies any chest pain or shortness of breath.  She denies PND or orthopnea.  She denies any neck pain but has had some scapular pain several weeks ago for which she took Motrin.  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 She denies any fever or cough.  She denies any syncope or presyncope.  She has had some nausea and indigestion  Past Medical History:  Diagnosis Date  . Abdominal distention   . Burping    excessive burping   . Diarrhea   . GERD (gastroesophageal reflux disease)   . Hyperlipidemia   . 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 BLADDER SURGERY    . CHOLECYSTECTOMY  2012   laparoscopic  . EUS  02/11/2011   Procedure: UPPER ENDOSCOPIC ULTRASOUND (EUS) LINEAR;  Surgeon: Landry Dyke, MD;   Location: [facility];  Service: Endoscopy;  Laterality: N/A;  mac  . US ECHOCARDIOGRAPHY  11/03/2006   EF 55-60%     Home Medications:  Prior to Admission medications   Medication Sig Start Date End Date Taking? Authorizing Provider  alendronate (FOSAMAX) 70 MG tablet Take 70 mg by mouth once a week. Take with a full glass of water on an empty stomach.   Yes [provider]  atorvastatin (LIPITOR) 40 MG tablet TAKE 1 TABLET(40 MG) BY MOUTH DAILY 04/27/16  Yes Nahser, Wonda Cheng, MD  Cholecalciferol (VITAMIN D) 2000 UNITS tablet Take 2,000 Units by mouth daily.   Yes [provider]  ibuprofen (ADVIL,MOTRIN) 200 MG tablet Take 200 mg by mouth every 6 (six) hours as needed for moderate pain.   Yes [provider]  ofloxacin (OCUFLOX) 0.3 % ophthalmic solution Place 1-2 drops into both eyes every 4 (four) hours as needed (conjunctivitis).   Yes [provider]    Inpatient Medications: Scheduled Meds:  Continuous Infusions:  PRN Meds:   Allergies:    Allergies  Allergen Reactions  . Influenza Vaccines Nausea And Vomiting    Vadie reporte a severe allergic reaction to the influenza vaccine ( severe headache, nausea, vomitting)     Social History:   Social History   Socioeconomic History  . Marital status: Married    Spouse name: Not on  file  . Number of children: 3  . Years of education: Not on file  . Highest education level: Not on file  Social Needs  . Financial resource strain: Not on file  . Food insecurity - worry: Not on file  . Food insecurity - inability: Not on file  . Transportation needs - medical: Not on file  . Transportation needs - non-medical: Not on file  Occupational History  . Occupation: Homemaker  Tobacco Use  . Smoking status: Never Smoker  . Smokeless tobacco: Never Used  Substance and Sexual Activity  . Alcohol use: No  . Drug use: No  . Sexual activity: Not on file  Other Topics Concern  . Not on file    Social History Narrative   0 caffeine drinks     Family History:    Family History  Problem Relation Age of Onset  . Heart disease Mother   . Hypertension Mother   . Hypertension Father   . Cancer Paternal Grandmother        Gallbladder  . Colon cancer Neg Hx      ROS:  Please see the history of present illness.  ROS  All other ROS reviewed and negative.     Physical Exam/Data:   Vitals:   12/01/16 0918  BP: 130/81  Pulse: 76  Resp: 16  Temp: 98.4 F (36.9 C)  TempSrc: Oral  SpO2: 100%  Weight: 145 lb (65.8 kg)  Height: _0  (1.676 m)   No intake or output data in the 24 hours ending 12/01/16 1122 Filed Weights   12/01/16 0918  Weight: 145 lb (65.8 kg)   Body mass index is 23.4 kg/m.  General:  Well nourished, well developed, in no acute distress HEENT: normal Lymph: no adenopathy Neck: no JVD Endocrine:  No thryomegaly Vascular: No carotid bruits; FA pulses 2+ bilaterally without bruits  Cardiac:  normal S1, S2; RRR; no murmur.  No chest wall tenderness. Lungs:  clear to auscultation bilaterally, no wheezing, rhonchi or rales  Abd: soft, nontender, no hepatomegaly  Ext: no edema Musculoskeletal:  No deformities, BUE and BLE strength normal and equal Skin: warm and dry  Neuro:  CNs 2-12 intact, no focal abnormalities noted Psych:  Normal affect   EKG:  The EKG was personally reviewed and demonstrates: Normal sinus rhythm at 77.  She has nonspecific ST and T wave changes which are unchanged from all of her previous EKGs. Telemetry:  Telemetry was personally reviewed and demonstrates: Normal sinus rhythm  Relevant CV Studies:   Laboratory Data:  Chemistry Recent Labs  Lab 12/01/16 0922  NA 138  K 4.0  CL 102  CO2 28  GLUCOSE 99  BUN 14  CREATININE 0.82  CALCIUM 9.4  GFRNONAA >60  GFRAA >60  ANIONGAP 8    No results for input(s): PROT, ALBUMIN, AST, ALT, ALKPHOS, BILITOT in the last 168 hours. Hematology Recent Labs  Lab  12/01/16 0922  WBC 4.2  RBC 4.68  HGB 14.1  HCT 42.0  MCV 89.7  MCH 30.1  MCHC 33.6  RDW 12.7  PLT 308   Cardiac EnzymesNo results for input(s): TROPONINI in the last 168 hours.  Recent Labs  Lab 12/01/16 0940  TROPIPOC 0.00    BNPNo results for input(s): BNP, PROBNP in the last 168 hours.  DDimer No results for input(s): DDIMER in the last 168 hours.  Radiology/Studies:  Dg Chest 2 View  Result Date: 12/01/2016 CLINICAL DATA:  Left arm numbness, chest  tightness EXAM: CHEST  2 VIEW COMPARISON:  Chest x-ray of 10/23/2010 FINDINGS: No active infiltrate or effusion is seen. A vague rounded opacity overlying the left upper lung most likely represents overlying button, being very rounded. Mediastinal and hilar contours are unremarkable. The heart is within normal limits in size. No acute bony abnormality is seen. IMPRESSION: No active cardiopulmonary disease. Probable overlying artifact in the left upper chest. Electronically Signed   By: Ivar Drape M.D.   On: 12/01/2016 09:47    Assessment and Plan:   1. 1.  Left arm numbness: The patient's troponin level is negative.  Her EKG is nonacute.  She does have some nonspecific changes but these are unchanged from most of her previous EKGs over the past years.  She is not having any chest pain.   We will get a delta troponin  4 hours from the last level.  I will see her back in the office for further workup as needed.  We will consider getting a coronary CT angiogram as an outpatient.  2.  Left arm numbness.  We will have the emergency room physicians see her for further evaluation of this left arm numbness.  It appears to be a noncardiac issue at this time.  3.  Hyperlipidemia: Continue current medications.  We will follow-up in the office.   For questions or updates, please contact [place] Please consult [email or web address] for contact info under Cardiology/STEMI.   Signed, Mertie Moores, MD  12/01/2016 11:22 AM

## 2016-12-01 NOTE — ED Provider Notes (Signed)
[facility] EMERGENCY DEPARTMENT Provider Note   CSN: [id number] Arrival date & time: 12/01/16  2119     History   Chief Complaint No chief complaint on file.   HPI [name] is a 61 y.o. female.  Pt complains of numbness and tingling in her left arm.  Pt reports she noticed last night about 11pm.  Pt reports numbness has been constant since 11 PM.  Patient reports her mother has coronary artery disease and she became concerned because her mother has had numbness and tingling and pain in her left arm when she has had problems with her heart.  Patient reports she called her cardiologist Dr. Acie Fredrickson who advised her to come to the emergency department for evaluation.  Patient was seen here by Dr. Acie Fredrickson at triage.  He reports to me he does not feel like this is a cardiac issue.  He reports if patient has a negative second troponin he does not feel she needs further cardiac evaluation.  He expresses concern that this could be radicular in nature.  Patient informs me she does have some crunching in her neck when she turns her head side to side.  She has had previous episodes of neck pain which have been treated and resolved.  Patient denies any current chest pain.  She denies any weakness she reports she has full function of her arms and her legs.   The history is provided by the patient. No language interpreter was used.    Past Medical History:  Diagnosis Date  . Abdominal distention   . Burping    excessive burping   . Diarrhea   . GERD (gastroesophageal reflux disease)   . Hyperlipidemia   . 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 Other chest pain 03/05/2016  . Hyperlipidemia 01/29/2011  . Nonspecific elevation of levels of transaminase or lactic acid dehydrogenase (LDH) 11/12/2010  . Esophageal reflux 11/12/2010    Past Surgical History:  Procedure Laterality Date  . BLADDER  SURGERY    . CHOLECYSTECTOMY  2012   laparoscopic  . EUS  02/11/2011   Procedure: UPPER ENDOSCOPIC ULTRASOUND (EUS) LINEAR;  Surgeon: Landry Dyke, MD;  Location: [facility];  Service: Endoscopy;  Laterality: N/A;  mac  . US ECHOCARDIOGRAPHY  11/03/2006   EF 55-60%    OB History    No data available       Home Medications    Prior to Admission medications   Medication Sig Start Date End Date Taking? Authorizing Provider  alendronate (FOSAMAX) 70 MG tablet Take 70 mg by mouth once a week. Take with a full glass of water on an empty stomach.   Yes [provider]  atorvastatin (LIPITOR) 40 MG tablet TAKE 1 TABLET(40 MG) BY MOUTH DAILY 04/27/16  Yes Nahser, Wonda Cheng, MD  Cholecalciferol (VITAMIN D) 2000 UNITS tablet Take 2,000 Units by mouth daily.   Yes [provider]  ibuprofen (ADVIL,MOTRIN) 200 MG tablet Take 200 mg by mouth every 6 (six) hours as needed for moderate pain.   Yes [provider]  ofloxacin (OCUFLOX) 0.3 % ophthalmic solution Place 1-2 drops into both eyes every 4 (four) hours as needed (conjunctivitis).   Yes [provider]  predniSONE (DELTASONE) 10 MG tablet 6,5,4,3,2,1 taper 12/01/16   Fransico Meadow, PA-C    Family History Family History  Problem Relation Age of Onset  .  Heart disease Mother   . Hypertension Mother   . Hypertension Father   . Cancer Paternal Grandmother        Gallbladder  . Colon cancer Neg Hx     Social History Social History   Tobacco Use  . Smoking status: Never Smoker  . Smokeless tobacco: Never Used  Substance Use Topics  . Alcohol use: No  . Drug use: No     Allergies   Influenza vaccines   Review of Systems Review of Systems  All other systems reviewed and are negative.    Physical Exam Updated Vital Signs BP 124/67 (BP Location: Right Arm)   Pulse 72   Temp 98.4 F (36.9 C) (Oral)   Resp 16   Ht _0  (1.676 m)   Wt 65.8 kg (145 lb)   SpO2 99%   BMI 23.40  kg/m   Physical Exam  Constitutional: She is oriented to person, place, and time. She appears well-developed and well-nourished.  HENT:  Head: Normocephalic.  Right Ear: External ear normal.  Left Ear: External ear normal.  Nose: Nose normal.  Mouth/Throat: Oropharynx is clear and moist.  Eyes: Conjunctivae and EOM are normal. Pupils are equal, round, and reactive to light.  Neck: Normal range of motion.  Cardiovascular: Normal rate.  Pulmonary/Chest: Breath sounds normal. She is in respiratory distress.  Abdominal: Soft. She exhibits no distension.  Musculoskeletal: Normal range of motion.  Neurological: She is alert and oriented to person, place, and time. She displays normal reflexes. No cranial nerve deficit. She exhibits normal muscle tone.  Skin: Skin is warm. Capillary refill takes less than 2 seconds.  Psychiatric: She has a normal mood and affect. Her behavior is normal.  Nursing note and vitals reviewed.    ED Treatments / Results  Labs (all labs ordered are listed, but only abnormal results are displayed) Labs Reviewed  BASIC METABOLIC PANEL  CBC  TROPONIN I  I-STAT TROPONIN, ED  I-STAT BETA HCG BLOOD, ED ([facility], [facility], [facility] ONLY)    EKG  EKG Interpretation None       Radiology Dg Chest 2 View  Result Date: 12/01/2016 CLINICAL DATA:  Left arm numbness, chest tightness EXAM: CHEST  2 VIEW COMPARISON:  Chest x-ray of 10/23/2010 FINDINGS: No active infiltrate or effusion is seen. A vague rounded opacity overlying the left upper lung most likely represents overlying button, being very rounded. Mediastinal and hilar contours are unremarkable. The heart is within normal limits in size. No acute bony abnormality is seen. IMPRESSION: No active cardiopulmonary disease. Probable overlying artifact in the left upper chest. Electronically Signed   By: Ivar Drape M.D.   On: 12/01/2016 09:47   Mr Brain Wo Contrast  Result Date: 12/01/2016 CLINICAL DATA:  Initial evaluation  for acute left arm numbness. EXAM: MRI HEAD WITHOUT CONTRAST MRI CERVICAL SPINE WITHOUT CONTRAST TECHNIQUE: Multiplanar, multiecho pulse sequences of the brain and surrounding structures, and cervical spine, to include the craniocervical junction and cervicothoracic junction, were obtained without intravenous contrast. COMPARISON:  None available. FINDINGS: MRI HEAD FINDINGS Brain: Cerebral volume within normal limits for age. Few scattered subcentimeter T2/FLAIR hyperintense foci in noted within the periventricular and deep white matter both cerebral hemispheres, nonspecific, but felt to be within normal limits for age. No abnormal foci of restricted diffusion to suggest acute or subacute ischemia. Gray-white matter differentiation well maintained. No encephalomalacia to suggest chronic infarction. No foci of susceptibility artifact to suggest acute or chronic intracranial hemorrhage. No mass lesion,  midline shift or mass effect. No hydrocephalus. No extra-axial fluid collection. Major dural sinuses are grossly patent. Pituitary gland suprasellar region normal. Midline structures intact and normal. Vascular: Major intravascular flow voids are well maintained. Skull and upper cervical spine: Craniocervical junction normal. Bone marrow signal intensity within normal limits. No scalp soft tissue abnormality. Sinuses/Orbits: Globes normal soft tissues within normal limits. Paranasal sinuses are clear. No mastoid effusion. Inner ear structures normal. Other: None. MRI CERVICAL SPINE FINDINGS Alignment: Vertebral bodies normally aligned with preservation of the normal cervical lordosis. No listhesis. Vertebrae: Vertebral body heights well maintained without evidence for acute or chronic fracture. Bone marrow signal intensity within normal limits. No discrete or worrisome osseous lesions. No abnormal marrow edema. Cord: Signal intensity within the cervical spinal cord is normal. Posterior Fossa, vertebral arteries,  paraspinal tissues: Craniocervical junction normal. Paraspinous and prevertebral soft tissues within normal limits. Normal intravascular flow voids present within the vertebral arteries bilaterally. 15 mm heterogeneous right thyroid nodule, indeterminate. Disc levels: C2-C3: Unremarkable. C3-C4: Mild disc bulge with bilateral uncovertebral hypertrophy. No significant stenosis. C4-C5: Mild diffuse disc bulge with bilateral uncovertebral hypertrophy. No significant canal stenosis. No foraminal encroachment. C5-C6: Mild diffuse disc bulge. Mild uncovertebral hypertrophy. No significant spinal stenosis. Mild bilateral C6 foraminal narrowing, slightly worse on the left. C6-C7:  Mild disc bulge.  No stenosis. C7-T1:  Unremarkable. Visualized upper thoracic spine is within normal limits. IMPRESSION: MRI HEAD IMPRESSION: Normal brain MRI for age. No acute intracranial abnormality identified. MRI CERVICAL SPINE IMPRESSION: 1. No acute abnormality within the cervical spine. No findings to explain patient's symptoms identified. 2. Mild disc bulging at C3-4 through C6-7 without significant spinal stenosis. 3. Mild bilateral C6 foraminal narrowing due to disc bulge and uncovertebral disease. No other significant foraminal encroachment within the cervical spine. 4. 15 mm heterogeneous right thyroid not. Follow-up examination with nonemergent thyroid ultrasound recommended for further evaluation. Electronically Signed   By: Jeannine Boga M.D.   On: 12/01/2016 15:58   Mr Cervical Spine Wo Contrast  Result Date: 12/01/2016 CLINICAL DATA:  Initial evaluation for acute left arm numbness. EXAM: MRI HEAD WITHOUT CONTRAST MRI CERVICAL SPINE WITHOUT CONTRAST TECHNIQUE: Multiplanar, multiecho pulse sequences of the brain and surrounding structures, and cervical spine, to include the craniocervical junction and cervicothoracic junction, were obtained without intravenous contrast. COMPARISON:  None available. FINDINGS: MRI HEAD  FINDINGS Brain: Cerebral volume within normal limits for age. Few scattered subcentimeter T2/FLAIR hyperintense foci in noted within the periventricular and deep white matter both cerebral hemispheres, nonspecific, but felt to be within normal limits for age. No abnormal foci of restricted diffusion to suggest acute or subacute ischemia. Gray-white matter differentiation well maintained. No encephalomalacia to suggest chronic infarction. No foci of susceptibility artifact to suggest acute or chronic intracranial hemorrhage. No mass lesion, midline shift or mass effect. No hydrocephalus. No extra-axial fluid collection. Major dural sinuses are grossly patent. Pituitary gland suprasellar region normal. Midline structures intact and normal. Vascular: Major intravascular flow voids are well maintained. Skull and upper cervical spine: Craniocervical junction normal. Bone marrow signal intensity within normal limits. No scalp soft tissue abnormality. Sinuses/Orbits: Globes normal soft tissues within normal limits. Paranasal sinuses are clear. No mastoid effusion. Inner ear structures normal. Other: None. MRI CERVICAL SPINE FINDINGS Alignment: Vertebral bodies normally aligned with preservation of the normal cervical lordosis. No listhesis. Vertebrae: Vertebral body heights well maintained without evidence for acute or chronic fracture. Bone marrow signal intensity within normal limits. No discrete or worrisome osseous lesions.  No abnormal marrow edema. Cord: Signal intensity within the cervical spinal cord is normal. Posterior Fossa, vertebral arteries, paraspinal tissues: Craniocervical junction normal. Paraspinous and prevertebral soft tissues within normal limits. Normal intravascular flow voids present within the vertebral arteries bilaterally. 15 mm heterogeneous right thyroid nodule, indeterminate. Disc levels: C2-C3: Unremarkable. C3-C4: Mild disc bulge with bilateral uncovertebral hypertrophy. No significant  stenosis. C4-C5: Mild diffuse disc bulge with bilateral uncovertebral hypertrophy. No significant canal stenosis. No foraminal encroachment. C5-C6: Mild diffuse disc bulge. Mild uncovertebral hypertrophy. No significant spinal stenosis. Mild bilateral C6 foraminal narrowing, slightly worse on the left. C6-C7:  Mild disc bulge.  No stenosis. C7-T1:  Unremarkable. Visualized upper thoracic spine is within normal limits. IMPRESSION: MRI HEAD IMPRESSION: Normal brain MRI for age. No acute intracranial abnormality identified. MRI CERVICAL SPINE IMPRESSION: 1. No acute abnormality within the cervical spine. No findings to explain patient's symptoms identified. 2. Mild disc bulging at C3-4 through C6-7 without significant spinal stenosis. 3. Mild bilateral C6 foraminal narrowing due to disc bulge and uncovertebral disease. No other significant foraminal encroachment within the cervical spine. 4. 15 mm heterogeneous right thyroid not. Follow-up examination with nonemergent thyroid ultrasound recommended for further evaluation. Electronically Signed   By: Jeannine Boga M.D.   On: 12/01/2016 15:58    Procedures Procedures (including critical care time)  Medications Ordered in ED Medications - No data to display   Initial Impression / Assessment and Plan / ED Course  I have reviewed the triage vital signs and the nursing notes.  Pertinent labs & imaging results that were available during my care of the patient were reviewed by me and considered in my medical decision making (see chart for details).     Discussed the patient with Dr. Rogene Houston who advised MRI brain and cervical spine.  Patient has a negative second troponin.  MRI brain per radiologist is essentially normal.  Radiologist reports mild disc bulging at C3-C4 and C6-C7.  He reports no significant spinal stenosis.  He reports mild bilateral C6 foraminal narrowing.  I counseled patient at length I advised her that the radiologist has advised  that she have a nonemergent thyroid ultrasound to evaluate for thyroid nodule.  I have advised patient I will try her on prednisone taper for 6 days she is to see her primary care physician for recheck in 2 days.  She is advised if symptoms persist she may need to be followed by neurology for further testing.  Patient advised to return to the emergency department if symptoms are worsening or changing. Meds ordered this encounter  Medications  . predniSONE (DELTASONE) 10 MG tablet    Sig: 6,5,4,3,2,1 taper    Dispense:  21 tablet    Refill:  0    Order Specific Question:   Supervising Provider    Answer:   Noemi Chapel [[id number]]  An After Visit Summary was printed and given to the patient.   Final Clinical Impressions(s) / ED Diagnoses   Final diagnoses:  Arm numbness left    ED Discharge Orders        Ordered    predniSONE (DELTASONE) 10 MG tablet     12/01/16 1610       Fransico Meadow, [place] 12/01/16 1627    Fredia Sorrow, MD 12/11/16 1620

## 2016-12-01 NOTE — ED Notes (Signed)
Pt returned from MRI.  Pt denies any pain, st's her left arm is still tingling..  Pt alert and oriented x's 3.

## 2016-12-05 ENCOUNTER — Other Ambulatory Visit: Payer: Self-pay | Admitting: Internal Medicine

## 2016-12-05 DIAGNOSIS — E041 Nontoxic single thyroid nodule: Secondary | ICD-10-CM

## 2016-12-11 ENCOUNTER — Ambulatory Visit
Admission: RE | Admit: 2016-12-11 | Discharge: 2016-12-11 | Disposition: A | Discharge status: Home / Self Care | Payer: 59 | Source: Ambulatory Visit | Attending: Internal Medicine | Admitting: Internal Medicine

## 2016-12-11 ENCOUNTER — Telehealth: Payer: Self-pay | Admitting: Family Medicine

## 2016-12-11 DIAGNOSIS — E041 Nontoxic single thyroid nodule: Secondary | ICD-10-CM

## 2016-12-11 NOTE — Telephone Encounter (Signed)
Copied from [facility] #[id number]. Topic: General - Other >> Dec 11, 2016  4:17 PM Valla Leaver wrote: Reason for CRM: Schylar, daughter of a deceased patient of Dr. Raliegh Ip, would like to know why her mom Dionne Bucy had her thyroid removed. Ajai has a nodule on her thyroid and wanted to know if it's genetic. Damyah is not a patient of [facility] but merely wants to speak with a nurse about her mothers thyroid issues before she passed away. Please advise.

## 2016-12-16 NOTE — Telephone Encounter (Signed)
I left a voice message, advised pt to contact her primary care provider to discuss questions & concerns.

## 2016-12-24 ENCOUNTER — Other Ambulatory Visit: Payer: Self-pay | Admitting: Internal Medicine

## 2016-12-24 DIAGNOSIS — E041 Nontoxic single thyroid nodule: Secondary | ICD-10-CM

## 2017-01-07 ENCOUNTER — Other Ambulatory Visit (HOSPITAL_COMMUNITY)
Admission: RE | Admit: 2017-01-07 | Discharge: 2017-01-07 | Disposition: A | Discharge status: Home / Self Care | Payer: 59 | Source: Ambulatory Visit | Attending: General Surgery | Admitting: General Surgery

## 2017-01-07 ENCOUNTER — Ambulatory Visit
Admission: RE | Admit: 2017-01-07 | Discharge: 2017-01-07 | Disposition: A | Discharge status: Home / Self Care | Payer: 59 | Source: Ambulatory Visit | Attending: Internal Medicine | Admitting: Internal Medicine

## 2017-01-07 DIAGNOSIS — E041 Nontoxic single thyroid nodule: Secondary | ICD-10-CM

## 2017-03-02 ENCOUNTER — Telehealth: Payer: Self-pay | Admitting: Cardiovascular Disease

## 2017-03-02 DIAGNOSIS — E782 Mixed hyperlipidemia: Secondary | ICD-10-CM

## 2017-03-02 NOTE — Telephone Encounter (Signed)
Pt called requesting labs before her appt 05/04/17 @ 4p        BMET, hepatic and LIPID please make orders to be scheduled. The day before at 8:45a  Per pt she will be coming in at 7:30a on 05/03/17 to do her lab work.

## 2017-03-02 NOTE — Telephone Encounter (Signed)
Lab orders entered and patient's lab appointment scheduled for 4/29

## 2017-04-23 ENCOUNTER — Encounter: Payer: Self-pay | Admitting: Cardiovascular Disease

## 2017-05-03 ENCOUNTER — Other Ambulatory Visit: Payer: 59 | Admitting: *Deleted

## 2017-05-03 DIAGNOSIS — E782 Mixed hyperlipidemia: Secondary | ICD-10-CM

## 2017-05-03 LAB — BASIC METABOLIC PANEL
BUN/Creatinine Ratio: 15 (ref 9–23)
BUN: 14 mg/dL (ref 6–24)
CO2: 24 mmol/L (ref 20–29)
CREATININE: 0.91 mg/dL (ref 0.57–1.00)
Calcium: 9.3 mg/dL (ref 8.7–10.2)
Chloride: 103 mmol/L (ref 96–106)
GFR calc Af Amer: 80 mL/min/{1.73_m2} (ref >59)
GFR, EST NON AFRICAN AMERICAN: 69 mL/min/{1.73_m2} (ref >59)
Glucose: 88 mg/dL (ref 65–99)
Potassium: 4.7 mmol/L (ref 3.5–5.2)
SODIUM: 139 mmol/L (ref 134–144)

## 2017-05-03 LAB — LIPID PANEL
CHOL/HDL RATIO: 3 ratio (ref 0.0–4.4)
Cholesterol, Total: 172 mg/dL (ref 100–199)
HDL: 58 mg/dL (ref >39)
LDL CALC: 101 mg/dL — AB (ref 0–99)
TRIGLYCERIDES: 65 mg/dL (ref 0–149)
VLDL Cholesterol Cal: 13 mg/dL (ref 5–40)

## 2017-05-03 LAB — HEPATIC FUNCTION PANEL
ALT: 12 IU/L (ref 0–32)
AST: 18 IU/L (ref 0–40)
Albumin: 4.3 g/dL (ref 3.5–5.5)
Alkaline Phosphatase: 73 IU/L (ref 39–117)
BILIRUBIN TOTAL: 0.8 mg/dL (ref 0.0–1.2)
Bilirubin, Direct: 0.18 mg/dL (ref 0.00–0.40)
Total Protein: 6.4 g/dL (ref 6.0–8.5)

## 2017-05-04 ENCOUNTER — Ambulatory Visit: Payer: 59 | Admitting: Cardiovascular Disease

## 2017-05-04 ENCOUNTER — Encounter: Payer: Self-pay | Admitting: Cardiovascular Disease

## 2017-05-04 VITALS — BP 114/64 | HR 56 | Ht 66.0 in | Wt 138.1 lb

## 2017-05-04 DIAGNOSIS — Z8249 Family history of ischemic heart disease and other diseases of the circulatory system: Secondary | ICD-10-CM | POA: Diagnosis not present

## 2017-05-04 DIAGNOSIS — E782 Mixed hyperlipidemia: Secondary | ICD-10-CM | POA: Diagnosis not present

## 2017-05-04 MED ORDER — ROSUVASTATIN CALCIUM 10 MG PO TABS: 10.0000 mg | ORAL_TABLET | Freq: Every day | ORAL | 3 refills | Status: DC

## 2017-05-04 NOTE — Progress Notes (Signed)
Cardiology Office Note   Date:  05/04/2017   ID:  [name], DOB [birth year], MRN [id number]  PCP:  [name], MD  Cardiologist:   Mertie Moores, MD   Chief Complaint  Patient presents with  . Hyperlipidemia   Problem List: 1. Hypercholesterolemia 2. Family Hx of CAD  He's been quite active. She's back playing tennis and is doing well. She denies any chest pain or shortness breath. She works out on a regular basis and is able to achieve a peak heart rate 185 160 without too much difficulty. She works out for 30 minutes at a time doing these exercises. She's also walking in the evenings with her husband.   March 23, 2013:  [name] is doing OK. She has stopped her Atrovastatin. - she ran out and could not get back into see me.  Her LDL was 181 this last time. Her previous LDL was 108. She admits that her diet has not been as good and she has not been working out as much.   March 23, 2014:  [name] is a 62 y.o. female who presents for follow up of her hyperlipidemia.    She's done very well. She's exercising readily. She's lost a little bit of weight. She's not having any episodes of chest pain.  April 05, 2015:  [name] is seen today for follow up of her hyperlipdiemia. Staying active - golf and tennis regularly .  Walks regularly BP is up slighlty .  March 05, 2016:  Presents with stabbing like chest pain.  Quick jabs. At rest . Doesn't increase with activity. Has not been exercising as much.   Walking does not increase pain .   Not related to eating.  Not related to taking a deep breath.   Has gained some weight ( 10 lbs )  Has not been taking the atorvastatin as regularly  Has been present for 3 weeks .   Occurs almost daily .   May 04, 2017:  Doing well Working out regularly .  Feeling well  No chest pains or shortness of breath. Her last lipid levels were overall okay but her LDL is still 101.  I would like for her LDL to be around 54  since she has a family here history of early coronary disease in her mother. She admits that she does not take her atorvastatin every day and thinks she misses doses  Most  weekends.   Past Medical History:  Diagnosis Date  . Abdominal distention   . Burping    excessive burping   . Diarrhea   . GERD (gastroesophageal reflux disease)   . Hyperlipidemia   . 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 BLADDER SURGERY    . CHOLECYSTECTOMY  2012   laparoscopic  . EUS  02/11/2011   Procedure: UPPER ENDOSCOPIC ULTRASOUND (EUS) LINEAR;  Surgeon: Landry Dyke, MD;  Location: [facility];  Service: Endoscopy;  Laterality: N/A;  mac  . US ECHOCARDIOGRAPHY  11/03/2006   EF 55-60%     Current Outpatient Medications  Medication Sig Dispense Refill  . alendronate (FOSAMAX) 70 MG tablet Take 70 mg by mouth once a week. Take with a full glass of water on an empty stomach.    . Cholecalciferol (VITAMIN D) 2000 UNITS tablet Take 2,000 Units by mouth daily.    Marland Kitchen ibuprofen (ADVIL,MOTRIN) 200 MG tablet Take 200 mg by mouth  every 6 (six) hours as needed for moderate pain.    . rosuvastatin (CRESTOR) 10 MG tablet Take 1 tablet (10 mg total) by mouth daily. 90 tablet 3   No current facility-administered medications for this visit.     Allergies:   Influenza vaccines    Social History:  The patient  reports that she has never smoked. She has never used smokeless tobacco. She reports that she does not drink alcohol or use drugs.   Family History:  The patient's family history includes Cancer in her paternal grandmother; Heart disease in her mother; Hypertension in her father and mother.    ROS: Noted in current history, otherwise negative.    Physical Exam: Blood pressure 114/64, pulse (!) 56, height _0  (1.676 m), weight 138 lb 1.9 oz (62.7 kg), SpO2 98 %.  GEN:   Middle age female , NAD  HEENT: Normal NECK:  No JVD; No carotid bruits LYMPHATICS: No lymphadenopathy CARDIAC: RRR   RESPIRATORY:  Clear to auscultation without rales, wheezing or rhonchi  ABDOMEN: Soft, non-tender, non-distended MUSCULOSKELETAL:  No edema; No deformity  SKIN: Warm and dry NEUROLOGIC:  Alert and oriented x 3   EKG:      Recent Labs: 12/01/2016: Hemoglobin 14.1; Platelets 308 05/03/2017: ALT 12; BUN 14; Creatinine, Ser 0.91; Potassium 4.7; Sodium 139    Lipid Panel    Component Value Date/Time   CHOL 172 05/03/2017 0801   TRIG 65 05/03/2017 0801   HDL 58 05/03/2017 0801   CHOLHDL 3.0 05/03/2017 0801   CHOLHDL 2.7 04/04/2015 0905   VLDL 14 04/04/2015 0905   LDLCALC 101 (H) 05/03/2017 0801   LDLDIRECT 147.6 01/27/2011 0902      Wt Readings from Last 3 Encounters:  05/04/17 138 lb 1.9 oz (62.7 kg)  12/01/16 145 lb (65.8 kg)  03/05/16 146 lb (66.2 kg)      Other studies Reviewed: Additional studies/ records that were reviewed today include: . Review of the above records demonstrates:    ASSESSMENT AND PLAN:  1. Chest pain: no further episodes of   2.  Hyperlipidemia: [name] is doing well. Her labs are ok on the current dose of atorvastatin. She admits that she does not take it daily .   LDL is 101.  I would like to have her LDL in the 70 range.  We will try her on rosuvastatin 10 mg a day.  Check fasting labs in 3 months.    Current medicines are reviewed at length with the patient today.  The patient does not have concerns regarding medicines.  The following changes have been made:  no change  Labs/ tests ordered today include:   Orders Placed This Encounter  Procedures  . Lipid Profile  . Basic Metabolic Panel (BMET)  . Hepatic function panel     Disposition:   FU with me in 1 year .     Signed, Mertie Moores, MD  05/04/2017 5:07 PM    [facility] [address], [place], [place]  [address] Phone: [phone]; Fax: [phone]

## 2017-05-04 NOTE — Patient Instructions (Addendum)
Medication Instructions:  Your physician has recommended you make the following change in your medication:   STOP Atorvastatin (Lipitor) START Rosuvastatin 10 mg once daily   Labwork: Your physician recommends that you return for lab work in: 3 months on Friday Aug. 2 between 7:30 am and 4:30 pm You will need to FAST for this appointment - nothing to eat or drink after midnight the night before except water.    Testing/Procedures: None Ordered   Follow-Up: Your physician wants you to follow-up in: 1 year with Dr. Acie Fredrickson. You will receive a reminder letter in the mail two months in advance. If you don't receive a letter, please call our office to schedule the follow-up appointment.   If you need a refill on your cardiac medications before your next appointment, please call your pharmacy.   Thank you for choosing [facility]! Christen Bame, RN [phone]

## 2017-09-22 DIAGNOSIS — H6123 Impacted cerumen, bilateral: Secondary | ICD-10-CM | POA: Diagnosis not present

## 2017-09-22 DIAGNOSIS — H6981 Other specified disorders of Eustachian tube, right ear: Secondary | ICD-10-CM | POA: Diagnosis not present

## 2018-01-20 ENCOUNTER — Telehealth: Payer: Self-pay | Admitting: Cardiovascular Disease

## 2018-01-20 DIAGNOSIS — E782 Mixed hyperlipidemia: Secondary | ICD-10-CM

## 2018-01-20 DIAGNOSIS — Z8249 Family history of ischemic heart disease and other diseases of the circulatory system: Secondary | ICD-10-CM

## 2018-01-20 NOTE — Telephone Encounter (Signed)
ew Message:      Pt is seeing Dr Kristopher Oppenheim on -06-01-18. She would like for you to order her lab work so she can have it on 05-28-18 please.h

## 2018-01-20 NOTE — Telephone Encounter (Signed)
Follow Up:    The correct date for pt's appt is 06-03-18 with Dr Acie Fredrickson and her lab is 06-01-18.

## 2018-01-20 NOTE — Telephone Encounter (Signed)
Labs have been ordered and attached to patient's lab appointment

## 2018-05-11 ENCOUNTER — Other Ambulatory Visit: Payer: Self-pay | Admitting: Cardiovascular Disease

## 2018-05-11 MED ORDER — ROSUVASTATIN CALCIUM 10 MG PO TABS: 10.0000 mg | ORAL_TABLET | Freq: Every day | ORAL | 0 refills | Status: DC

## 2018-05-27 ENCOUNTER — Telehealth: Payer: Self-pay

## 2018-05-27 NOTE — Telephone Encounter (Signed)
Spoke with pt and she would rather have her appt rescheduled. Pt was rescheduled for lab and appt. Pt denies experiencing any complications at this time and was advised to reach out to [place] if anything changes.

## 2018-06-01 ENCOUNTER — Other Ambulatory Visit: Payer: Self-pay

## 2018-06-03 ENCOUNTER — Ambulatory Visit: Payer: Self-pay | Admitting: Cardiovascular Disease

## 2018-08-01 DIAGNOSIS — L570 Actinic keratosis: Secondary | ICD-10-CM | POA: Diagnosis not present

## 2018-08-01 DIAGNOSIS — D2262 Melanocytic nevi of left upper limb, including shoulder: Secondary | ICD-10-CM | POA: Diagnosis not present

## 2018-08-01 DIAGNOSIS — L819 Disorder of pigmentation, unspecified: Secondary | ICD-10-CM | POA: Diagnosis not present

## 2018-08-01 DIAGNOSIS — L814 Other melanin hyperpigmentation: Secondary | ICD-10-CM | POA: Diagnosis not present

## 2018-08-01 DIAGNOSIS — D1801 Hemangioma of skin and subcutaneous tissue: Secondary | ICD-10-CM | POA: Diagnosis not present

## 2018-08-06 ENCOUNTER — Other Ambulatory Visit: Payer: Self-pay | Admitting: Cardiovascular Disease

## 2018-08-12 ENCOUNTER — Other Ambulatory Visit: Payer: Self-pay

## 2018-08-12 ENCOUNTER — Other Ambulatory Visit: Payer: BC Managed Care – PPO | Admitting: *Deleted

## 2018-08-12 DIAGNOSIS — M810 Age-related osteoporosis without current pathological fracture: Secondary | ICD-10-CM | POA: Diagnosis not present

## 2018-08-12 DIAGNOSIS — Z8249 Family history of ischemic heart disease and other diseases of the circulatory system: Secondary | ICD-10-CM | POA: Diagnosis not present

## 2018-08-12 DIAGNOSIS — M8580 Other specified disorders of bone density and structure, unspecified site: Secondary | ICD-10-CM | POA: Insufficient documentation

## 2018-08-12 DIAGNOSIS — Z6821 Body mass index (BMI) 21.0-21.9, adult: Secondary | ICD-10-CM | POA: Diagnosis not present

## 2018-08-12 DIAGNOSIS — E782 Mixed hyperlipidemia: Secondary | ICD-10-CM | POA: Diagnosis not present

## 2018-08-12 DIAGNOSIS — Z01419 Encounter for gynecological examination (general) (routine) without abnormal findings: Secondary | ICD-10-CM | POA: Diagnosis not present

## 2018-08-12 DIAGNOSIS — Z1231 Encounter for screening mammogram for malignant neoplasm of breast: Secondary | ICD-10-CM | POA: Diagnosis not present

## 2018-08-12 LAB — HEPATIC FUNCTION PANEL
ALT: 10 IU/L (ref 0–32)
AST: 16 IU/L (ref 0–40)
Albumin: 4.7 g/dL (ref 3.8–4.8)
Alkaline Phosphatase: 60 IU/L (ref 39–117)
Bilirubin Total: 0.5 mg/dL (ref 0.0–1.2)
Bilirubin, Direct: 0.13 mg/dL (ref 0.00–0.40)
Total Protein: 6.5 g/dL (ref 6.0–8.5)

## 2018-08-12 LAB — LIPID PANEL
Chol/HDL Ratio: 2.9 ratio (ref 0.0–4.4)
Cholesterol, Total: 180 mg/dL (ref 100–199)
HDL: 62 mg/dL (ref >39)
LDL Calculated: 99 mg/dL (ref 0–99)
Triglycerides: 96 mg/dL (ref 0–149)
VLDL Cholesterol Cal: 19 mg/dL (ref 5–40)

## 2018-08-12 LAB — BASIC METABOLIC PANEL
BUN/Creatinine Ratio: 20 (ref 12–28)
BUN: 18 mg/dL (ref 8–27)
CO2: 25 mmol/L (ref 20–29)
Calcium: 9.4 mg/dL (ref 8.7–10.3)
Chloride: 100 mmol/L (ref 96–106)
Creatinine, Ser: 0.91 mg/dL (ref 0.57–1.00)
GFR calc Af Amer: 79 mL/min/{1.73_m2} (ref >59)
GFR calc non Af Amer: 68 mL/min/{1.73_m2} (ref >59)
Glucose: 82 mg/dL (ref 65–99)
Potassium: 4.2 mmol/L (ref 3.5–5.2)
Sodium: 139 mmol/L (ref 134–144)

## 2018-09-02 ENCOUNTER — Ambulatory Visit: Payer: Self-pay | Admitting: Cardiovascular Disease

## 2018-09-02 DIAGNOSIS — Z86010 Personal history of colonic polyps: Secondary | ICD-10-CM | POA: Diagnosis not present

## 2018-09-09 DIAGNOSIS — M25562 Pain in left knee: Secondary | ICD-10-CM | POA: Diagnosis not present

## 2018-09-09 DIAGNOSIS — M25561 Pain in right knee: Secondary | ICD-10-CM | POA: Diagnosis not present

## 2018-09-18 NOTE — Progress Notes (Signed)
error 

## 2018-09-19 ENCOUNTER — Ambulatory Visit (INDEPENDENT_AMBULATORY_CARE_PROVIDER_SITE_OTHER): Payer: BC Managed Care – PPO | Admitting: Cardiovascular Disease

## 2018-09-19 DIAGNOSIS — E782 Mixed hyperlipidemia: Secondary | ICD-10-CM

## 2018-10-10 DIAGNOSIS — H524 Presbyopia: Secondary | ICD-10-CM | POA: Diagnosis not present

## 2018-10-12 DIAGNOSIS — E785 Hyperlipidemia, unspecified: Secondary | ICD-10-CM | POA: Diagnosis not present

## 2018-10-12 DIAGNOSIS — Z0000 Encounter for general adult medical examination without abnormal findings: Secondary | ICD-10-CM | POA: Diagnosis not present

## 2018-10-12 DIAGNOSIS — Z1331 Encounter for screening for depression: Secondary | ICD-10-CM | POA: Diagnosis not present

## 2018-10-12 DIAGNOSIS — E559 Vitamin D deficiency, unspecified: Secondary | ICD-10-CM | POA: Diagnosis not present

## 2018-10-12 DIAGNOSIS — M810 Age-related osteoporosis without current pathological fracture: Secondary | ICD-10-CM | POA: Diagnosis not present

## 2018-10-13 NOTE — Progress Notes (Signed)
Cardiology Office Note   Date:  10/13/2018   ID:  [name], DOB Nov 25, 1955, MRN [id number]  PCP:  [name], MD  Cardiologist:   Mertie Moores, MD   No chief complaint on file.  Problem List: 1. Hypercholesterolemia 2. Family Hx of CAD  He's been quite active. She's back playing tennis and is doing well. She denies any chest pain or shortness breath. She works out on a regular basis and is able to achieve a peak heart rate 185 160 without too much difficulty. She works out for 30 minutes at a time doing these exercises. She's also walking in the evenings with her husband.   March 23, 2013:  [name] is doing OK. She has stopped her Atrovastatin. - she ran out and could not get back into see me.  Her LDL was 181 this last time. Her previous LDL was 108. She admits that her diet has not been as good and she has not been working out as much.   March 23, 2014:  [name] is a 63 y.o. female who presents for follow up of her hyperlipidemia.    She's done very well. She's exercising readily. She's lost a little bit of weight. She's not having any episodes of chest pain.  April 05, 2015:  [name] is seen today for follow up of her hyperlipdiemia. Staying active - golf and tennis regularly .  Walks regularly BP is up slighlty .  March 05, 2016:  Presents with stabbing like chest pain.  Quick jabs. At rest . Doesn't increase with activity. Has not been exercising as much.   Walking does not increase pain .   Not related to eating.  Not related to taking a deep breath.   Has gained some weight ( 10 lbs )  Has not been taking the atorvastatin as regularly  Has been present for 3 weeks .   Occurs almost daily .   May 04, 2017:  Doing well Working out regularly .  Feeling well  No chest pains or shortness of breath. Her last lipid levels were overall okay but her LDL is still 101.  I would like for her LDL to be around 17 since she has a family here  history of early coronary disease in her mother. She admits that she does not take her atorvastatin every day and thinks she misses doses  Most  weekends.  Oct. 9, 2020 [name] is seen today for follow of her CP and hyperlipidemia. Has been busy with doctors visit s Playing golf and tennis .  No cp.  Hikes occasionally  Tolerating the crestor well    Past Medical History:  Diagnosis Date  . Abdominal distention   . Burping    excessive burping   . Diarrhea   . GERD (gastroesophageal reflux disease)   . Hyperlipidemia   . 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 BLADDER SURGERY    . CHOLECYSTECTOMY  2012   laparoscopic  . EUS  02/11/2011   Procedure: UPPER ENDOSCOPIC ULTRASOUND (EUS) LINEAR;  Surgeon: Landry Dyke, MD;  Location: [facility];  Service: Endoscopy;  Laterality: N/A;  mac  . US ECHOCARDIOGRAPHY  11/03/2006   EF 55-60%     Current Outpatient Medications  Medication Sig Dispense Refill  . alendronate (FOSAMAX) 70 MG tablet Take 70 mg by mouth once a week. Take with a full glass  of water on an empty stomach.    . Cholecalciferol (VITAMIN D) 2000 UNITS tablet Take 2,000 Units by mouth daily.    Marland Kitchen ibuprofen (ADVIL,MOTRIN) 200 MG tablet Take 200 mg by mouth every 6 (six) hours as needed for moderate pain.    . rosuvastatin (CRESTOR) 10 MG tablet Take 1 tablet (10 mg total) by mouth daily. Please keep upcoming appt for future refills. Thank you 90 tablet 0   No current facility-administered medications for this visit.     Allergies:   Influenza vaccines    Social History:  The patient  reports that she has never smoked. She has never used smokeless tobacco. She reports that she does not drink alcohol or use drugs.   Family History:  The patient's family history includes Cancer in her paternal grandmother; Heart disease in her mother; Hypertension in her father and mother.    ROS:  Noted in current history, otherwise negative.    Physical Exam: There were no vitals taken for this visit.  GEN:  Well nourished, well developed in no acute distress HEENT: Normal NECK: No JVD; No carotid bruits LYMPHATICS: No lymphadenopathy CARDIAC: RRR   RESPIRATORY:  Clear to auscultation without rales, wheezing or rhonchi  ABDOMEN: Soft, non-tender, non-distended MUSCULOSKELETAL:  No edema; No deformity  SKIN: Warm and dry NEUROLOGIC:  Alert and oriented x 3    EKG:   October 14, 2018: Sinus bradycardia at 55 beats a minute.  Possible left atrial enlargement.  Recent Labs: 08/12/2018: ALT 10; BUN 18; Creatinine, Ser 0.91; Potassium 4.2; Sodium 139    Lipid Panel    Component Value Date/Time   CHOL 180 08/12/2018 1054   TRIG 96 08/12/2018 1054   HDL 62 08/12/2018 1054   CHOLHDL 2.9 08/12/2018 1054   CHOLHDL 2.7 04/04/2015 0905   VLDL 14 04/04/2015 0905   LDLCALC 99 08/12/2018 1054   LDLDIRECT 147.6 01/27/2011 0902      Wt Readings from Last 3 Encounters:  05/04/17 138 lb 1.9 oz (62.7 kg)  12/01/16 145 lb (65.8 kg)  03/05/16 146 lb (66.2 kg)      Other studies Reviewed: Additional studies/ records that were reviewed today include: . Review of the above records demonstrates:    ASSESSMENT AND PLAN:  1. Chest pain:  No further episodes of CP   2.  Hyperlipidemia: her LDL is 99.  Has a strong family hx of CAD ( mother with CAD at young age)  Will increse crestor to 20 mg a day ,  Check lipids, liver, BMP in 3 months   Will see her in a year     Current medicines are reviewed at length with the patient today.  The patient does not have concerns regarding medicines.  The following changes have been made:  no change  Labs/ tests ordered today include:   No orders of the defined types were placed in this encounter.    Disposition:   FU with me in 1 year .     Signed, Mertie Moores, MD  10/13/2018 8:31 PM    [address]  [place], [place],   [address] Phone: [phone]; Fax: [phone]

## 2018-10-14 ENCOUNTER — Other Ambulatory Visit: Payer: Self-pay

## 2018-10-14 ENCOUNTER — Encounter: Payer: Self-pay | Admitting: Cardiovascular Disease

## 2018-10-14 ENCOUNTER — Ambulatory Visit: Payer: BC Managed Care – PPO | Admitting: Cardiovascular Disease

## 2018-10-14 VITALS — BP 126/74 | HR 55 | Ht 66.0 in | Wt 136.0 lb

## 2018-10-14 DIAGNOSIS — Z8249 Family history of ischemic heart disease and other diseases of the circulatory system: Secondary | ICD-10-CM

## 2018-10-14 DIAGNOSIS — E782 Mixed hyperlipidemia: Secondary | ICD-10-CM

## 2018-10-14 MED ORDER — ROSUVASTATIN CALCIUM 10 MG PO TABS: 20.0000 mg | ORAL_TABLET | Freq: Every day | ORAL | 0 refills | Status: DC

## 2018-10-14 NOTE — Addendum Note (Signed)
Addended by: Emmaline Life on: 10/14/2018 11:10 AM   Modules accepted: Orders

## 2018-10-14 NOTE — Patient Instructions (Addendum)
Medication Instructions:  Your physician has recommended you make the following change in your medication:  INCREASE Crestor (Rosuvastatin) to 20 mg once daily Call back for a new prescription  If you need a refill on your cardiac medications before your next appointment, please call your pharmacy.   Lab work: Your physician recommends that you return for lab work in: 3 months on Friday January 15 You may come in anytime after 7:30 am You will need to FAST for this appointment - nothing to eat or drink after midnight the night before except water.   If you have labs (blood work) drawn today and your tests are completely normal, you will receive your results only by: Marland Kitchen MyChart Message (if you have MyChart) OR . A paper copy in the mail If you have any lab test that is abnormal or we need to change your treatment, we will call you to review the results.  Testing/Procedures: None Ordered   Follow-Up: At [facility], you and your health needs are our priority.  As part of our continuing mission to provide you with exceptional heart care, we have created designated Provider Care Teams.  These Care Teams include your primary Cardiologist (physician) and Advanced Practice Providers (APPs -  Physician Assistants and Nurse Practitioners) who all work together to provide you with the care you need, when you need it. You will need a follow up appointment in:  1 years.  Please call our office 2 months in advance to schedule this appointment.  You may see Mertie Moores, MD or one of the following Advanced Practice Providers on your designated Care Team: Richardson Dopp, PA-C [place], [place] . Daune Perch, NP

## 2018-12-23 ENCOUNTER — Other Ambulatory Visit: Payer: Self-pay | Admitting: *Deleted

## 2018-12-23 MED ORDER — ROSUVASTATIN CALCIUM 10 MG PO TABS: 20.0000 mg | ORAL_TABLET | Freq: Every day | ORAL | 3 refills | Status: DC

## 2018-12-27 ENCOUNTER — Telehealth: Payer: Self-pay | Admitting: Cardiovascular Disease

## 2018-12-27 MED ORDER — ROSUVASTATIN CALCIUM 20 MG PO TABS: 20.0000 mg | ORAL_TABLET | Freq: Every day | ORAL | 3 refills | Status: DC

## 2018-12-27 NOTE — Telephone Encounter (Signed)
Patient calling stating she received a message from her Pharmacy stating they did not receive approval for her refill of rosuvastatin (CRESTOR) 10 MG tablet

## 2018-12-27 NOTE — Telephone Encounter (Signed)
Left detailed message for patient that Rx for rosuvastatin 20 mg has been sent to her pharmacy. Advised her to call back with questions or concerns.

## 2019-01-19 ENCOUNTER — Telehealth: Payer: Self-pay | Admitting: Cardiovascular Disease

## 2019-01-19 NOTE — Telephone Encounter (Signed)
Patient is calling wanting to know why she has a lipid panel scheduled for tomorrow 01/20/19 when she isn't due to see Dr. Acie Fredrickson until October. Please advise.

## 2019-01-19 NOTE — Telephone Encounter (Signed)
Spoke with patient and advised her of need for repeat lab work to evaluate her cholesterol and liver function since increasing rosuvastatin to 20 mg in October. She verbalized understanding and agreement and thanked me for calling her.

## 2019-01-20 ENCOUNTER — Other Ambulatory Visit: Payer: Self-pay

## 2019-01-20 ENCOUNTER — Other Ambulatory Visit: Payer: BC Managed Care – PPO | Admitting: *Deleted

## 2019-01-20 DIAGNOSIS — Z8249 Family history of ischemic heart disease and other diseases of the circulatory system: Secondary | ICD-10-CM

## 2019-01-20 DIAGNOSIS — E782 Mixed hyperlipidemia: Secondary | ICD-10-CM

## 2019-01-20 LAB — HEPATIC FUNCTION PANEL
ALT: 13 IU/L (ref 0–32)
AST: 21 IU/L (ref 0–40)
Albumin: 4.8 g/dL (ref 3.8–4.8)
Alkaline Phosphatase: 66 IU/L (ref 39–117)
Bilirubin Total: 0.5 mg/dL (ref 0.0–1.2)
Bilirubin, Direct: 0.16 mg/dL (ref 0.00–0.40)
Total Protein: 7 g/dL (ref 6.0–8.5)

## 2019-01-20 LAB — LIPID PANEL
Chol/HDL Ratio: 2.4 ratio (ref 0.0–4.4)
Cholesterol, Total: 150 mg/dL (ref 100–199)
HDL: 62 mg/dL (ref >39)
LDL Chol Calc (NIH): 74 mg/dL (ref 0–99)
Triglycerides: 74 mg/dL (ref 0–149)
VLDL Cholesterol Cal: 14 mg/dL (ref 5–40)

## 2019-02-08 DIAGNOSIS — L570 Actinic keratosis: Secondary | ICD-10-CM | POA: Diagnosis not present

## 2019-07-16 DIAGNOSIS — H1033 Unspecified acute conjunctivitis, bilateral: Secondary | ICD-10-CM | POA: Diagnosis not present

## 2019-07-31 DIAGNOSIS — H1030 Unspecified acute conjunctivitis, unspecified eye: Secondary | ICD-10-CM | POA: Diagnosis not present

## 2019-07-31 DIAGNOSIS — H6981 Other specified disorders of Eustachian tube, right ear: Secondary | ICD-10-CM | POA: Diagnosis not present

## 2019-07-31 DIAGNOSIS — J019 Acute sinusitis, unspecified: Secondary | ICD-10-CM | POA: Diagnosis not present

## 2019-08-04 DIAGNOSIS — H10011 Acute follicular conjunctivitis, right eye: Secondary | ICD-10-CM | POA: Diagnosis not present

## 2019-08-13 ENCOUNTER — Emergency Department (HOSPITAL_COMMUNITY)
Admission: EM | Admit: 2019-08-13 | Discharge: 2019-08-13 | Disposition: A | Discharge status: Home / Self Care | Payer: BC Managed Care – PPO | Source: Home / Self Care

## 2019-08-13 ENCOUNTER — Inpatient Hospital Stay (HOSPITAL_BASED_OUTPATIENT_CLINIC_OR_DEPARTMENT_OTHER)
Admission: EM | Admit: 2019-08-13 | Discharge: 2019-08-15 | DRG: 866 | Disposition: A | Discharge status: Home / Self Care | Payer: BC Managed Care – PPO | Attending: Cardiovascular Disease | Admitting: Cardiovascular Disease

## 2019-08-13 ENCOUNTER — Emergency Department (HOSPITAL_BASED_OUTPATIENT_CLINIC_OR_DEPARTMENT_OTHER): Payer: BC Managed Care – PPO

## 2019-08-13 ENCOUNTER — Other Ambulatory Visit: Payer: Self-pay

## 2019-08-13 ENCOUNTER — Observation Stay (HOSPITAL_BASED_OUTPATIENT_CLINIC_OR_DEPARTMENT_OTHER): Payer: BC Managed Care – PPO

## 2019-08-13 ENCOUNTER — Encounter (HOSPITAL_COMMUNITY): Payer: Self-pay | Admitting: *Deleted

## 2019-08-13 ENCOUNTER — Encounter (HOSPITAL_BASED_OUTPATIENT_CLINIC_OR_DEPARTMENT_OTHER): Payer: Self-pay | Admitting: Emergency Medicine

## 2019-08-13 ENCOUNTER — Emergency Department (HOSPITAL_COMMUNITY): Payer: BC Managed Care – PPO

## 2019-08-13 DIAGNOSIS — B349 Viral infection, unspecified: Principal | ICD-10-CM | POA: Diagnosis present

## 2019-08-13 DIAGNOSIS — R0600 Dyspnea, unspecified: Secondary | ICD-10-CM | POA: Diagnosis not present

## 2019-08-13 DIAGNOSIS — I2510 Atherosclerotic heart disease of native coronary artery without angina pectoris: Secondary | ICD-10-CM | POA: Diagnosis not present

## 2019-08-13 DIAGNOSIS — R0609 Other forms of dyspnea: Secondary | ICD-10-CM | POA: Diagnosis present

## 2019-08-13 DIAGNOSIS — K219 Gastro-esophageal reflux disease without esophagitis: Secondary | ICD-10-CM | POA: Diagnosis present

## 2019-08-13 DIAGNOSIS — E785 Hyperlipidemia, unspecified: Secondary | ICD-10-CM

## 2019-08-13 DIAGNOSIS — R0602 Shortness of breath: Secondary | ICD-10-CM | POA: Diagnosis not present

## 2019-08-13 DIAGNOSIS — Z887 Allergy status to serum and vaccine status: Secondary | ICD-10-CM

## 2019-08-13 DIAGNOSIS — Z20822 Contact with and (suspected) exposure to covid-19: Secondary | ICD-10-CM | POA: Diagnosis not present

## 2019-08-13 DIAGNOSIS — J45909 Unspecified asthma, uncomplicated: Secondary | ICD-10-CM | POA: Diagnosis not present

## 2019-08-13 DIAGNOSIS — Z7983 Long term (current) use of bisphosphonates: Secondary | ICD-10-CM

## 2019-08-13 DIAGNOSIS — Z79899 Other long term (current) drug therapy: Secondary | ICD-10-CM

## 2019-08-13 DIAGNOSIS — Z8249 Family history of ischemic heart disease and other diseases of the circulatory system: Secondary | ICD-10-CM | POA: Diagnosis not present

## 2019-08-13 DIAGNOSIS — Z5321 Procedure and treatment not carried out due to patient leaving prior to being seen by health care provider: Secondary | ICD-10-CM | POA: Insufficient documentation

## 2019-08-13 LAB — BASIC METABOLIC PANEL
Anion gap: 11 (ref 5–15)
BUN: 17 mg/dL (ref 8–23)
CO2: 21 mmol/L — ABNORMAL LOW (ref 22–32)
Calcium: 9.7 mg/dL (ref 8.9–10.3)
Chloride: 106 mmol/L (ref 98–111)
Creatinine, Ser: 0.78 mg/dL (ref 0.44–1.00)
GFR calc Af Amer: >60 mL/min (ref >60)
GFR calc non Af Amer: >60 mL/min (ref >60)
Glucose, Bld: 106 mg/dL — ABNORMAL HIGH (ref 70–99)
Potassium: 3.6 mmol/L (ref 3.5–5.1)
Sodium: 138 mmol/L (ref 135–145)

## 2019-08-13 LAB — TROPONIN I (HIGH SENSITIVITY)
Troponin I (High Sensitivity): 4 ng/L (ref <18)
Troponin I (High Sensitivity): 5 ng/L (ref <18)
Troponin I (High Sensitivity): 5 ng/L (ref <18)
Troponin I (High Sensitivity): 3 ng/L (ref <18)

## 2019-08-13 LAB — ECHOCARDIOGRAM COMPLETE
AR max vel: 2.14 cm2
AV Area VTI: 1.94 cm2
AV Area mean vel: 1.69 cm2
AV Mean grad: 5 mmHg
AV Peak grad: 8.4 mmHg
Ao pk vel: 1.45 m/s
Area-P 1/2: 2.91 cm2
S' Lateral: 2.8 cm

## 2019-08-13 LAB — CREATININE, SERUM
Creatinine, Ser: 1.22 mg/dL — ABNORMAL HIGH (ref 0.44–1.00)
GFR calc Af Amer: 55 mL/min — ABNORMAL LOW (ref >60)
GFR calc non Af Amer: 47 mL/min — ABNORMAL LOW (ref >60)

## 2019-08-13 LAB — CBC
HCT: 42.5 % (ref 36.0–46.0)
HCT: 40.8 % (ref 36.0–46.0)
Hemoglobin: 14.4 g/dL (ref 12.0–15.0)
Hemoglobin: 13.5 g/dL (ref 12.0–15.0)
MCH: 29.4 pg (ref 26.0–34.0)
MCH: 29.1 pg (ref 26.0–34.0)
MCHC: 33.9 g/dL (ref 30.0–36.0)
MCHC: 33.1 g/dL (ref 30.0–36.0)
MCV: 86.7 fL (ref 80.0–100.0)
MCV: 87.9 fL (ref 80.0–100.0)
Platelets: 354 10*3/uL (ref 150–400)
Platelets: 329 10*3/uL (ref 150–400)
RBC: 4.9 MIL/uL (ref 3.87–5.11)
RBC: 4.64 MIL/uL (ref 3.87–5.11)
RDW: 11.7 % (ref 11.5–15.5)
RDW: 11.9 % (ref 11.5–15.5)
WBC: 4.9 10*3/uL (ref 4.0–10.5)
WBC: 4.6 10*3/uL (ref 4.0–10.5)
nRBC: 0 % (ref 0.0–0.2)
nRBC: 0 % (ref 0.0–0.2)

## 2019-08-13 LAB — PROTIME-INR
INR: 1 (ref 0.8–1.2)
Prothrombin Time: 13.2 seconds (ref 11.4–15.2)

## 2019-08-13 LAB — C-REACTIVE PROTEIN: CRP: <0.5 mg/dL (ref <1.0)

## 2019-08-13 LAB — MRSA PCR SCREENING: MRSA by PCR: NEGATIVE

## 2019-08-13 LAB — SEDIMENTATION RATE: Sed Rate: 5 mm/hr (ref 0–22)

## 2019-08-13 LAB — SARS CORONAVIRUS 2 BY RT PCR (HOSPITAL ORDER, PERFORMED IN ~~LOC~~ HOSPITAL LAB): SARS Coronavirus 2: NEGATIVE

## 2019-08-13 MED ORDER — ROSUVASTATIN CALCIUM 20 MG PO TABS
20.0000 mg | ORAL_TABLET | Freq: Every day | ORAL | Status: DC
  Administered 2019-08-13 – 2019-08-14 (×2): 20 mg via ORAL
  Filled 2019-08-13 (×3): qty 1

## 2019-08-13 MED ORDER — NITROGLYCERIN 0.4 MG SL SUBL: 0.4000 mg | SUBLINGUAL_TABLET | SUBLINGUAL | Status: DC | PRN

## 2019-08-13 MED ORDER — ALENDRONATE SODIUM 70 MG PO TABS
70.0000 mg | ORAL_TABLET | ORAL | Status: DC
  Administered 2019-08-14: 70 mg via ORAL
  Filled 2019-08-13: qty 1

## 2019-08-13 MED ORDER — ACETAMINOPHEN 325 MG PO TABS
650.0000 mg | ORAL_TABLET | ORAL | Status: DC | PRN
  Administered 2019-08-14 – 2019-08-15 (×4): 650 mg via ORAL
  Filled 2019-08-13 (×4): qty 2

## 2019-08-13 MED ORDER — IOHEXOL 350 MG/ML SOLN
100.0000 mL | Freq: Once | INTRAVENOUS | Status: AC | PRN
  Administered 2019-08-13: 69 mL via INTRAVENOUS

## 2019-08-13 MED ORDER — VITAMIN D 25 MCG (1000 UNIT) PO TABS
2000.0000 [IU] | ORAL_TABLET | Freq: Every day | ORAL | Status: DC
  Administered 2019-08-14: 2000 [IU] via ORAL
  Filled 2019-08-13: qty 2

## 2019-08-13 MED ORDER — SODIUM CHLORIDE 0.9 % IV SOLN: INTRAVENOUS | Status: AC

## 2019-08-13 MED ORDER — SODIUM CHLORIDE 0.9% FLUSH: 3.0000 mL | Freq: Once | INTRAVENOUS | Status: DC

## 2019-08-13 MED ORDER — ENOXAPARIN SODIUM 40 MG/0.4ML ~~LOC~~ SOLN
40.0000 mg | SUBCUTANEOUS | Status: DC
  Administered 2019-08-13 – 2019-08-14 (×2): 40 mg via SUBCUTANEOUS
  Filled 2019-08-13 (×2): qty 0.4

## 2019-08-13 MED ORDER — METOPROLOL TARTRATE 50 MG PO TABS
50.0000 mg | ORAL_TABLET | Freq: Once | ORAL | Status: AC | PRN
  Administered 2019-08-14: 100 mg via ORAL
  Filled 2019-08-13: qty 2

## 2019-08-13 MED ORDER — ONDANSETRON HCL 4 MG/2ML IJ SOLN: 4.0000 mg | Freq: Four times a day (QID) | INTRAMUSCULAR | Status: DC | PRN

## 2019-08-13 MED ORDER — VITAMIN D 25 MCG (1000 UNIT) PO TABS
2000.0000 [IU] | ORAL_TABLET | Freq: Every day | ORAL | Status: DC
  Administered 2019-08-13: 2000 [IU] via ORAL
  Filled 2019-08-13 (×2): qty 2

## 2019-08-13 NOTE — Plan of Care (Signed)
Initiation of Care Plan  Problem: Education: Goal: Knowledge of General Education information will improve Description: Including pain rating scale, medication(s)/side effects and non-pharmacologic comfort measures Outcome: Progressing   Problem: Education: Goal: Knowledge of General Education information will improve Description: Including pain rating scale, medication(s)/side effects and non-pharmacologic comfort measures Outcome: Progressing   Problem: Health Behavior/Discharge Planning: Goal: Ability to manage health-related needs will improve Outcome: Progressing   Problem: Clinical Measurements: Goal: Ability to maintain clinical measurements within normal limits will improve Outcome: Progressing Goal: Will remain free from infection Outcome: Progressing Goal: Diagnostic test results will improve Outcome: Progressing Goal: Respiratory complications will improve Outcome: Progressing Goal: Cardiovascular complication will be avoided Outcome: Progressing   Problem: Activity: Goal: Risk for activity intolerance will decrease Outcome: Progressing   Problem: Nutrition: Goal: Adequate nutrition will be maintained Outcome: Progressing   Problem: Coping: Goal: Level of anxiety will decrease Outcome: Progressing   Problem: Elimination: Goal: Will not experience complications related to bowel motility Outcome: Progressing Goal: Will not experience complications related to urinary retention Outcome: Progressing   Problem: Pain Managment: Goal: General experience of comfort will improve Outcome: Progressing   Problem: Safety: Goal: Ability to remain free from injury will improve Outcome: Progressing   Problem: Skin Integrity: Goal: Risk for impaired skin integrity will decrease Outcome: Progressing   Problem: Education: Goal: Ability to demonstrate management of disease process will improve Outcome: Progressing Goal: Ability to verbalize understanding of  medication therapies will improve Outcome: Progressing Goal: Individualized Educational Video(s) Outcome: Progressing   Problem: Activity: Goal: Capacity to carry out activities will improve Outcome: Progressing   Problem: Cardiac: Goal: Ability to achieve and maintain adequate cardiopulmonary perfusion will improve Outcome: Progressing

## 2019-08-13 NOTE — ED Triage Notes (Signed)
Upper resp symptoms x 2 weeks, SOB with exertion this past week, worse x 2 days. COVID vaccine in Jan.

## 2019-08-13 NOTE — ED Triage Notes (Signed)
Pt is here with sob for 2 weeks that increases with exertion.  She has been on an anbx and no change in symptoms.  Pt states her HR 70-150. Patient had a COVID test 5 days ago that was negative.

## 2019-08-13 NOTE — ED Provider Notes (Signed)
[facility] EMERGENCY DEPARTMENT Provider Note   CSN: [id number] Arrival date & time: 08/13/19  0845     History Chief Complaint  Patient presents with  . Shortness of Breath    [name] is a 64 y.o. female.  She is complaining of intermittent shortness of breath it has been going on for a few weeks.  It all started with some nasal congestion and postnasal drip.  She went through a course of antibiotics and some nasal spray.  She has been getting progressively short of breath especially with exertion over the past few days.  Had a negative Covid test last week and has had her vaccines.  No fevers or chills no cough no chest pain abdominal pain vomiting.  Some loose stool.  No sick contacts.  Recently traveled to [place].  The history is provided by the patient.  Shortness of Breath Severity:  Moderate Onset quality:  Gradual Duration:  1 week Timing:  Intermittent Progression:  Worsening Chronicity:  New Context: activity   Relieved by:  Rest Worsened by:  Activity Ineffective treatments:  None tried Associated symptoms: no abdominal pain, no chest pain, no cough, no fever, no headaches, no hemoptysis, no rash, no sputum production and no vomiting   Risk factors: no hx of PE/DVT and no tobacco use        Past Medical History:  Diagnosis Date  . Abdominal distention   . Burping    excessive burping   . Diarrhea   . GERD (gastroesophageal reflux disease)   . Hyperlipidemia   . 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 Other chest pain 03/05/2016  . Hyperlipidemia 01/29/2011  . Nonspecific elevation of levels of transaminase or lactic acid dehydrogenase (LDH) 11/12/2010  . Esophageal reflux 11/12/2010    Past Surgical History:  Procedure Laterality Date  . BLADDER SURGERY    . CHOLECYSTECTOMY  2012   laparoscopic  . EUS  02/11/2011   Procedure: UPPER ENDOSCOPIC ULTRASOUND  (EUS) LINEAR;  Surgeon: Landry Dyke, MD;  Location: [facility];  Service: Endoscopy;  Laterality: N/A;  mac  . US ECHOCARDIOGRAPHY  11/03/2006   EF 55-60%     OB History   No obstetric history on file.     Family History  Problem Relation Age of Onset  . Heart disease Mother   . Hypertension Mother   . Hypertension Father   . Cancer Paternal Grandmother        Gallbladder  . Colon cancer Neg Hx     Social History   Tobacco Use  . Smoking status: Never Smoker  . Smokeless tobacco: Never Used  Vaping Use  . Vaping Use: Never used  Substance Use Topics  . Alcohol use: No  . Drug use: No    Home Medications Prior to Admission medications   Medication Sig Start Date End Date Taking? Authorizing Provider  alendronate (FOSAMAX) 70 MG tablet Take 70 mg by mouth once a week. Take with a full glass of water on an empty stomach.    [provider]  Cholecalciferol (VITAMIN D) 2000 UNITS tablet Take 2,000 Units by mouth daily.    [provider]  ibuprofen (ADVIL,MOTRIN) 200 MG tablet Take 200 mg by mouth every 6 (six) hours as needed for moderate pain.    [provider]  rosuvastatin (CRESTOR) 20 MG tablet Take 1 tablet (20 mg  total) by mouth daily. 12/27/18   Nahser, Wonda Cheng, MD    Allergies    Influenza vaccines  Review of Systems   Review of Systems  Constitutional: Negative for fever.  HENT: Positive for postnasal drip.   Eyes: Negative for visual disturbance.  Respiratory: Positive for shortness of breath. Negative for cough, hemoptysis and sputum production.   Cardiovascular: Negative for chest pain.  Gastrointestinal: Positive for diarrhea. Negative for abdominal pain and vomiting.  Genitourinary: Negative for dysuria.  Musculoskeletal: Negative for joint swelling.  Skin: Negative for rash.  Neurological: Negative for headaches.    Physical Exam Updated Vital Signs BP 127/86 (BP Location: Left Arm)   Pulse 83   Temp (!)  97.5 F (36.4 C)   Resp 20   SpO2 100%   Physical Exam Vitals and nursing note reviewed.  Constitutional:      General: She is not in acute distress.    Appearance: She is well-developed.  HENT:     Head: Normocephalic and atraumatic.  Eyes:     Conjunctiva/sclera: Conjunctivae normal.  Cardiovascular:     Rate and Rhythm: Normal rate and regular rhythm.     Heart sounds: No murmur heard.   Pulmonary:     Effort: Pulmonary effort is normal. No respiratory distress.     Breath sounds: Normal breath sounds.  Abdominal:     Palpations: Abdomen is soft.     Tenderness: There is no abdominal tenderness.  Musculoskeletal:        General: Normal range of motion.     Cervical back: Neck supple.     Right lower leg: No tenderness. No edema.     Left lower leg: No tenderness. No edema.  Skin:    General: Skin is warm and dry.     Capillary Refill: Capillary refill takes less than 2 seconds.  Neurological:     General: No focal deficit present.     Mental Status: She is alert.     ED Results / Procedures / Treatments   Labs (all labs ordered are listed, but only abnormal results are displayed) Labs Reviewed  SARS CORONAVIRUS 2 BY RT PCR ([facility])  MRSA PCR SCREENING  C-REACTIVE PROTEIN  PROTIME-INR  SEDIMENTATION RATE  CBC  CREATININE, SERUM  BASIC METABOLIC PANEL  TROPONIN I (HIGH SENSITIVITY)  TROPONIN I (HIGH SENSITIVITY)    EKG EKG Interpretation  Date/Time:  Sunday August 13 2019 08:54:23 EDT Ventricular Rate:  83 PR Interval:    QRS Duration: 89 QT Interval:  402 QTC Calculation: 473 R Axis:   73 Text Interpretation: Sinus rhythm Consider right atrial enlargement Abnormal R-wave progression, early transition Minimal ST depression, diffuse leads No significant change since prior today Confirmed by Aletta Edouard [phone]) on 08/13/2019 9:05:23 AM   Radiology DG Chest 2 View  Result Date: 08/13/2019 CLINICAL  DATA:  Shortness of breath EXAM: CHEST - 2 VIEW COMPARISON:  December 01, 2017 FINDINGS: The heart size and mediastinal contours are within normal limits. Both lungs are clear. The visualized skeletal structures are unremarkable. IMPRESSION: No active cardiopulmonary disease. Electronically Signed   By: Prudencio Pair M.D.   On: 08/13/2019 02:34   CT Angio Chest PE W/Cm &/Or Wo Cm  Result Date: 08/13/2019 CLINICAL DATA:  Chest pain, shortness of breath EXAM: CT ANGIOGRAPHY CHEST WITH CONTRAST TECHNIQUE: Multidetector CT imaging of the chest was performed using the standard protocol during bolus administration of intravenous contrast. Multiplanar CT  image reconstructions and MIPs were obtained to evaluate the vascular anatomy. CONTRAST:  73m OMNIPAQUE IOHEXOL 350 MG/ML SOLN COMPARISON:  Thyroid ultrasound, 12/11/2016, ultrasound-guided FNA, 01/07/2017 FINDINGS: Cardiovascular: Satisfactory opacification of the pulmonary arteries to the segmental level. No evidence of pulmonary embolism. Normal heart size. Left coronary artery calcifications. No pericardial effusion. Mediastinum/Nodes: No enlarged mediastinal, hilar, or axillary lymph nodes. Nodule of the right lobe of the thyroid measuring 1.2 cm, previously assessed by thyroid ultrasound and biopsy. Trachea, and esophagus demonstrate no significant findings. Lungs/Pleura: Lungs are clear. No pleural effusion or pneumothorax. Upper Abdomen: No acute abnormality. Musculoskeletal: No chest wall abnormality. No acute or significant osseous findings. Review of the MIP images confirms the above findings. IMPRESSION: 1.  Negative examination for pulmonary embolism. 2.  Coronary artery disease. 3. Right thyroid nodule, previously biopsied. No imaging follow-up required. (Ref: J Am Coll Radiol. 2015 Feb;12(2): 143-50). Electronically Signed   By: AEddie CandleM.D.   On: 08/13/2019 10:27    Procedures Procedures (including critical care time)  Medications Ordered in  ED Medications  rosuvastatin (CRESTOR) tablet 20 mg (20 mg Oral Given 08/13/19 1504)  0.9 %  sodium chloride infusion ( Intravenous New Bag/Given 08/13/19 1627)  alendronate (FOSAMAX) tablet 70 mg (has no administration in time range)  cholecalciferol (VITAMIN D3) tablet 2,000 Units (has no administration in time range)  nitroGLYCERIN (NITROSTAT) SL tablet 0.4 mg (has no administration in time range)  acetaminophen (TYLENOL) tablet 650 mg (has no administration in time range)  ondansetron (ZOFRAN) injection 4 mg (has no administration in time range)  enoxaparin (LOVENOX) injection 40 mg (has no administration in time range)  metoprolol tartrate (LOPRESSOR) tablet 50-100 mg (has no administration in time range)  iohexol (OMNIPAQUE) 350 MG/ML injection 100 mL (69 mLs Intravenous Contrast Given 08/13/19 0951)    ED Course  I have reviewed the triage vital signs and the nursing notes.  Pertinent labs & imaging results that were available during my care of the patient were reviewed by me and considered in my medical decision making (see chart for details).  Clinical Course as of Aug 13 1715  Sun Aug 13, 2019  1127 [place]'s [place] [facility] [place] [facility] [place] [facility] symptoms.  She remains very symptomatic and try to get a trending pulse ox but she could barely get 3 steps from the bed before getting lightheaded and her heart rate shot up.  Her pulse ox did not pick up well but look like it was dropping into the 80s.  Discussed with cardiology Dr. NAcie Fredricksonwho will admit the patient to his service.  I have put a bed into [facility] telemetry.   [MB]    Clinical Course User Index [MB] BHayden Rasmussen MD   MDM Rules/Calculators/A&P                         Pamela BUELwas evaluated in Emergency Department on 08/13/2019 for the symptoms described in the history of present illness. She was evaluated in the context of the global COVID-19 pandemic, which  necessitated consideration that the patient might be at risk for infection with the SARS-CoV-2 virus that causes COVID-19. Institutional protocols and algorithms that pertain to the evaluation of patients at risk for COVID-19 are in a state of rapid change based on information released by regulatory bodies including the CDC and federal and state organizations. These policies and algorithms were followed  during the patient's care in the ED.  This patient complains of dyspnea on exertion; this involves an extensive number of treatment Options and is a complaint that carries with it a high risk of complications and Morbidity. The differential includes pneumonia, PE, pneumothorax, Covid, anemia, cardiomyopathy  I ordered, reviewed and interpreted labs, which included CBC chemistry and 2 troponins done at [facility] at 1 AM that all were unremarkable.  I repeated a troponin which was still unchanged along with Covid testing is negative.  Sed rate and CRP are unimpressive. I ordered imaging studies which included CT angio and I independently    visualized and interpreted imaging which showed no evidence of PE or other abnormalities to explain her dyspnea Previous records obtained and reviewed in epic including her labs done earlier this morning. I consulted Dr. Dareen Piano cardiology and discussed lab and imaging findings  Critical Interventions: None  After the interventions stated above, I reevaluated the patient and found patient still to be quite symptomatic with any type of exertion.  After discussion with cardiology patient is to be admitted over to [facility] for cardiac echo and further work-up.  She is agreeable to plan.   Final Clinical Impression(s) / ED Diagnoses Final diagnoses:  DOE (dyspnea on exertion)    Rx / DC Orders ED Discharge Orders    None       Hayden Rasmussen, MD 08/13/19 1720

## 2019-08-13 NOTE — ED Notes (Signed)
Pt stated se did not want to wait and left

## 2019-08-13 NOTE — ED Notes (Signed)
Called and talked with [place] about the admit of this patient that is being admitted by her cardiologist Dr. Acie Fredrickson

## 2019-08-13 NOTE — H&P (Signed)
Cardiology Admission History and Physical:   Patient ID: [name] MRN: [id number]; DOB: 03/17/1955   Admission date: 08/13/2019  Primary Care Provider: [name], MD [facility] Cardiologist: Mertie Moores, MD   [facility] Electrophysiologist:  None    Chief Complaint:     Patient Profile:   [name] is a 64 y.o. female with history of hyperlipidemia.  She is admitted to the hospital with profound shortness of breath with exertion.  History of Present Illness:   Ms. Natt is a very healthy 64 year old female.  She has a family history of premature coronary artery disease and has hyperlipidemia herself.  She is extremely active.  She hikes, walks, plays tennis on a regular basis.  For the past 2 weeks she is had progressive shortness of breath primarily with exertion.  She does not have any symptoms at rest.  Over the past week or so the symptoms have progressed and now she gets extremely short of breath walking as little as 6 to 7 feet.  She presented to the [facility] emergency room last night.  Troponin levels were negative.  Chest x-ray was negative.  She did not get to see a doctor because of the long wait times.  She called me this morning and inform me of her situation.  We steered her to the [place] on [address].  In the emergency room she continued to have shortness of breath with exertion.  She had normal saturations at rest but desaturated and became very tachycardic with any sort of ambulation.  CT angiogram of the lungs was negative for pulmonary embolus.  Repeat troponin level was negative.  She is now was admitted to the hospital for further evaluation and management.    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 Authorizing Provider  alendronate (FOSAMAX) 70 MG tablet Take 70 mg by mouth every Monday. Take with a full glass of water on an empty stomach.    Yes [provider]  Cholecalciferol (VITAMIN D3) 50 MCG (2000 UT) TABS Take 2,000 Units by mouth daily.   Yes [provider]  ibuprofen (ADVIL,MOTRIN) 200 MG tablet Take 200-600 mg by mouth every 6 (six) hours as needed for headache, mild pain or moderate pain.    Yes [provider]  rosuvastatin (CRESTOR) 20 MG tablet Take 1 tablet (20 mg total) by mouth daily. 12/27/18  Yes Daegan Arizmendi, Wonda Cheng, MD  cefdinir (OMNICEF) 300 MG capsule Take by mouth. Patient not taking: Reported on 08/13/2019 07/31/19   [provider]  fluticasone (FLONASE) 50 MCG/ACT nasal spray Place into both nostrils. Patient not taking: Reported on 08/13/2019 07/31/19   [provider]  moxifloxacin (VIGAMOX) 0.5 % ophthalmic solution  07/16/19  [provider]  polyethylene glycol-electrolytes (NULYTELY) 420 g solution  08/30/18   [provider]  prednisoLONE acetate (PRED FORTE) 1 % ophthalmic suspension  08/04/19   [provider]     Allergies:    Allergies  Allergen Reactions   Influenza Vaccines Nausea And Vomiting and Other (See Comments)    "Patient reported a severe allergic reaction to the influenza vaccine- several headaches, n/v    Social History:   Social History   Socioeconomic History   Marital status: Married    Spouse name: Not on file   Number of children:  3   Years of education: Not on file   Highest education level: Not on file  Occupational History   Occupation: Homemaker  Tobacco Use   Smoking status: Never Smoker   Smokeless tobacco: Never Used  Scientific laboratory technician Use: Never used  Substance and Sexual Activity   Alcohol use: No   Drug use: No   Sexual activity: Not on file  Other Topics Concern   Not on file  Social History Narrative   0 caffeine drinks    Social Determinants of Health   Financial Resource Strain:    Difficulty of Paying Living Expenses:   Food Insecurity:    Worried About Charity fundraiser in the Last Year:    Arboriculturist in the Last Year:   Transportation Needs:    Film/video editor (Medical):    Lack of Transportation (Non-Medical):   Physical Activity:    Days of Exercise per Week:    Minutes of Exercise per Session:   Stress:    Feeling of Stress :   Social Connections:    Frequency of Communication with Friends and Family:    Frequency of Social Gatherings with Friends and Family:    Attends Religious Services:    Active Member of Clubs or Organizations:    Attends Music therapist:    Marital Status:   Intimate Partner Violence:    Fear of Current or Ex-Partner:    Emotionally Abused:    Physically Abused:    Sexually Abused:     Family History:   The patient's family history includes Cancer in her paternal grandmother; Heart disease in her mother; Hypertension in her father and mother. There is no history of Colon cancer.    ROS:  Please see the history of present illness.  All other ROS reviewed and negative.     Physical Exam/Data:   Vitals:   08/13/19 1400 08/13/19 1402 08/13/19 1448 08/13/19 1617  BP: 123/74 123/74 (!) 147/83   Pulse: 92 86 79 86  Resp: _0 Temp:  97.9 F (36.6 C) 97.6 F (36.4 C) 97.8 F (36.6 C)  TempSrc:  Oral Oral Oral  SpO2:  98% 100% 100%   No intake or output data in the 24 hours  ending 08/13/19 1624 Last 3 Weights 10/14/2018 05/04/2017 12/01/2016  Weight (lbs) 136 lb 138 lb 1.9 oz 145 lb  Weight (kg) 61.689 kg 62.651 kg 65.772 kg     There is no height or weight on file to calculate BMI.  General:  Well nourished, well developed, in no acute distress HEENT: normal Lymph: no adenopathy Neck: no JVD Endocrine:  No thryomegaly Vascular: No carotid bruits; FA pulses 2+ bilaterally without bruits  Cardiac:  normal S1, S2; RRR; no murmur  Lungs:  clear to auscultation bilaterally, no wheezing, rhonchi or rales  Abd: soft, nontender, no hepatomegaly  Ext: no edema Musculoskeletal:  No deformities, BUE and BLE strength normal and equal Skin: warm and dry  Neuro:  CNs 2-12 intact, no focal abnormalities noted Psych:  Normal affect    EKG: Normal sinus rhythm at 83 beats a minute.  She has no ST or T wave changes.  Relevant CV Studies:   Laboratory Data:  High Sensitivity Troponin:   Recent Labs  Lab 08/13/19 0149 08/13/19 0353 08/13/19 0916  TROPONINIHS _0 Chemistry Recent Labs  Lab 08/13/19 0149  NA 138  K 3.6  CL 106  CO2 21*  GLUCOSE 106*  BUN 17  CREATININE 0.78  CALCIUM 9.7  GFRNONAA >60  GFRAA >60  ANIONGAP 11    No results for input(s): PROT, ALBUMIN, AST, ALT, ALKPHOS, BILITOT in the last 168 hours. Hematology Recent Labs  Lab 08/13/19 0149  WBC 4.9  RBC 4.90  HGB 14.4  HCT 42.5  MCV 86.7  MCH 29.4  MCHC 33.9  RDW 11.7  PLT 354   BNPNo results for input(s): BNP, PROBNP in the last 168 hours.  DDimer No results for input(s): DDIMER in the last 168 hours.   Radiology/Studies:  DG Chest 2 View  Result Date: 08/13/2019 CLINICAL DATA:  Shortness of breath EXAM: CHEST - 2 VIEW COMPARISON:  December 01, 2017 FINDINGS: The heart size and mediastinal contours are within normal limits. Both lungs are clear. The visualized skeletal structures are unremarkable. IMPRESSION: No active cardiopulmonary disease. Electronically  Signed   By: Prudencio Pair M.D.   On: 08/13/2019 02:34   CT Angio Chest PE W/Cm &/Or Wo Cm  Result Date: 08/13/2019 CLINICAL DATA:  Chest pain, shortness of breath EXAM: CT ANGIOGRAPHY CHEST WITH CONTRAST TECHNIQUE: Multidetector CT imaging of the chest was performed using the standard protocol during bolus administration of intravenous contrast. Multiplanar CT image reconstructions and MIPs were obtained to evaluate the vascular anatomy. CONTRAST:  25m OMNIPAQUE IOHEXOL 350 MG/ML SOLN COMPARISON:  Thyroid ultrasound, 12/11/2016, ultrasound-guided FNA, 01/07/2017 FINDINGS: Cardiovascular: Satisfactory opacification of the pulmonary arteries to the segmental level. No evidence of pulmonary embolism. Normal heart size. Left coronary artery calcifications. No pericardial effusion. Mediastinum/Nodes: No enlarged mediastinal, hilar, or axillary lymph nodes. Nodule of the right lobe of the thyroid measuring 1.2 cm, previously assessed by thyroid ultrasound and biopsy. Trachea, and esophagus demonstrate no significant findings. Lungs/Pleura: Lungs are clear. No pleural effusion or pneumothorax. Upper Abdomen: No acute abnormality. Musculoskeletal: No chest wall abnormality. No acute or significant osseous findings. Review of the MIP images confirms the above findings. IMPRESSION: 1.  Negative examination for pulmonary embolism. 2.  Coronary artery disease. 3. Right thyroid nodule, previously biopsied. No imaging follow-up required. (Ref: J Am Coll Radiol. 2015 Feb;12(2): 143-50). Electronically Signed   By: AEddie CandleM.D.   On: 08/13/2019 10:27      Assessment and Plan:   1. Shortness of breath: Roxsana presents with severe shortness of breath with exertion.  Her O2 saturations are normal at rest and she feels fine with lying down.  She desaturates and becomes very short of breath with any sort of exertion.  I discussed the case with pulmonary.  They brought up the possibility of an undiagnosed shunt. Her  chest x-ray and CT scan show clear lung parenchyma.  Her cardiac size is normal.  We will be getting an echocardiogram as soon as possible.  She had a normal echocardiogram back in  2008.  She has had 3 - troponin levels.  While this does not appear to be an acute coronary syndrome she does have coronary artery calcifications on her CT scan and does have a family history of premature coronary artery disease.  Her exam and x-ray reports are all normal.  There is no pericardial or pleural rub.  She has no gallops.  There is no leg edema. There is no cyanosis.  2.  Hyperlipidemia: Continue rosuvastatin.  3.  History of premature coronary artery disease: Her mother had her first heart attack in her 15s.  Schuyler was found to have coronary calcifications on CT scan.  We will get a coronary CT angiogram tomorrow.  Severity of Illness: The appropriate patient status for this patient is OBSERVATION. Observation status is judged to be reasonable and necessary in order to provide the required intensity of service to ensure the patient's safety. The patient's presenting symptoms, physical exam findings, and initial radiographic and laboratory data in the context of their medical condition is felt to place them at decreased risk for further clinical deterioration. Furthermore, it is anticipated that the patient will be medically stable for discharge from the hospital within 2 midnights of admission. The following factors support the patient status of observation.   " The patient's presenting symptoms include .severe DOE  " The physical exam findings include . " The initial radiographic and laboratory data are .     For questions or updates, please contact [place] Please consult [email or web address] for contact info under     Signed, Mertie Moores, MD  08/13/2019 4:24 PM

## 2019-08-13 NOTE — Progress Notes (Signed)
  Echocardiogram 2D Echocardiogram has been performed.  [name] 08/13/2019, 5:50 PM

## 2019-08-14 ENCOUNTER — Observation Stay (HOSPITAL_COMMUNITY): Payer: BC Managed Care – PPO

## 2019-08-14 DIAGNOSIS — Z79899 Other long term (current) drug therapy: Secondary | ICD-10-CM | POA: Diagnosis not present

## 2019-08-14 DIAGNOSIS — Z20822 Contact with and (suspected) exposure to covid-19: Secondary | ICD-10-CM | POA: Diagnosis present

## 2019-08-14 DIAGNOSIS — R0600 Dyspnea, unspecified: Secondary | ICD-10-CM

## 2019-08-14 DIAGNOSIS — E785 Hyperlipidemia, unspecified: Secondary | ICD-10-CM | POA: Diagnosis present

## 2019-08-14 DIAGNOSIS — Z7983 Long term (current) use of bisphosphonates: Secondary | ICD-10-CM | POA: Diagnosis not present

## 2019-08-14 DIAGNOSIS — Z887 Allergy status to serum and vaccine status: Secondary | ICD-10-CM | POA: Diagnosis not present

## 2019-08-14 DIAGNOSIS — K219 Gastro-esophageal reflux disease without esophagitis: Secondary | ICD-10-CM | POA: Diagnosis present

## 2019-08-14 DIAGNOSIS — Z8249 Family history of ischemic heart disease and other diseases of the circulatory system: Secondary | ICD-10-CM | POA: Diagnosis not present

## 2019-08-14 DIAGNOSIS — B349 Viral infection, unspecified: Secondary | ICD-10-CM | POA: Diagnosis present

## 2019-08-14 DIAGNOSIS — J45909 Unspecified asthma, uncomplicated: Secondary | ICD-10-CM | POA: Diagnosis present

## 2019-08-14 DIAGNOSIS — I2510 Atherosclerotic heart disease of native coronary artery without angina pectoris: Secondary | ICD-10-CM

## 2019-08-14 LAB — BASIC METABOLIC PANEL
Anion gap: 9 (ref 5–15)
BUN: 20 mg/dL (ref 8–23)
CO2: 19 mmol/L — ABNORMAL LOW (ref 22–32)
Calcium: 8.5 mg/dL — ABNORMAL LOW (ref 8.9–10.3)
Chloride: 110 mmol/L (ref 98–111)
Creatinine, Ser: 0.78 mg/dL (ref 0.44–1.00)
GFR calc Af Amer: >60 mL/min (ref >60)
GFR calc non Af Amer: >60 mL/min (ref >60)
Glucose, Bld: 101 mg/dL — ABNORMAL HIGH (ref 70–99)
Potassium: 4.2 mmol/L (ref 3.5–5.1)
Sodium: 138 mmol/L (ref 135–145)

## 2019-08-14 MED ORDER — NITROGLYCERIN 0.4 MG SL SUBL: 0.8000 mg | SUBLINGUAL_TABLET | Freq: Once | SUBLINGUAL | Status: AC

## 2019-08-14 MED ORDER — SODIUM CHLORIDE 0.9% FLUSH
3.0000 mL | Freq: Two times a day (BID) | INTRAVENOUS | Status: DC
  Administered 2019-08-14 – 2019-08-15 (×2): 3 mL via INTRAVENOUS

## 2019-08-14 MED ORDER — NITROGLYCERIN 0.4 MG SL SUBL
SUBLINGUAL_TABLET | SUBLINGUAL | Status: AC
  Administered 2019-08-14: 0.8 mg via SUBLINGUAL
  Filled 2019-08-14: qty 2

## 2019-08-14 MED ORDER — SODIUM CHLORIDE 0.9% FLUSH: 3.0000 mL | INTRAVENOUS | Status: DC | PRN

## 2019-08-14 MED ORDER — IOHEXOL 350 MG/ML SOLN: 100.0000 mL | Freq: Once | INTRAVENOUS | Status: DC | PRN

## 2019-08-14 MED ORDER — SODIUM CHLORIDE 0.9 % IV SOLN: 250.0000 mL | INTRAVENOUS | Status: DC | PRN

## 2019-08-14 MED ORDER — SODIUM CHLORIDE 0.9 % IV SOLN: INTRAVENOUS | Status: DC

## 2019-08-14 NOTE — Progress Notes (Addendum)
Progress Note  Patient Name: [name] Date of Encounter: 08/14/2019  Primary Cardiologist: Mertie Moores, MD   Subjective   Feeling better today.   Inpatient Medications    Scheduled Meds: . alendronate  70 mg Oral Q Mon  . cholecalciferol  2,000 Units Oral Daily  . enoxaparin (LOVENOX) injection  40 mg Subcutaneous Q24H  . rosuvastatin  20 mg Oral Daily   Continuous Infusions:  PRN Meds: acetaminophen, nitroGLYCERIN, ondansetron (ZOFRAN) IV   Vital Signs    Vitals:   08/14/19 0731 08/14/19 0800 08/14/19 0858 08/14/19 0900  BP: 130/76  (!) 146/76 (!) 146/76  Pulse: 97 89 85 88  Resp: 20 16  17   Temp: 98.4 F (36.9 C)     TempSrc: Oral     SpO2: 99% 98%    Weight:      Height:        Intake/Output Summary (Last 24 hours) at 08/14/2019 1035 Last data filed at 08/13/2019 2036 Gross per 24 hour  Intake 506.78 ml  Output --  Net 506.78 ml   Filed Weights   08/13/19 1700  Weight: 63.4 kg    Telemetry    SR, rate 72 - Personally Reviewed  ECG    SR 83 - Personally Reviewed  Physical Exam   GEN: No acute distress.   Neck: No JVD Cardiac: regular rhythm, normal rate, no murmurs, rubs, or gallops.  Respiratory: Clear to auscultation bilaterally. GI: Soft, nontender, non-distended  MS: No edema; No deformity. Neuro:  Nonfocal  Psych: Normal affect   Labs    Chemistry Recent Labs  Lab 08/13/19 0149 08/13/19 1816 08/14/19 0540  NA 138  --  138  K 3.6  --  4.2  CL 106  --  110  CO2 21*  --  19*  GLUCOSE 106*  --  101*  BUN 17  --  20  CREATININE 0.78 1.22* 0.78  CALCIUM 9.7  --  8.5*  GFRNONAA >60 47* >60  GFRAA >60 55* >60  ANIONGAP 11  --  9     Hematology Recent Labs  Lab 08/13/19 0149 08/13/19 1816  WBC 4.9 4.6  RBC 4.90 4.64  HGB 14.4 13.5  HCT 42.5 40.8  MCV 86.7 87.9  MCH 29.4 29.1  MCHC 33.9 33.1  RDW 11.7 11.9  PLT 354 329    Cardiac EnzymesNo results for input(s): TROPONINI in the last 168 hours. No results for  input(s): TROPIPOC in the last 168 hours.   BNPNo results for input(s): BNP, PROBNP in the last 168 hours.   DDimer No results for input(s): DDIMER in the last 168 hours.   Radiology    DG Chest 2 View  Result Date: 08/13/2019 CLINICAL DATA:  Shortness of breath EXAM: CHEST - 2 VIEW COMPARISON:  December 01, 2017 FINDINGS: The heart size and mediastinal contours are within normal limits. Both lungs are clear. The visualized skeletal structures are unremarkable. IMPRESSION: No active cardiopulmonary disease. Electronically Signed   By: Prudencio Pair M.D.   On: 08/13/2019 02:34   CT Angio Chest PE W/Cm &/Or Wo Cm  Result Date: 08/13/2019 CLINICAL DATA:  Chest pain, shortness of breath EXAM: CT ANGIOGRAPHY CHEST WITH CONTRAST TECHNIQUE: Multidetector CT imaging of the chest was performed using the standard protocol during bolus administration of intravenous contrast. Multiplanar CT image reconstructions and MIPs were obtained to evaluate the vascular anatomy. CONTRAST:  26mL OMNIPAQUE IOHEXOL 350 MG/ML SOLN COMPARISON:  Thyroid ultrasound, 12/11/2016, ultrasound-guided FNA,  01/07/2017 FINDINGS: Cardiovascular: Satisfactory opacification of the pulmonary arteries to the segmental level. No evidence of pulmonary embolism. Normal heart size. Left coronary artery calcifications. No pericardial effusion. Mediastinum/Nodes: No enlarged mediastinal, hilar, or axillary lymph nodes. Nodule of the right lobe of the thyroid measuring 1.2 cm, previously assessed by thyroid ultrasound and biopsy. Trachea, and esophagus demonstrate no significant findings. Lungs/Pleura: Lungs are clear. No pleural effusion or pneumothorax. Upper Abdomen: No acute abnormality. Musculoskeletal: No chest wall abnormality. No acute or significant osseous findings. Review of the MIP images confirms the above findings. IMPRESSION: 1.  Negative examination for pulmonary embolism. 2.  Coronary artery disease. 3. Right thyroid nodule, previously  biopsied. No imaging follow-up required. (Ref: J Am Coll Radiol. 2015 Feb;12(2): 143-50). Electronically Signed   By: Eddie Candle M.D.   On: 08/13/2019 10:27   ECHOCARDIOGRAM COMPLETE  Result Date: 08/13/2019    ECHOCARDIOGRAM REPORT   Patient Name:   [name] Date of Exam: 08/13/2019 Medical Rec #:  [id number]     Height:       66.0 in Accession #:    [id number]    Weight:       136.0 lb Date of Birth:  1955-01-14     BSA:          1.697 m Patient Age:    64 years      BP:           147/83 mmHg Patient Gender: F             HR:           74 bpm. Exam Location:  Inpatient Procedure: 2D Echo, Cardiac Doppler and Color Doppler Indications:    Dyspnea  History:        Patient has no prior history of Echocardiogram examinations.                 Signs/Symptoms:Dyspnea; Risk Factors:Dyslipidemia. Dyspnea on                 exertion.  Sonographer:    Clayton Lefort RDCS (AE) Referring Phys: [place]  1. Normal Echo.  2. Left ventricular ejection fraction, by estimation, is 60 to 65%. The left ventricle has normal function. The left ventricle has no regional wall motion abnormalities. Left ventricular diastolic parameters were normal.  3. Right ventricular systolic function is normal. The right ventricular size is normal. There is normal pulmonary artery systolic pressure.  4. The mitral valve is normal in structure. No evidence of mitral valve regurgitation. No evidence of mitral stenosis.  5. The aortic valve is normal in structure. Aortic valve regurgitation is not visualized. No aortic stenosis is present. FINDINGS  Left Ventricle: Left ventricular ejection fraction, by estimation, is 60 to 65%. The left ventricle has normal function. The left ventricle has no regional wall motion abnormalities. The left ventricular internal cavity size was normal in size. There is  no left ventricular hypertrophy. Left ventricular diastolic parameters were normal. Right Ventricle: The right ventricular size is  normal. No increase in right ventricular wall thickness. Right ventricular systolic function is normal. There is normal pulmonary artery systolic pressure. The tricuspid regurgitant velocity is 2.54 m/s, and  with an assumed right atrial pressure of 3 mmHg, the estimated right ventricular systolic pressure is 27.0 mmHg. Left Atrium: Left atrial size was normal in size. Right Atrium: Right atrial size was normal in size. Pericardium: There is no evidence of pericardial effusion. Mitral Valve:  The mitral valve is normal in structure. No evidence of mitral valve regurgitation. No evidence of mitral valve stenosis. MV peak gradient, 3.8 mmHg. The mean mitral valve gradient is 2.0 mmHg. Tricuspid Valve: The tricuspid valve is normal in structure. Tricuspid valve regurgitation is trivial. Aortic Valve: The aortic valve is normal in structure. Aortic valve regurgitation is not visualized. No aortic stenosis is present. Aortic valve mean gradient measures 5.0 mmHg. Aortic valve peak gradient measures 8.4 mmHg. Aortic valve area, by VTI measures 1.94 cm. Pulmonic Valve: The pulmonic valve was normal in structure. Pulmonic valve regurgitation is not visualized. Aorta: The aortic root and ascending aorta are structurally normal, with no evidence of dilitation. IAS/Shunts: The atrial septum is grossly normal. Additional Comments: Normal Echo.  LEFT VENTRICLE PLAX 2D LVIDd:         4.20 cm  Diastology LVIDs:         2.80 cm  LV e' lateral:   10.90 cm/s LV PW:         1.00 cm  LV E/e' lateral: 7.0 LV IVS:        0.90 cm  LV e' medial:    9.68 cm/s LVOT diam:     1.80 cm  LV E/e' medial:  7.8 LV SV:         58 LV SV Index:   34 LVOT Area:     2.54 cm  RIGHT VENTRICLE RV Basal diam:  3.40 cm RV S prime:     13.90 cm/s TAPSE (M-mode): 2.6 cm LEFT ATRIUM             Index       RIGHT ATRIUM           Index LA diam:        2.70 cm 1.59 cm/m  RA Area:     16.20 cm LA Vol (A2C):   43.3 ml 25.51 ml/m RA Volume:   42.30 ml  24.92  ml/m LA Vol (A4C):   28.7 ml 16.91 ml/m LA Biplane Vol: 36.2 ml 21.33 ml/m  AORTIC VALVE AV Area (Vmax):    2.14 cm AV Area (Vmean):   1.69 cm AV Area (VTI):     1.94 cm AV Vmax:           145.00 cm/s AV Vmean:          103.000 cm/s AV VTI:            0.297 m AV Peak Grad:      8.4 mmHg AV Mean Grad:      5.0 mmHg LVOT Vmax:         122.00 cm/s LVOT Vmean:        68.500 cm/s LVOT VTI:          0.226 m LVOT/AV VTI ratio: 0.76  AORTA Ao Root diam: 3.10 cm Ao Asc diam:  3.10 cm MITRAL VALVE               TRICUSPID VALVE MV Area (PHT): 2.91 cm    TR Peak grad:   25.8 mmHg MV Peak grad:  3.8 mmHg    TR Vmax:        254.00 cm/s MV Mean grad:  2.0 mmHg MV Vmax:       0.97 m/s    SHUNTS MV Vmean:      59.5 cm/s   Systemic VTI:  0.23 m MV Decel Time: 261 msec    Systemic Diam: 1.80 cm MV E  velocity: 75.80 cm/s MV A velocity: 67.30 cm/s MV E/A ratio:  1.13 Mertie Moores MD Electronically signed by Mertie Moores MD Signature Date/Time: 08/13/2019/5:55:47 PM    Final     Cardiac Studies   Echo wnl  Patient Profile     Nan Maya Nease is a 64 y.o. female with history of hyperlipidemia.  She is admitted to the hospital with profound shortness of breath with exertion.  Assessment & Plan   Active Problems:   Dyspnea   Dyspnea on exertion   CCTA today for eval of CAD given family history and acute presentation of symptoms. Metoprolol 100 mg, rates improved hopefully optimized for scan.   Consider RHC tomorrow if CCTA nonobstructive.   Discussed in detail with patient and husband in room.   For questions or updates, please contact [place] Please consult [email or web address] for contact info under        Signed, Elouise Munroe, MD  08/14/2019, 10:35 AM    CCTA nonobstructive. Will plan for RHC - informed consent obtained.  INFORMED CONSENT: I have reviewed the risks, indications, and alternatives to cardiac catheterization with the patient. Risks include but are not limited to bleeding,  infection, vascular injury, stroke, myocardial infection, arrhythmia, kidney injury, radiation-related injury in the case of prolonged fluoroscopy use, emergency cardiac surgery, and death. The patient understands the risks of serious complication is 1-2 in 3754 with diagnostic cardiac cath.

## 2019-08-14 NOTE — Plan of Care (Signed)

## 2019-08-14 NOTE — Plan of Care (Signed)
  Problem: Education: Goal: Knowledge of General Education information will improve Description: Including pain rating scale, medication(s)/side effects and non-pharmacologic comfort measures Outcome: Progressing   Problem: Health Behavior/Discharge Planning: Goal: Ability to manage health-related needs will improve Outcome: Progressing   Problem: Clinical Measurements: Goal: Ability to maintain clinical measurements within normal limits will improve Outcome: Progressing Goal: Will remain free from infection Outcome: Progressing Goal: Diagnostic test results will improve Outcome: Progressing Goal: Respiratory complications will improve Outcome: Progressing Goal: Cardiovascular complication will be avoided Outcome: Progressing   Problem: Activity: Goal: Risk for activity intolerance will decrease Outcome: Progressing   Problem: Nutrition: Goal: Adequate nutrition will be maintained Outcome: Progressing   Problem: Elimination: Goal: Will not experience complications related to bowel motility Outcome: Progressing Goal: Will not experience complications related to urinary retention Outcome: Progressing   Problem: Coping: Goal: Level of anxiety will decrease Outcome: Progressing   Problem: Pain Managment: Goal: General experience of comfort will improve Outcome: Progressing   Problem: Safety: Goal: Ability to remain free from injury will improve Outcome: Progressing   Problem: Skin Integrity: Goal: Risk for impaired skin integrity will decrease Outcome: Progressing   Problem: Education: Goal: Ability to demonstrate management of disease process will improve Outcome: Progressing Goal: Ability to verbalize understanding of medication therapies will improve Outcome: Progressing Goal: Individualized Educational Video(s) Outcome: Progressing   Problem: Activity: Goal: Capacity to carry out activities will improve Outcome: Progressing   Problem: Cardiac: Goal:  Ability to achieve and maintain adequate cardiopulmonary perfusion will improve Outcome: Progressing

## 2019-08-15 ENCOUNTER — Encounter (HOSPITAL_COMMUNITY): Payer: Self-pay | Admitting: Internal Medicine

## 2019-08-15 ENCOUNTER — Other Ambulatory Visit: Payer: Self-pay | Admitting: Cardiology

## 2019-08-15 ENCOUNTER — Encounter (HOSPITAL_COMMUNITY)
Admission: EM | Disposition: A | Discharge status: Home / Self Care | Payer: Self-pay | Source: Home / Self Care | Attending: Cardiovascular Disease

## 2019-08-15 DIAGNOSIS — R0609 Other forms of dyspnea: Secondary | ICD-10-CM

## 2019-08-15 DIAGNOSIS — R0600 Dyspnea, unspecified: Secondary | ICD-10-CM | POA: Diagnosis not present

## 2019-08-15 HISTORY — PX: RIGHT HEART CATH: CATH118263

## 2019-08-15 LAB — POCT I-STAT EG7
Acid-base deficit: 2 mmol/L (ref 0.0–2.0)
Acid-base deficit: 3 mmol/L — ABNORMAL HIGH (ref 0.0–2.0)
Acid-base deficit: 4 mmol/L — ABNORMAL HIGH (ref 0.0–2.0)
Bicarbonate: 19.6 mmol/L — ABNORMAL LOW (ref 20.0–28.0)
Bicarbonate: 21.1 mmol/L (ref 20.0–28.0)
Bicarbonate: 21.8 mmol/L (ref 20.0–28.0)
Calcium, Ion: 1.02 mmol/L — ABNORMAL LOW (ref 1.15–1.40)
Calcium, Ion: 1.07 mmol/L — ABNORMAL LOW (ref 1.15–1.40)
Calcium, Ion: 1.19 mmol/L (ref 1.15–1.40)
HCT: 34 % — ABNORMAL LOW (ref 36.0–46.0)
HCT: 34 % — ABNORMAL LOW (ref 36.0–46.0)
HCT: 36 % (ref 36.0–46.0)
Hemoglobin: 11.6 g/dL — ABNORMAL LOW (ref 12.0–15.0)
Hemoglobin: 11.6 g/dL — ABNORMAL LOW (ref 12.0–15.0)
Hemoglobin: 12.2 g/dL (ref 12.0–15.0)
O2 Saturation: 76 %
O2 Saturation: 77 %
O2 Saturation: 80 %
Potassium: 3 mmol/L — ABNORMAL LOW (ref 3.5–5.1)
Potassium: 3 mmol/L — ABNORMAL LOW (ref 3.5–5.1)
Potassium: 3.4 mmol/L — ABNORMAL LOW (ref 3.5–5.1)
Sodium: 141 mmol/L (ref 135–145)
Sodium: 144 mmol/L (ref 135–145)
Sodium: 145 mmol/L (ref 135–145)
TCO2: 20 mmol/L — ABNORMAL LOW (ref 22–32)
TCO2: 22 mmol/L (ref 22–32)
TCO2: 23 mmol/L (ref 22–32)
pCO2, Ven: 29.2 mmHg — ABNORMAL LOW (ref 44.0–60.0)
pCO2, Ven: 31.9 mmHg — ABNORMAL LOW (ref 44.0–60.0)
pCO2, Ven: 34 mmHg — ABNORMAL LOW (ref 44.0–60.0)
pH, Ven: 7.4 (ref 7.250–7.430)
pH, Ven: 7.434 — ABNORMAL HIGH (ref 7.250–7.430)
pH, Ven: 7.441 — ABNORMAL HIGH (ref 7.250–7.430)
pO2, Ven: 39 mmHg (ref 32.0–45.0)
pO2, Ven: 40 mmHg (ref 32.0–45.0)
pO2, Ven: 43 mmHg (ref 32.0–45.0)

## 2019-08-15 SURGERY — RIGHT HEART CATH

## 2019-08-15 MED ORDER — HEPARIN (PORCINE) IN NACL 1000-0.9 UT/500ML-% IV SOLN
INTRAVENOUS | Status: AC
  Filled 2019-08-15: qty 1000

## 2019-08-15 MED ORDER — SODIUM CHLORIDE 0.9 % IV SOLN: 250.0000 mL | INTRAVENOUS | Status: DC | PRN

## 2019-08-15 MED ORDER — ASPIRIN 81 MG PO CHEW
81.0000 mg | CHEWABLE_TABLET | Freq: Once | ORAL | Status: AC
  Administered 2019-08-15: 81 mg via ORAL
  Filled 2019-08-15: qty 1

## 2019-08-15 MED ORDER — MIDAZOLAM HCL 2 MG/2ML IJ SOLN
INTRAMUSCULAR | Status: AC
  Filled 2019-08-15: qty 2

## 2019-08-15 MED ORDER — LIDOCAINE HCL (PF) 1 % IJ SOLN
INTRAMUSCULAR | Status: AC
  Filled 2019-08-15: qty 30

## 2019-08-15 MED ORDER — FENTANYL CITRATE (PF) 100 MCG/2ML IJ SOLN
INTRAMUSCULAR | Status: AC
  Filled 2019-08-15: qty 2

## 2019-08-15 MED ORDER — HEPARIN (PORCINE) IN NACL 1000-0.9 UT/500ML-% IV SOLN
INTRAVENOUS | Status: DC | PRN
  Administered 2019-08-15: 500 mL

## 2019-08-15 MED ORDER — ACETAMINOPHEN 325 MG PO TABS: 650.0000 mg | ORAL_TABLET | ORAL | Status: DC | PRN

## 2019-08-15 MED ORDER — SODIUM CHLORIDE 0.9% FLUSH: 3.0000 mL | Freq: Two times a day (BID) | INTRAVENOUS | Status: DC

## 2019-08-15 MED ORDER — ONDANSETRON HCL 4 MG/2ML IJ SOLN: 4.0000 mg | Freq: Four times a day (QID) | INTRAMUSCULAR | Status: DC | PRN

## 2019-08-15 MED ORDER — LIDOCAINE HCL (PF) 1 % IJ SOLN
INTRAMUSCULAR | Status: DC | PRN
  Administered 2019-08-15: 2 mL

## 2019-08-15 MED ORDER — FENTANYL CITRATE (PF) 100 MCG/2ML IJ SOLN
INTRAMUSCULAR | Status: DC | PRN
  Administered 2019-08-15: 25 ug via INTRAVENOUS

## 2019-08-15 MED ORDER — SODIUM CHLORIDE 0.9 % IV SOLN
INTRAVENOUS | Status: AC | PRN
  Administered 2019-08-15: 10 mL/h via INTRAVENOUS

## 2019-08-15 MED ORDER — LABETALOL HCL 5 MG/ML IV SOLN: 10.0000 mg | INTRAVENOUS | Status: DC | PRN

## 2019-08-15 MED ORDER — HYDRALAZINE HCL 20 MG/ML IJ SOLN: 10.0000 mg | INTRAMUSCULAR | Status: DC | PRN

## 2019-08-15 MED ORDER — SODIUM CHLORIDE 0.9% FLUSH: 3.0000 mL | INTRAVENOUS | Status: DC | PRN

## 2019-08-15 MED ORDER — MIDAZOLAM HCL 2 MG/2ML IJ SOLN
INTRAMUSCULAR | Status: DC | PRN
  Administered 2019-08-15: 1 mg via INTRAVENOUS

## 2019-08-15 SURGICAL SUPPLY — 7 items
CATH BALLN WEDGE 5F 110CM (CATHETERS) ×1 IMPLANT
GUIDEWIRE .025 260CM (WIRE) ×1 IMPLANT
PACK CARDIAC CATHETERIZATION (CUSTOM PROCEDURE TRAY) ×2 IMPLANT
SHEATH GLIDE SLENDER 4/5FR (SHEATH) ×1 IMPLANT
TRANSDUCER W/STOPCOCK (MISCELLANEOUS) ×2 IMPLANT
TUBING ART PRESS 72  MALE/FEM (TUBING) ×2
TUBING ART PRESS 72 MALE/FEM (TUBING) IMPLANT

## 2019-08-15 NOTE — Progress Notes (Signed)
Progress Note  Patient Name: [name] Date of Encounter: 08/15/2019  Primary Cardiologist: Mertie Moores, MD   Subjective   Nervous about RHC, discussed in detail. Eager to go home when workup complete.  Inpatient Medications    Scheduled Meds: . alendronate  70 mg Oral Q Mon  . cholecalciferol  2,000 Units Oral Daily  . enoxaparin (LOVENOX) injection  40 mg Subcutaneous Q24H  . rosuvastatin  20 mg Oral Daily  . sodium chloride flush  3 mL Intravenous Q12H   Continuous Infusions: . sodium chloride    . sodium chloride 10 mL/hr at 08/15/19 0607   PRN Meds: sodium chloride, acetaminophen, iohexol, nitroGLYCERIN, ondansetron (ZOFRAN) IV, sodium chloride flush   Vital Signs    Vitals:   08/15/19 0300 08/15/19 0518 08/15/19 0522 08/15/19 0607  BP:   133/67   Pulse: 74 78 81   Resp: 18 18 11    Temp:   98 F (36.7 C)   TempSrc:   Oral   SpO2: 95% 95% 99%   Weight:    59.1 kg  Height:        Intake/Output Summary (Last 24 hours) at 08/15/2019 0851 Last data filed at 08/14/2019 1928 Gross per 24 hour  Intake 240 ml  Output --  Net 240 ml   Filed Weights   08/13/19 1700 08/15/19 0607  Weight: 63.4 kg 59.1 kg    Telemetry    SR, rate 72 - Personally Reviewed  ECG    SR 83 - Personally Reviewed  Physical Exam   GEN: No acute distress.   Neck: No JVD Cardiac: RRR, no murmurs, rubs, or gallops.  Respiratory: Clear to auscultation bilaterally. GI: Soft, nontender, non-distended  MS: No edema; No deformity. Neuro:  Nonfocal  Psych: Normal affect     Labs    Chemistry Recent Labs  Lab 08/13/19 0149 08/13/19 1816 08/14/19 0540  NA 138  --  138  K 3.6  --  4.2  CL 106  --  110  CO2 21*  --  19*  GLUCOSE 106*  --  101*  BUN 17  --  20  CREATININE 0.78 1.22* 0.78  CALCIUM 9.7  --  8.5*  GFRNONAA >60 47* >60  GFRAA >60 55* >60  ANIONGAP 11  --  9     Hematology Recent Labs  Lab 08/13/19 0149 08/13/19 1816  WBC 4.9 4.6  RBC 4.90 4.64   HGB 14.4 13.5  HCT 42.5 40.8  MCV 86.7 87.9  MCH 29.4 29.1  MCHC 33.9 33.1  RDW 11.7 11.9  PLT 354 329    Cardiac EnzymesNo results for input(s): TROPONINI in the last 168 hours. No results for input(s): TROPIPOC in the last 168 hours.   BNPNo results for input(s): BNP, PROBNP in the last 168 hours.   DDimer No results for input(s): DDIMER in the last 168 hours.   Radiology    CT Angio Chest PE W/Cm &/Or Wo Cm  Result Date: 08/13/2019 CLINICAL DATA:  Chest pain, shortness of breath EXAM: CT ANGIOGRAPHY CHEST WITH CONTRAST TECHNIQUE: Multidetector CT imaging of the chest was performed using the standard protocol during bolus administration of intravenous contrast. Multiplanar CT image reconstructions and MIPs were obtained to evaluate the vascular anatomy. CONTRAST:  37mL OMNIPAQUE IOHEXOL 350 MG/ML SOLN COMPARISON:  Thyroid ultrasound, 12/11/2016, ultrasound-guided FNA, 01/07/2017 FINDINGS: Cardiovascular: Satisfactory opacification of the pulmonary arteries to the segmental level. No evidence of pulmonary embolism. Normal heart size. Left coronary artery  calcifications. No pericardial effusion. Mediastinum/Nodes: No enlarged mediastinal, hilar, or axillary lymph nodes. Nodule of the right lobe of the thyroid measuring 1.2 cm, previously assessed by thyroid ultrasound and biopsy. Trachea, and esophagus demonstrate no significant findings. Lungs/Pleura: Lungs are clear. No pleural effusion or pneumothorax. Upper Abdomen: No acute abnormality. Musculoskeletal: No chest wall abnormality. No acute or significant osseous findings. Review of the MIP images confirms the above findings. IMPRESSION: 1.  Negative examination for pulmonary embolism. 2.  Coronary artery disease. 3. Right thyroid nodule, previously biopsied. No imaging follow-up required. (Ref: J Am Coll Radiol. 2015 Feb;12(2): 143-50). Electronically Signed   By: Eddie Candle M.D.   On: 08/13/2019 10:27   CT CORONARY MORPH W/CTA COR  W/SCORE W/CA W/CM &/OR WO/CM  Addendum Date: 08/14/2019   ADDENDUM REPORT: 08/14/2019 16:58 HISTORY: Chest pain/anginal equiv, ECGs and troponins normal Dyspnea on exertion (DOE) EXAM: Cardiac/Coronary  CT TECHNIQUE: The patient was scanned on a [employer]. PROTOCOL: A 120 kV prospective scan was triggered in the descending thoracic aorta at 111 HU's. Axial non-contrast 3 mm slices were carried out through the heart. The data set was analyzed on a dedicated work station and scored using the [place]. Gantry rotation speed was 250 msecs and collimation was .6 mm. Beta blockade and 0.8 mg of sl NTG was given. The 3D data set was reconstructed in 5% intervals of the 67-82 % of the R-R cycle. Diastolic phases were analyzed on a dedicated work station using MPR, MIP and VRT modes. The patient received 15mL OMNIPAQUE IOHEXOL 350 MG/ML SOLN of contrast. FINDINGS: Coronary calcium score is 77, which places the patient in the 84th percentile for age and sex matched control. Coronary arteries: Normal coronary origins.  Right dominance. Right Coronary Artery: No detectable plaque or stenosis. Left Main Coronary Artery: Minimal mixed atherosclerotic plaque in the mid left main, <25% stenosis. Left Anterior Descending Coronary Artery: Mild mixed atherosclerotic plaque in the mid LAD, 25-49% stenosis. The mid-distal LAD evaluation is significantly impacted by stair step motion artifact. Evaluation in multiple phases of cardiac cycle, unable to definitely assess given proximity to calcifications. Distal vessel is patent without significant stenosis. Ramus intermedius: Proximal to mid vessel evaluation impacted by motion artifact, however no calcifications, no significant stenosis noted. Left Circumflex Artery: Diminutive circumflex artery. Aorta: Normal size, 33 mm at the mid ascending aorta (level of the PA bifurcation) measured double oblique. No calcifications. No dissection. Aortic Valve: No calcifications.  Other findings: Normal pulmonary vein drainage into the left atrium. Normal left atrial appendage without a thrombus. Normal size of the pulmonary artery. IMPRESSION: 1. Mild CAD, CADRADS = 2. 2. Coronary calcium score is 77, which places the patient in the 84th percentile for age and sex matched control. 3. Normal coronary origin with right dominance. Electronically Signed   By: Cherlynn Kaiser   On: 08/14/2019 16:58   Result Date: 08/14/2019 EXAM: OVER-READ INTERPRETATION  CT CHEST The following report is an over-read performed by radiologist Dr. Suzy Bouchard of [facility], [place] on 08/14/2019. This over-read does not include interpretation of cardiac or coronary anatomy or pathology. The coronary calcium score/coronary CTA interpretation by the cardiologist is attached. COMPARISON:  Chest CT 08/13/2019 FINDINGS: Limited view of the lung parenchyma demonstrates no suspicious nodularity. Airways are normal. Limited view of the mediastinum demonstrates no adenopathy. Esophagus normal. Limited view of the upper abdomen unremarkable. Limited view of the skeleton and chest wall is unremarkable. IMPRESSION: No significant extracardiac findings. Electronically Signed:  By: Suzy Bouchard M.D. On: 08/14/2019 12:11   ECHOCARDIOGRAM COMPLETE  Result Date: 08/13/2019    ECHOCARDIOGRAM REPORT   Patient Name:   BREAWNA MONTENEGRO Date of Exam: 08/13/2019 Medical Rec #:  [id number]     Height:       66.0 in Accession #:    [id number]    Weight:       136.0 lb Date of Birth:  02/20/1955     BSA:          1.697 m Patient Age:    55 years      BP:           147/83 mmHg Patient Gender: F             HR:           74 bpm. Exam Location:  Inpatient Procedure: 2D Echo, Cardiac Doppler and Color Doppler Indications:    Dyspnea  History:        Patient has no prior history of Echocardiogram examinations.                 Signs/Symptoms:Dyspnea; Risk Factors:Dyslipidemia. Dyspnea on                 exertion.  Sonographer:    Clayton Lefort RDCS (AE) Referring Phys: [place]  1. Normal Echo.  2. Left ventricular ejection fraction, by estimation, is 60 to 65%. The left ventricle has normal function. The left ventricle has no regional wall motion abnormalities. Left ventricular diastolic parameters were normal.  3. Right ventricular systolic function is normal. The right ventricular size is normal. There is normal pulmonary artery systolic pressure.  4. The mitral valve is normal in structure. No evidence of mitral valve regurgitation. No evidence of mitral stenosis.  5. The aortic valve is normal in structure. Aortic valve regurgitation is not visualized. No aortic stenosis is present. FINDINGS  Left Ventricle: Left ventricular ejection fraction, by estimation, is 60 to 65%. The left ventricle has normal function. The left ventricle has no regional wall motion abnormalities. The left ventricular internal cavity size was normal in size. There is  no left ventricular hypertrophy. Left ventricular diastolic parameters were normal. Right Ventricle: The right ventricular size is normal. No increase in right ventricular wall thickness. Right ventricular systolic function is normal. There is normal pulmonary artery systolic pressure. The tricuspid regurgitant velocity is 2.54 m/s, and  with an assumed right atrial pressure of 3 mmHg, the estimated right ventricular systolic pressure is 09.3 mmHg. Left Atrium: Left atrial size was normal in size. Right Atrium: Right atrial size was normal in size. Pericardium: There is no evidence of pericardial effusion. Mitral Valve: The mitral valve is normal in structure. No evidence of mitral valve regurgitation. No evidence of mitral valve stenosis. MV peak gradient, 3.8 mmHg. The mean mitral valve gradient is 2.0 mmHg. Tricuspid Valve: The tricuspid valve is normal in structure. Tricuspid valve regurgitation is trivial. Aortic Valve: The aortic valve is normal in structure. Aortic valve  regurgitation is not visualized. No aortic stenosis is present. Aortic valve mean gradient measures 5.0 mmHg. Aortic valve peak gradient measures 8.4 mmHg. Aortic valve area, by VTI measures 1.94 cm. Pulmonic Valve: The pulmonic valve was normal in structure. Pulmonic valve regurgitation is not visualized. Aorta: The aortic root and ascending aorta are structurally normal, with no evidence of dilitation. IAS/Shunts: The atrial septum is grossly normal. Additional Comments: Normal Echo.  LEFT VENTRICLE PLAX  2D LVIDd:         4.20 cm  Diastology LVIDs:         2.80 cm  LV e' lateral:   10.90 cm/s LV PW:         1.00 cm  LV E/e' lateral: 7.0 LV IVS:        0.90 cm  LV e' medial:    9.68 cm/s LVOT diam:     1.80 cm  LV E/e' medial:  7.8 LV SV:         58 LV SV Index:   34 LVOT Area:     2.54 cm  RIGHT VENTRICLE RV Basal diam:  3.40 cm RV S prime:     13.90 cm/s TAPSE (M-mode): 2.6 cm LEFT ATRIUM             Index       RIGHT ATRIUM           Index LA diam:        2.70 cm 1.59 cm/m  RA Area:     16.20 cm LA Vol (A2C):   43.3 ml 25.51 ml/m RA Volume:   42.30 ml  24.92 ml/m LA Vol (A4C):   28.7 ml 16.91 ml/m LA Biplane Vol: 36.2 ml 21.33 ml/m  AORTIC VALVE AV Area (Vmax):    2.14 cm AV Area (Vmean):   1.69 cm AV Area (VTI):     1.94 cm AV Vmax:           145.00 cm/s AV Vmean:          103.000 cm/s AV VTI:            0.297 m AV Peak Grad:      8.4 mmHg AV Mean Grad:      5.0 mmHg LVOT Vmax:         122.00 cm/s LVOT Vmean:        68.500 cm/s LVOT VTI:          0.226 m LVOT/AV VTI ratio: 0.76  AORTA Ao Root diam: 3.10 cm Ao Asc diam:  3.10 cm MITRAL VALVE               TRICUSPID VALVE MV Area (PHT): 2.91 cm    TR Peak grad:   25.8 mmHg MV Peak grad:  3.8 mmHg    TR Vmax:        254.00 cm/s MV Mean grad:  2.0 mmHg MV Vmax:       0.97 m/s    SHUNTS MV Vmean:      59.5 cm/s   Systemic VTI:  0.23 m MV Decel Time: 261 msec    Systemic Diam: 1.80 cm MV E velocity: 75.80 cm/s MV A velocity: 67.30 cm/s MV E/A ratio:  1.13  Mertie Moores MD Electronically signed by Mertie Moores MD Signature Date/Time: 08/13/2019/5:55:47 PM    Final     Cardiac Studies   Echo wnl. Mild disease on CCTA in LAD.   Patient Profile     RATASHA FABRE is a 64 y.o. female with history of hyperlipidemia.  She is admitted to the hospital with profound shortness of breath with exertion.  Assessment & Plan   Active Problems:   Dyspnea   Dyspnea on exertion   CCTA nonobstructive. Will plan for RHC - informed consent obtained. If RHC demonstrates no shunt, would attribute to viral illness with possible reactive airway component.  INFORMED CONSENT: I have reviewed the risks, indications, and  alternatives to cardiac catheterization with the patient. Risks include but are not limited to bleeding, infection, vascular injury, stroke, myocardial infection, arrhythmia, kidney injury, radiation-related injury in the case of prolonged fluoroscopy use, emergency cardiac surgery, and death. The patient understands the risks of serious complication is 1-2 in 2984 with diagnostic cardiac cath.

## 2019-08-15 NOTE — Plan of Care (Signed)

## 2019-08-15 NOTE — Discharge Summary (Addendum)
Discharge Summary    Patient ID: [name] MRN: [id number]; DOB: November 02, 1955  Admit date: 08/13/2019 Discharge date: 08/15/2019  Primary Care Provider: [name], MD  Primary Cardiologist: Mertie Moores, MD  Primary Electrophysiologist:  None   Discharge Diagnoses    Principal Problem:   Dyspnea on exertion Active Problems:   Dyspnea    Diagnostic Studies/Procedures    Cardiac CTA  FINDINGS: Coronary calcium score is 77, which places the patient in the 84th percentile for age and sex matched control.  Coronary arteries: Normal coronary origins.  Right dominance.  Right Coronary Artery: No detectable plaque or stenosis.  Left Main Coronary Artery: Minimal mixed atherosclerotic plaque in the mid left main, <25% stenosis.  Left Anterior Descending Coronary Artery: Mild mixed atherosclerotic plaque in the mid LAD, 25-49% stenosis. The mid-distal LAD evaluation is significantly impacted by stair step motion artifact. Evaluation in multiple phases of cardiac cycle, unable to definitely assess given proximity to calcifications. Distal vessel is patent without significant stenosis.  Ramus intermedius: Proximal to mid vessel evaluation impacted by motion artifact, however no calcifications, no significant stenosis noted.  Left Circumflex Artery: Diminutive circumflex artery.  Aorta: Normal size, 33 mm at the mid ascending aorta (level of the PA bifurcation) measured double oblique. No calcifications. No dissection.  Aortic Valve: No calcifications.  Other findings:  Normal pulmonary vein drainage into the left atrium.  Normal left atrial appendage without a thrombus.  Normal size of the pulmonary artery.  IMPRESSION: 1. Mild CAD, CADRADS = 2.  2. Coronary calcium score is 77, which places the patient in the 84th percentile for age and sex matched control.  3. Normal coronary origin with right dominance.  Echo 08/13/19  IMPRESSIONS     1. Normal Echo.  2. Left ventricular ejection fraction, by estimation, is 60 to 65%. The  left ventricle has normal function. The left ventricle has no regional  wall motion abnormalities. Left ventricular diastolic parameters were  normal.  3. Right ventricular systolic function is normal. The right ventricular  size is normal. There is normal pulmonary artery systolic pressure.  4. The mitral valve is normal in structure. No evidence of mitral valve  regurgitation. No evidence of mitral stenosis.  5. The aortic valve is normal in structure. Aortic valve regurgitation is  not visualized. No aortic stenosis is present.   FINDINGS  Left Ventricle: Left ventricular ejection fraction, by estimation, is 60  to 65%. The left ventricle has normal function. The left ventricle has no  regional wall motion abnormalities. The left ventricular internal cavity  size was normal in size. There is  no left ventricular hypertrophy. Left ventricular diastolic parameters  were normal.   Right Ventricle: The right ventricular size is normal. No increase in  right ventricular wall thickness. Right ventricular systolic function is  normal. There is normal pulmonary artery systolic pressure. The tricuspid  regurgitant velocity is 2.54 m/s, and  with an assumed right atrial pressure of 3 mmHg, the estimated right  ventricular systolic pressure is 33.2 mmHg.   Left Atrium: Left atrial size was normal in size.   Right Atrium: Right atrial size was normal in size.   Pericardium: There is no evidence of pericardial effusion.   Mitral Valve: The mitral valve is normal in structure. No evidence of  mitral valve regurgitation. No evidence of mitral valve stenosis. MV peak  gradient, 3.8 mmHg. The mean mitral valve gradient is 2.0 mmHg.   Tricuspid Valve: The tricuspid valve is  normal in structure. Tricuspid  valve regurgitation is trivial.   Aortic Valve: The aortic valve is normal in  structure. Aortic valve  regurgitation is not visualized. No aortic stenosis is present. Aortic  valve mean gradient measures 5.0 mmHg. Aortic valve peak gradient measures  8.4 mmHg. Aortic valve area, by VTI  measures 1.94 cm.   Pulmonic Valve: The pulmonic valve was normal in structure. Pulmonic valve  regurgitation is not visualized.   Aorta: The aortic root and ascending aorta are structurally normal, with  no evidence of dilitation.   IAS/Shunts: The atrial septum is grossly normal.   Additional Comments: Normal Echo.     LEFT VENTRICLE  PLAX 2D  LVIDd:     4.20 cm Diastology  LVIDs:     2.80 cm LV e' lateral:  10.90 cm/s  LV PW:     1.00 cm LV E/e' lateral: 7.0  LV IVS:    0.90 cm LV e' medial:  9.68 cm/s  LVOT diam:   1.80 cm LV E/e' medial: 7.8  LV SV:     58  LV SV Index:  34  LVOT Area:   2.54 cm     RIGHT VENTRICLE  RV Basal diam: 3.40 cm  RV S prime:   13.90 cm/s  TAPSE (M-mode): 2.6 cm  _________    Rt heart cath 08/15/19   Findings:  RA = 3 RV = 26/6 PA = 20/5 (12) PCW = 7 Fick cardiac output/index =  6.5/3.8 PVR = 0.8 WU Ao sat = 96% PA sat = 76%, 77% High SVC sat = 80%  Assessment:  Normal RHC with no evidence of PAH, volume overload or intracardiac shunting.   Plan/Discussion:  If symptoms persist consider CPX testing.   History of Present Illness     [name] is a 64 y.o. female with hx of HLD.  Pt presented to ER 08/13/19 with profound DOE.  She is very active with hiking, playing tennis but with DOE has progressed so that she can only walk 6-7 feet.  She does have premature CAD in her family.   Troponin neg.  CXR negative.  She did desat and was tachycardic with any exertion.  , CTA of chest neg for PE.  She could lie flat without SOB.  She was admitted for further testing.     Hospital Course     Consultants: none  She underwent cardiac CTA   With mild disease of LAD- non  obstructive.  She then underwent RHC that was normal.  All reassuring.  This may be possible viral illness with reactive airway component.     If symptoms do not improve  Consider CPX testing.    She has been seen and evaluated by Dr. Margaretann Loveless and found stable for discharge.  Plan PFTs as outpt .   Continue treatment for HLD.  She will follow up with Dr. Acie Fredrickson.    Did the patient have an acute coronary syndrome (MI, NSTEMI, STEMI, etc) this admission?:  No                               Did the patient have a percutaneous coronary intervention (stent / angioplasty)?:  No.   _____________  Discharge Vitals Blood pressure (!) 141/77, pulse 71, temperature 97.8 F (36.6 C), temperature source Oral, resp. rate 16, height 5\' 6"  (1.676 m), weight 59.1 kg, SpO2 100 %.  Filed Weights  08/13/19 1700 08/15/19 0607  Weight: 63.4 kg 59.1 kg    Labs & Radiologic Studies    CBC Recent Labs    08/13/19 0149 08/13/19 1816  WBC 4.9 4.6  HGB 14.4 13.5  HCT 42.5 40.8  MCV 86.7 87.9  PLT 354 299   Basic Metabolic Panel Recent Labs    08/13/19 0149 08/13/19 0149 08/13/19 1816 08/14/19 0540  NA 138  --   --  138  K 3.6  --   --  4.2  CL 106  --   --  110  CO2 21*  --   --  19*  GLUCOSE 106*  --   --  101*  BUN 17  --   --  20  CREATININE 0.78   < > 1.22* 0.78  CALCIUM 9.7  --   --  8.5*   < > = values in this interval not displayed.   Liver Function Tests No results for input(s): AST, ALT, ALKPHOS, BILITOT, PROT, ALBUMIN in the last 72 hours. No results for input(s): LIPASE, AMYLASE in the last 72 hours. High Sensitivity Troponin:   Recent Labs  Lab 08/13/19 0149 08/13/19 0353 08/13/19 0916 08/13/19 1816  TROPONINIHS 4 5 5 3     BNP Invalid input(s): POCBNP D-Dimer No results for input(s): DDIMER in the last 72 hours. Hemoglobin A1C No results for input(s): HGBA1C in the last 72 hours. Fasting Lipid Panel No results for input(s): CHOL, HDL, LDLCALC, TRIG, CHOLHDL,  LDLDIRECT in the last 72 hours. Thyroid Function Tests No results for input(s): TSH, T4TOTAL, T3FREE, THYROIDAB in the last 72 hours.  Invalid input(s): FREET3 _____________  DG Chest 2 View  Result Date: 08/13/2019 CLINICAL DATA:  Shortness of breath EXAM: CHEST - 2 VIEW COMPARISON:  December 01, 2017 FINDINGS: The heart size and mediastinal contours are within normal limits. Both lungs are clear. The visualized skeletal structures are unremarkable. IMPRESSION: No active cardiopulmonary disease. Electronically Signed   By: Prudencio Pair M.D.   On: 08/13/2019 02:34   CT Angio Chest PE W/Cm &/Or Wo Cm  Result Date: 08/13/2019 CLINICAL DATA:  Chest pain, shortness of breath EXAM: CT ANGIOGRAPHY CHEST WITH CONTRAST TECHNIQUE: Multidetector CT imaging of the chest was performed using the standard protocol during bolus administration of intravenous contrast. Multiplanar CT image reconstructions and MIPs were obtained to evaluate the vascular anatomy. CONTRAST:  79mL OMNIPAQUE IOHEXOL 350 MG/ML SOLN COMPARISON:  Thyroid ultrasound, 12/11/2016, ultrasound-guided FNA, 01/07/2017 FINDINGS: Cardiovascular: Satisfactory opacification of the pulmonary arteries to the segmental level. No evidence of pulmonary embolism. Normal heart size. Left coronary artery calcifications. No pericardial effusion. Mediastinum/Nodes: No enlarged mediastinal, hilar, or axillary lymph nodes. Nodule of the right lobe of the thyroid measuring 1.2 cm, previously assessed by thyroid ultrasound and biopsy. Trachea, and esophagus demonstrate no significant findings. Lungs/Pleura: Lungs are clear. No pleural effusion or pneumothorax. Upper Abdomen: No acute abnormality. Musculoskeletal: No chest wall abnormality. No acute or significant osseous findings. Review of the MIP images confirms the above findings. IMPRESSION: 1.  Negative examination for pulmonary embolism. 2.  Coronary artery disease. 3. Right thyroid nodule, previously biopsied. No  imaging follow-up required. (Ref: J Am Coll Radiol. 2015 Feb;12(2): 143-50). Electronically Signed   By: Eddie Candle M.D.   On: 08/13/2019 10:27   CARDIAC CATHETERIZATION  Result Date: 08/15/2019 Findings: RA = 3 RV = 26/6 PA = 20/5 (12) PCW = 7 Fick cardiac output/index =  6.5/3.8 PVR = 0.8 WU Ao sat = 96%  PA sat = 76%, 77% High SVC sat = 80% Assessment: Normal RHC with no evidence of PAH, volume overload or intracardiac shunting. Plan/Discussion: If symptoms persist consider CPX testing. Glori Bickers, MD 1:10 PM   CT CORONARY MORPH W/CTA COR W/SCORE W/CA W/CM &/OR WO/CM  Addendum Date: 08/14/2019   ADDENDUM REPORT: 08/14/2019 16:58 HISTORY: Chest pain/anginal equiv, ECGs and troponins normal Dyspnea on exertion (DOE) EXAM: Cardiac/Coronary  CT TECHNIQUE: The patient was scanned on a [employer]. PROTOCOL: A 120 kV prospective scan was triggered in the descending thoracic aorta at 111 HU's. Axial non-contrast 3 mm slices were carried out through the heart. The data set was analyzed on a dedicated work station and scored using the [employer]. Gantry rotation speed was 250 msecs and collimation was .6 mm. Beta blockade and 0.8 mg of sl NTG was given. The 3D data set was reconstructed in 5% intervals of the 67-82 % of the R-R cycle. Diastolic phases were analyzed on a dedicated work station using MPR, MIP and VRT modes. The patient received 26mL OMNIPAQUE IOHEXOL 350 MG/ML SOLN of contrast. FINDINGS: Coronary calcium score is 77, which places the patient in the 84th percentile for age and sex matched control. Coronary arteries: Normal coronary origins.  Right dominance. Right Coronary Artery: No detectable plaque or stenosis. Left Main Coronary Artery: Minimal mixed atherosclerotic plaque in the mid left main, <25% stenosis. Left Anterior Descending Coronary Artery: Mild mixed atherosclerotic plaque in the mid LAD, 25-49% stenosis. The mid-distal LAD evaluation is significantly impacted by  stair step motion artifact. Evaluation in multiple phases of cardiac cycle, unable to definitely assess given proximity to calcifications. Distal vessel is patent without significant stenosis. Ramus intermedius: Proximal to mid vessel evaluation impacted by motion artifact, however no calcifications, no significant stenosis noted. Left Circumflex Artery: Diminutive circumflex artery. Aorta: Normal size, 33 mm at the mid ascending aorta (level of the PA bifurcation) measured double oblique. No calcifications. No dissection. Aortic Valve: No calcifications. Other findings: Normal pulmonary vein drainage into the left atrium. Normal left atrial appendage without a thrombus. Normal size of the pulmonary artery. IMPRESSION: 1. Mild CAD, CADRADS = 2. 2. Coronary calcium score is 77, which places the patient in the 84th percentile for age and sex matched control. 3. Normal coronary origin with right dominance. Electronically Signed   By: Cherlynn Kaiser   On: 08/14/2019 16:58   Result Date: 08/14/2019 EXAM: OVER-READ INTERPRETATION  CT CHEST The following report is an over-read performed by radiologist Dr. Suzy Bouchard of [facility], [place] on 08/14/2019. This over-read does not include interpretation of cardiac or coronary anatomy or pathology. The coronary calcium score/coronary CTA interpretation by the cardiologist is attached. COMPARISON:  Chest CT 08/13/2019 FINDINGS: Limited view of the lung parenchyma demonstrates no suspicious nodularity. Airways are normal. Limited view of the mediastinum demonstrates no adenopathy. Esophagus normal. Limited view of the upper abdomen unremarkable. Limited view of the skeleton and chest wall is unremarkable. IMPRESSION: No significant extracardiac findings. Electronically Signed: By: Suzy Bouchard M.D. On: 08/14/2019 12:11   ECHOCARDIOGRAM COMPLETE  Result Date: 08/13/2019    ECHOCARDIOGRAM REPORT   Patient Name:   [name] Date of Exam: 08/13/2019 Medical Rec #:   [id number]     Height:       66.0 in Accession #:    [id number]    Weight:       136.0 lb Date of Birth:  03/26/1955     BSA:  1.697 m Patient Age:    6 years      BP:           147/83 mmHg Patient Gender: F             HR:           74 bpm. Exam Location:  Inpatient Procedure: 2D Echo, Cardiac Doppler and Color Doppler Indications:    Dyspnea  History:        Patient has no prior history of Echocardiogram examinations.                 Signs/Symptoms:Dyspnea; Risk Factors:Dyslipidemia. Dyspnea on                 exertion.  Sonographer:    Clayton Lefort RDCS (AE) Referring Phys: [place]  1. Normal Echo.  2. Left ventricular ejection fraction, by estimation, is 60 to 65%. The left ventricle has normal function. The left ventricle has no regional wall motion abnormalities. Left ventricular diastolic parameters were normal.  3. Right ventricular systolic function is normal. The right ventricular size is normal. There is normal pulmonary artery systolic pressure.  4. The mitral valve is normal in structure. No evidence of mitral valve regurgitation. No evidence of mitral stenosis.  5. The aortic valve is normal in structure. Aortic valve regurgitation is not visualized. No aortic stenosis is present. FINDINGS  Left Ventricle: Left ventricular ejection fraction, by estimation, is 60 to 65%. The left ventricle has normal function. The left ventricle has no regional wall motion abnormalities. The left ventricular internal cavity size was normal in size. There is  no left ventricular hypertrophy. Left ventricular diastolic parameters were normal. Right Ventricle: The right ventricular size is normal. No increase in right ventricular wall thickness. Right ventricular systolic function is normal. There is normal pulmonary artery systolic pressure. The tricuspid regurgitant velocity is 2.54 m/s, and  with an assumed right atrial pressure of 3 mmHg, the estimated right ventricular systolic pressure  is 75.1 mmHg. Left Atrium: Left atrial size was normal in size. Right Atrium: Right atrial size was normal in size. Pericardium: There is no evidence of pericardial effusion. Mitral Valve: The mitral valve is normal in structure. No evidence of mitral valve regurgitation. No evidence of mitral valve stenosis. MV peak gradient, 3.8 mmHg. The mean mitral valve gradient is 2.0 mmHg. Tricuspid Valve: The tricuspid valve is normal in structure. Tricuspid valve regurgitation is trivial. Aortic Valve: The aortic valve is normal in structure. Aortic valve regurgitation is not visualized. No aortic stenosis is present. Aortic valve mean gradient measures 5.0 mmHg. Aortic valve peak gradient measures 8.4 mmHg. Aortic valve area, by VTI measures 1.94 cm. Pulmonic Valve: The pulmonic valve was normal in structure. Pulmonic valve regurgitation is not visualized. Aorta: The aortic root and ascending aorta are structurally normal, with no evidence of dilitation. IAS/Shunts: The atrial septum is grossly normal. Additional Comments: Normal Echo.  LEFT VENTRICLE PLAX 2D LVIDd:         4.20 cm  Diastology LVIDs:         2.80 cm  LV e' lateral:   10.90 cm/s LV PW:         1.00 cm  LV E/e' lateral: 7.0 LV IVS:        0.90 cm  LV e' medial:    9.68 cm/s LVOT diam:     1.80 cm  LV E/e' medial:  7.8 LV SV:  58 LV SV Index:   34 LVOT Area:     2.54 cm  RIGHT VENTRICLE RV Basal diam:  3.40 cm RV S prime:     13.90 cm/s TAPSE (M-mode): 2.6 cm LEFT ATRIUM             Index       RIGHT ATRIUM           Index LA diam:        2.70 cm 1.59 cm/m  RA Area:     16.20 cm LA Vol (A2C):   43.3 ml 25.51 ml/m RA Volume:   42.30 ml  24.92 ml/m LA Vol (A4C):   28.7 ml 16.91 ml/m LA Biplane Vol: 36.2 ml 21.33 ml/m  AORTIC VALVE AV Area (Vmax):    2.14 cm AV Area (Vmean):   1.69 cm AV Area (VTI):     1.94 cm AV Vmax:           145.00 cm/s AV Vmean:          103.000 cm/s AV VTI:            0.297 m AV Peak Grad:      8.4 mmHg AV Mean Grad:       5.0 mmHg LVOT Vmax:         122.00 cm/s LVOT Vmean:        68.500 cm/s LVOT VTI:          0.226 m LVOT/AV VTI ratio: 0.76  AORTA Ao Root diam: 3.10 cm Ao Asc diam:  3.10 cm MITRAL VALVE               TRICUSPID VALVE MV Area (PHT): 2.91 cm    TR Peak grad:   25.8 mmHg MV Peak grad:  3.8 mmHg    TR Vmax:        254.00 cm/s MV Mean grad:  2.0 mmHg MV Vmax:       0.97 m/s    SHUNTS MV Vmean:      59.5 cm/s   Systemic VTI:  0.23 m MV Decel Time: 261 msec    Systemic Diam: 1.80 cm MV E velocity: 75.80 cm/s MV A velocity: 67.30 cm/s MV E/A ratio:  1.13 Mertie Moores MD Electronically signed by Mertie Moores MD Signature Date/Time: 08/13/2019/5:55:47 PM    Final    Disposition   Pt is being discharged home today in good condition.  Follow-up Plans & Appointments   Do not overuse cath site,  If any bleeding hold pressure, it is like an IV was there should stop bleeding easily.  Heart Healthy Diet.    Call if increase of symptoms.  Pulmonary function test has been ordered, the office should call with date and time.  Call the office if you have not heard by Thursday.  Follow-up Information    Nahser, Wonda Cheng, MD Follow up on 09/28/2019.   Specialty: Cardiology Contact information: [address] [place] [address] [phone]                Discharge Medications   Allergies as of 08/15/2019      Reactions   Influenza Vaccines Nausea And Vomiting, Other (See Comments)   "Patient reported a severe allergic reaction to the influenza vaccine- several headaches, n/v      Medication List    STOP taking these medications   cefdinir 300 MG capsule Commonly known as: OMNICEF   moxifloxacin 0.5 % ophthalmic solution  Commonly known as: VIGAMOX   polyethylene glycol-electrolytes 420 g solution Commonly known as: NuLYTELY   prednisoLONE acetate 1 % ophthalmic suspension Commonly known as: PRED FORTE     TAKE these medications   alendronate 70 MG tablet Commonly known  as: FOSAMAX Take 70 mg by mouth every Monday. Take with a full glass of water on an empty stomach.   fluticasone 50 MCG/ACT nasal spray Commonly known as: FLONASE Place into both nostrils.   ibuprofen 200 MG tablet Commonly known as: ADVIL Take 200-600 mg by mouth every 6 (six) hours as needed for headache, mild pain or moderate pain.   rosuvastatin 20 MG tablet Commonly known as: CRESTOR Take 1 tablet (20 mg total) by mouth daily.   Vitamin D3 50 MCG (2000 UT) Tabs Take 2,000 Units by mouth daily.          Outstanding Labs/Studies   PFTs  Possible CPX testing.   Duration of Discharge Encounter   Greater than 30 minutes including physician time.  Signed, Cecilie Kicks, NP 08/15/2019, 2:13 PM

## 2019-08-15 NOTE — Progress Notes (Signed)
Pt discharged home with spouse, was taken to front of hospital via w/c to meet husband.

## 2019-08-15 NOTE — H&P (View-Only) (Signed)
Progress Note  Patient Name: [name] Date of Encounter: 08/15/2019  Primary Cardiologist: Mertie Moores, MD   Subjective   Nervous about RHC, discussed in detail. Eager to go home when workup complete.  Inpatient Medications    Scheduled Meds: . alendronate  70 mg Oral Q Mon  . cholecalciferol  2,000 Units Oral Daily  . enoxaparin (LOVENOX) injection  40 mg Subcutaneous Q24H  . rosuvastatin  20 mg Oral Daily  . sodium chloride flush  3 mL Intravenous Q12H   Continuous Infusions: . sodium chloride    . sodium chloride 10 mL/hr at 08/15/19 0607   PRN Meds: sodium chloride, acetaminophen, iohexol, nitroGLYCERIN, ondansetron (ZOFRAN) IV, sodium chloride flush   Vital Signs    Vitals:   08/15/19 0300 08/15/19 0518 08/15/19 0522 08/15/19 0607  BP:   133/67   Pulse: 74 78 81   Resp: 18 18 11    Temp:   98 F (36.7 C)   TempSrc:   Oral   SpO2: 95% 95% 99%   Weight:    59.1 kg  Height:        Intake/Output Summary (Last 24 hours) at 08/15/2019 0851 Last data filed at 08/14/2019 1928 Gross per 24 hour  Intake 240 ml  Output --  Net 240 ml   Filed Weights   08/13/19 1700 08/15/19 0607  Weight: 63.4 kg 59.1 kg    Telemetry    SR, rate 72 - Personally Reviewed  ECG    SR 83 - Personally Reviewed  Physical Exam   GEN: No acute distress.   Neck: No JVD Cardiac: RRR, no murmurs, rubs, or gallops.  Respiratory: Clear to auscultation bilaterally. GI: Soft, nontender, non-distended  MS: No edema; No deformity. Neuro:  Nonfocal  Psych: Normal affect     Labs    Chemistry Recent Labs  Lab 08/13/19 0149 08/13/19 1816 08/14/19 0540  NA 138  --  138  K 3.6  --  4.2  CL 106  --  110  CO2 21*  --  19*  GLUCOSE 106*  --  101*  BUN 17  --  20  CREATININE 0.78 1.22* 0.78  CALCIUM 9.7  --  8.5*  GFRNONAA >60 47* >60  GFRAA >60 55* >60  ANIONGAP 11  --  9     Hematology Recent Labs  Lab 08/13/19 0149 08/13/19 1816  WBC 4.9 4.6  RBC 4.90 4.64   HGB 14.4 13.5  HCT 42.5 40.8  MCV 86.7 87.9  MCH 29.4 29.1  MCHC 33.9 33.1  RDW 11.7 11.9  PLT 354 329    Cardiac EnzymesNo results for input(s): TROPONINI in the last 168 hours. No results for input(s): TROPIPOC in the last 168 hours.   BNPNo results for input(s): BNP, PROBNP in the last 168 hours.   DDimer No results for input(s): DDIMER in the last 168 hours.   Radiology    CT Angio Chest PE W/Cm &/Or Wo Cm  Result Date: 08/13/2019 CLINICAL DATA:  Chest pain, shortness of breath EXAM: CT ANGIOGRAPHY CHEST WITH CONTRAST TECHNIQUE: Multidetector CT imaging of the chest was performed using the standard protocol during bolus administration of intravenous contrast. Multiplanar CT image reconstructions and MIPs were obtained to evaluate the vascular anatomy. CONTRAST:  97mL OMNIPAQUE IOHEXOL 350 MG/ML SOLN COMPARISON:  Thyroid ultrasound, 12/11/2016, ultrasound-guided FNA, 01/07/2017 FINDINGS: Cardiovascular: Satisfactory opacification of the pulmonary arteries to the segmental level. No evidence of pulmonary embolism. Normal heart size. Left coronary artery  calcifications. No pericardial effusion. Mediastinum/Nodes: No enlarged mediastinal, hilar, or axillary lymph nodes. Nodule of the right lobe of the thyroid measuring 1.2 cm, previously assessed by thyroid ultrasound and biopsy. Trachea, and esophagus demonstrate no significant findings. Lungs/Pleura: Lungs are clear. No pleural effusion or pneumothorax. Upper Abdomen: No acute abnormality. Musculoskeletal: No chest wall abnormality. No acute or significant osseous findings. Review of the MIP images confirms the above findings. IMPRESSION: 1.  Negative examination for pulmonary embolism. 2.  Coronary artery disease. 3. Right thyroid nodule, previously biopsied. No imaging follow-up required. (Ref: J Am Coll Radiol. 2015 Feb;12(2): 143-50). Electronically Signed   By: Eddie Candle M.D.   On: 08/13/2019 10:27   CT CORONARY MORPH W/CTA COR  W/SCORE W/CA W/CM &/OR WO/CM  Addendum Date: 08/14/2019   ADDENDUM REPORT: 08/14/2019 16:58 HISTORY: Chest pain/anginal equiv, ECGs and troponins normal Dyspnea on exertion (DOE) EXAM: Cardiac/Coronary  CT TECHNIQUE: The patient was scanned on a [employer]. PROTOCOL: A 120 kV prospective scan was triggered in the descending thoracic aorta at 111 HU's. Axial non-contrast 3 mm slices were carried out through the heart. The data set was analyzed on a dedicated work station and scored using the [place]. Gantry rotation speed was 250 msecs and collimation was .6 mm. Beta blockade and 0.8 mg of sl NTG was given. The 3D data set was reconstructed in 5% intervals of the 67-82 % of the R-R cycle. Diastolic phases were analyzed on a dedicated work station using MPR, MIP and VRT modes. The patient received 76mL OMNIPAQUE IOHEXOL 350 MG/ML SOLN of contrast. FINDINGS: Coronary calcium score is 77, which places the patient in the 84th percentile for age and sex matched control. Coronary arteries: Normal coronary origins.  Right dominance. Right Coronary Artery: No detectable plaque or stenosis. Left Main Coronary Artery: Minimal mixed atherosclerotic plaque in the mid left main, <25% stenosis. Left Anterior Descending Coronary Artery: Mild mixed atherosclerotic plaque in the mid LAD, 25-49% stenosis. The mid-distal LAD evaluation is significantly impacted by stair step motion artifact. Evaluation in multiple phases of cardiac cycle, unable to definitely assess given proximity to calcifications. Distal vessel is patent without significant stenosis. Ramus intermedius: Proximal to mid vessel evaluation impacted by motion artifact, however no calcifications, no significant stenosis noted. Left Circumflex Artery: Diminutive circumflex artery. Aorta: Normal size, 33 mm at the mid ascending aorta (level of the PA bifurcation) measured double oblique. No calcifications. No dissection. Aortic Valve: No calcifications.  Other findings: Normal pulmonary vein drainage into the left atrium. Normal left atrial appendage without a thrombus. Normal size of the pulmonary artery. IMPRESSION: 1. Mild CAD, CADRADS = 2. 2. Coronary calcium score is 77, which places the patient in the 84th percentile for age and sex matched control. 3. Normal coronary origin with right dominance. Electronically Signed   By: Cherlynn Kaiser   On: 08/14/2019 16:58   Result Date: 08/14/2019 EXAM: OVER-READ INTERPRETATION  CT CHEST The following report is an over-read performed by radiologist Dr. Suzy Bouchard of [facility], [place] on 08/14/2019. This over-read does not include interpretation of cardiac or coronary anatomy or pathology. The coronary calcium score/coronary CTA interpretation by the cardiologist is attached. COMPARISON:  Chest CT 08/13/2019 FINDINGS: Limited view of the lung parenchyma demonstrates no suspicious nodularity. Airways are normal. Limited view of the mediastinum demonstrates no adenopathy. Esophagus normal. Limited view of the upper abdomen unremarkable. Limited view of the skeleton and chest wall is unremarkable. IMPRESSION: No significant extracardiac findings. Electronically Signed:  By: Suzy Bouchard M.D. On: 08/14/2019 12:11   ECHOCARDIOGRAM COMPLETE  Result Date: 08/13/2019    ECHOCARDIOGRAM REPORT   Patient Name:   AUGUSTINA BRADDOCK Date of Exam: 08/13/2019 Medical Rec #:  [id number]     Height:       66.0 in Accession #:    [id number]    Weight:       136.0 lb Date of Birth:  [birth year]     BSA:          1.697 m Patient Age:    38 years      BP:           147/83 mmHg Patient Gender: F             HR:           74 bpm. Exam Location:  Inpatient Procedure: 2D Echo, Cardiac Doppler and Color Doppler Indications:    Dyspnea  History:        Patient has no prior history of Echocardiogram examinations.                 Signs/Symptoms:Dyspnea; Risk Factors:Dyslipidemia. Dyspnea on                 exertion.  Sonographer:    Clayton Lefort RDCS (AE) Referring Phys: [place]  1. Normal Echo.  2. Left ventricular ejection fraction, by estimation, is 60 to 65%. The left ventricle has normal function. The left ventricle has no regional wall motion abnormalities. Left ventricular diastolic parameters were normal.  3. Right ventricular systolic function is normal. The right ventricular size is normal. There is normal pulmonary artery systolic pressure.  4. The mitral valve is normal in structure. No evidence of mitral valve regurgitation. No evidence of mitral stenosis.  5. The aortic valve is normal in structure. Aortic valve regurgitation is not visualized. No aortic stenosis is present. FINDINGS  Left Ventricle: Left ventricular ejection fraction, by estimation, is 60 to 65%. The left ventricle has normal function. The left ventricle has no regional wall motion abnormalities. The left ventricular internal cavity size was normal in size. There is  no left ventricular hypertrophy. Left ventricular diastolic parameters were normal. Right Ventricle: The right ventricular size is normal. No increase in right ventricular wall thickness. Right ventricular systolic function is normal. There is normal pulmonary artery systolic pressure. The tricuspid regurgitant velocity is 2.54 m/s, and  with an assumed right atrial pressure of 3 mmHg, the estimated right ventricular systolic pressure is 36.6 mmHg. Left Atrium: Left atrial size was normal in size. Right Atrium: Right atrial size was normal in size. Pericardium: There is no evidence of pericardial effusion. Mitral Valve: The mitral valve is normal in structure. No evidence of mitral valve regurgitation. No evidence of mitral valve stenosis. MV peak gradient, 3.8 mmHg. The mean mitral valve gradient is 2.0 mmHg. Tricuspid Valve: The tricuspid valve is normal in structure. Tricuspid valve regurgitation is trivial. Aortic Valve: The aortic valve is normal in structure. Aortic valve  regurgitation is not visualized. No aortic stenosis is present. Aortic valve mean gradient measures 5.0 mmHg. Aortic valve peak gradient measures 8.4 mmHg. Aortic valve area, by VTI measures 1.94 cm. Pulmonic Valve: The pulmonic valve was normal in structure. Pulmonic valve regurgitation is not visualized. Aorta: The aortic root and ascending aorta are structurally normal, with no evidence of dilitation. IAS/Shunts: The atrial septum is grossly normal. Additional Comments: Normal Echo.  LEFT VENTRICLE PLAX  2D LVIDd:         4.20 cm  Diastology LVIDs:         2.80 cm  LV e' lateral:   10.90 cm/s LV PW:         1.00 cm  LV E/e' lateral: 7.0 LV IVS:        0.90 cm  LV e' medial:    9.68 cm/s LVOT diam:     1.80 cm  LV E/e' medial:  7.8 LV SV:         58 LV SV Index:   34 LVOT Area:     2.54 cm  RIGHT VENTRICLE RV Basal diam:  3.40 cm RV S prime:     13.90 cm/s TAPSE (M-mode): 2.6 cm LEFT ATRIUM             Index       RIGHT ATRIUM           Index LA diam:        2.70 cm 1.59 cm/m  RA Area:     16.20 cm LA Vol (A2C):   43.3 ml 25.51 ml/m RA Volume:   42.30 ml  24.92 ml/m LA Vol (A4C):   28.7 ml 16.91 ml/m LA Biplane Vol: 36.2 ml 21.33 ml/m  AORTIC VALVE AV Area (Vmax):    2.14 cm AV Area (Vmean):   1.69 cm AV Area (VTI):     1.94 cm AV Vmax:           145.00 cm/s AV Vmean:          103.000 cm/s AV VTI:            0.297 m AV Peak Grad:      8.4 mmHg AV Mean Grad:      5.0 mmHg LVOT Vmax:         122.00 cm/s LVOT Vmean:        68.500 cm/s LVOT VTI:          0.226 m LVOT/AV VTI ratio: 0.76  AORTA Ao Root diam: 3.10 cm Ao Asc diam:  3.10 cm MITRAL VALVE               TRICUSPID VALVE MV Area (PHT): 2.91 cm    TR Peak grad:   25.8 mmHg MV Peak grad:  3.8 mmHg    TR Vmax:        254.00 cm/s MV Mean grad:  2.0 mmHg MV Vmax:       0.97 m/s    SHUNTS MV Vmean:      59.5 cm/s   Systemic VTI:  0.23 m MV Decel Time: 261 msec    Systemic Diam: 1.80 cm MV E velocity: 75.80 cm/s MV A velocity: 67.30 cm/s MV E/A ratio:  1.13  Mertie Moores MD Electronically signed by Mertie Moores MD Signature Date/Time: 08/13/2019/5:55:47 PM    Final     Cardiac Studies   Echo wnl. Mild disease on CCTA in LAD.   Patient Profile     LYLIANNA FRAISER is a 64 y.o. female with history of hyperlipidemia.  She is admitted to the hospital with profound shortness of breath with exertion.  Assessment & Plan   Active Problems:   Dyspnea   Dyspnea on exertion   CCTA nonobstructive. Will plan for RHC - informed consent obtained. If RHC demonstrates no shunt, would attribute to viral illness with possible reactive airway component.  INFORMED CONSENT: I have reviewed the risks, indications, and  alternatives to cardiac catheterization with the patient. Risks include but are not limited to bleeding, infection, vascular injury, stroke, myocardial infection, arrhythmia, kidney injury, radiation-related injury in the case of prolonged fluoroscopy use, emergency cardiac surgery, and death. The patient understands the risks of serious complication is 1-2 in 1252 with diagnostic cardiac cath.

## 2019-08-15 NOTE — Interval H&P Note (Signed)
History and Physical Interval Note:  08/15/2019 12:25 PM  [name]  has presented today for surgery, with the diagnosis of shortness of breath.  The various methods of treatment have been discussed with the patient and family. After consideration of risks, benefits and other options for treatment, the patient has consented to  Procedure(s): RIGHT HEART CATH (N/A) as a surgical intervention.  The patient's history has been reviewed, patient examined, no change in status, stable for surgery.  I have reviewed the patient's chart and labs.  Questions were answered to the patient's satisfaction.     Adeola Dennen

## 2019-08-15 NOTE — Discharge Instructions (Signed)
Do not overuse cath site,  If any bleeding hold pressure, it is like an IV was there should stop bleeding easily.  No lifting over 5 pounds for 24 hours.    Heart Healthy Diet.    Call if increase of symptoms.  Pulmonary function test has been ordered, the office should call with date and time.  Call the office if you have not heard by Thursday.

## 2019-08-16 ENCOUNTER — Encounter (HOSPITAL_COMMUNITY): Payer: BC Managed Care – PPO

## 2019-08-16 ENCOUNTER — Telehealth: Payer: Self-pay | Admitting: *Deleted

## 2019-08-16 MED FILL — Heparin Sod (Porcine)-NaCl IV Soln 1000 Unit/500ML-0.9%: INTRAVENOUS | Qty: 500 | Status: AC

## 2019-08-16 NOTE — Telephone Encounter (Addendum)
[facility] tried reaching out to the pt in regards to scheduled PFTs today 8/11 at 0900.  Pt did not answer for [facility].  Triage to await for the pt to return a call back.

## 2019-08-16 NOTE — Telephone Encounter (Signed)
-----   Message from Loren Racer, RN sent at 08/16/2019  8:43 AM EDT -----  ----- Message ----- From: Armando Gang Sent: 08/16/2019   8:40 AM EDT To: Sondra Come, RN  I have reach out to the patient and not answer.  She is on the schedule for today.  ----- Message ----- From: Loren Racer, RN Sent: 08/16/2019   8:08 AM EDT To: Armando Gang   ----- Message ----- From: Isaiah Serge, NP Sent: 08/16/2019   7:58 AM EDT To: Loren Racer, RN  I discharged her yesterday - that is why I sent message.  So she needs outpt ----- Message ----- From: Loren Racer, RN Sent: 08/15/2019   2:29 PM EDT To: Isaiah Serge, NP   ----- Message ----- From: Armando Gang Sent: 08/15/2019   2:22 PM EDT To: Loren Racer, RN  Patient is in the hospital/ the test has been schedule for  8-11 @ [facility]/ our order can be cancel.  ----- Message ----- From: Loren Racer, RN Sent: 08/15/2019   1:56 PM EDT To: [facility]   ----- Message ----- From: Isaiah Serge, NP Sent: 08/15/2019   1:54 PM EDT To: [facility] Scheduling, [facility] Triage  Hi I placed order for PFTs for DOE please schedule and notify pt thank you

## 2019-08-18 DIAGNOSIS — Z1152 Encounter for screening for COVID-19: Secondary | ICD-10-CM | POA: Diagnosis not present

## 2019-08-18 DIAGNOSIS — H6981 Other specified disorders of Eustachian tube, right ear: Secondary | ICD-10-CM | POA: Diagnosis not present

## 2019-08-18 NOTE — Telephone Encounter (Signed)
PFTs are scheduled for 8/23.  Note on appointment indicates patient is aware

## 2019-08-28 ENCOUNTER — Other Ambulatory Visit: Payer: Self-pay

## 2019-08-28 ENCOUNTER — Ambulatory Visit (HOSPITAL_COMMUNITY)
Admission: RE | Admit: 2019-08-28 | Discharge: 2019-08-28 | Disposition: A | Discharge status: Home / Self Care | Payer: BC Managed Care – PPO | Source: Ambulatory Visit | Attending: Cardiovascular Disease | Admitting: Cardiovascular Disease

## 2019-08-28 DIAGNOSIS — R0600 Dyspnea, unspecified: Secondary | ICD-10-CM | POA: Insufficient documentation

## 2019-08-28 DIAGNOSIS — Z1152 Encounter for screening for COVID-19: Secondary | ICD-10-CM | POA: Diagnosis not present

## 2019-08-28 LAB — PULMONARY FUNCTION TEST
DL/VA % pred: 81 %
DL/VA: 3.36 ml/min/mmHg/L
DLCO cor % pred: 76 %
DLCO cor: 16.31 ml/min/mmHg
DLCO unc % pred: 76 %
DLCO unc: 16.36 ml/min/mmHg
FEF 25-75 Post: 2.98 L/sec
FEF 25-75 Pre: 2.82 L/sec
FEF2575-%Change-Post: 5 %
FEF2575-%Pred-Post: 125 %
FEF2575-%Pred-Pre: 118 %
FEV1-%Change-Post: 0 %
FEV1-%Pred-Post: 104 %
FEV1-%Pred-Pre: 103 %
FEV1-Post: 2.82 L
FEV1-Pre: 2.79 L
FEV1FVC-%Change-Post: 4 %
FEV1FVC-%Pred-Pre: 102 %
FEV6-%Change-Post: -3 %
FEV6-%Pred-Post: 99 %
FEV6-%Pred-Pre: 103 %
FEV6-Post: 3.36 L
FEV6-Pre: 3.49 L
FEV6FVC-%Change-Post: 0 %
FEV6FVC-%Pred-Post: 102 %
FEV6FVC-%Pred-Pre: 103 %
FVC-%Change-Post: -3 %
FVC-%Pred-Post: 96 %
FVC-%Pred-Pre: 100 %
FVC-Post: 3.39 L
FVC-Pre: 3.51 L
Post FEV1/FVC ratio: 83 %
Post FEV6/FVC ratio: 99 %
Pre FEV1/FVC ratio: 79 %
Pre FEV6/FVC Ratio: 99 %
RV % pred: 101 %
RV: 2.16 L
TLC % pred: 104 %
TLC: 5.61 L

## 2019-08-28 MED ORDER — ALBUTEROL SULFATE (2.5 MG/3ML) 0.083% IN NEBU
2.5000 mg | INHALATION_SOLUTION | Freq: Once | RESPIRATORY_TRACT | Status: AC
  Administered 2019-08-28: 2.5 mg via RESPIRATORY_TRACT

## 2019-09-01 DIAGNOSIS — L570 Actinic keratosis: Secondary | ICD-10-CM | POA: Diagnosis not present

## 2019-09-01 DIAGNOSIS — D2271 Melanocytic nevi of right lower limb, including hip: Secondary | ICD-10-CM | POA: Diagnosis not present

## 2019-09-01 DIAGNOSIS — L700 Acne vulgaris: Secondary | ICD-10-CM | POA: Diagnosis not present

## 2019-09-01 DIAGNOSIS — D2272 Melanocytic nevi of left lower limb, including hip: Secondary | ICD-10-CM | POA: Diagnosis not present

## 2019-09-01 DIAGNOSIS — L819 Disorder of pigmentation, unspecified: Secondary | ICD-10-CM | POA: Diagnosis not present

## 2019-09-19 ENCOUNTER — Ambulatory Visit: Payer: BC Managed Care – PPO | Admitting: Pulmonary Disease

## 2019-09-19 ENCOUNTER — Telehealth: Payer: Self-pay | Admitting: Pulmonary Disease

## 2019-09-19 ENCOUNTER — Other Ambulatory Visit: Payer: Self-pay

## 2019-09-19 ENCOUNTER — Encounter: Payer: Self-pay | Admitting: Pulmonary Disease

## 2019-09-19 VITALS — BP 120/70 | HR 87 | Temp 98.2°F | Ht 66.0 in | Wt 132.6 lb

## 2019-09-19 DIAGNOSIS — R0602 Shortness of breath: Secondary | ICD-10-CM

## 2019-09-19 LAB — CBC WITH DIFFERENTIAL/PLATELET
Basophils Absolute: 0 10*3/uL (ref 0.0–0.1)
Basophils Relative: 0.6 % (ref 0.0–3.0)
Eosinophils Absolute: 0.1 10*3/uL (ref 0.0–0.7)
Eosinophils Relative: 2.9 % (ref 0.0–5.0)
HCT: 40.1 % (ref 36.0–46.0)
Hemoglobin: 13.5 g/dL (ref 12.0–15.0)
Lymphocytes Relative: 32.9 % (ref 12.0–46.0)
Lymphs Abs: 1.3 10*3/uL (ref 0.7–4.0)
MCHC: 33.6 g/dL (ref 30.0–36.0)
MCV: 87 fl (ref 78.0–100.0)
Monocytes Absolute: 0.6 10*3/uL (ref 0.1–1.0)
Monocytes Relative: 14 % — ABNORMAL HIGH (ref 3.0–12.0)
Neutro Abs: 2 10*3/uL (ref 1.4–7.7)
Neutrophils Relative %: 49.6 % (ref 43.0–77.0)
Platelets: 339 10*3/uL (ref 150.0–400.0)
RBC: 4.61 Mil/uL (ref 3.87–5.11)
RDW: 12.9 % (ref 11.5–15.5)
WBC: 4 10*3/uL (ref 4.0–10.5)

## 2019-09-19 MED ORDER — BUDESONIDE-FORMOTEROL FUMARATE 160-4.5 MCG/ACT IN AERO: 2.0000 | INHALATION_SPRAY | Freq: Two times a day (BID) | RESPIRATORY_TRACT | 6 refills | Status: DC

## 2019-09-19 NOTE — Progress Notes (Signed)
[name]    [id number]    11-Aug-1955  Primary Care Physician:[name], MD  Referring Physician: Reginold Agent, NP [address] [place],  [place] [address]  Chief complaint: Consult for dyspnea  HPI: 64 year old with history of hyperlipidemia, GERD Complains of severe dyspnea with onset in July 2021.  She had an episode of bacterial conjunctivitis treated with prednisone eyedrops catheters and a trip to [place] She subsequently developed throat congestion, nasal discharge, postnasal drip intermittent shortness of breath. Report shortness of breath while talking and with minimal exertion.  Tested negative multiple times for Covid.  She was hospitalized in August for evaluation with negative right heart catheterization with no pulmonary hypertension or intracardiac shunt, negative CTA with no pulmonary embolism or lung abnormalities.  She had a CT coronaries with no significant coronary disease and has been referred here for further evaluation   Pets: Had a dog who died recently in Mar 01, 2019 Occupation: Retired.  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  No mold, hot tub, Jacuzzi Smoking history: Originally from [place].  Moved to [place] around 02/28/98 Travel history: Trip to [place] earlier in 03-01-19. Relevant family history: No significant family history of lung disease   Outpatient Encounter Medications as of 09/19/2019  Medication Sig  . alendronate (FOSAMAX) 70 MG tablet Take 70 mg by mouth every Monday. Take with a full glass of water on an empty stomach.   . Cholecalciferol (VITAMIN D3) 50 MCG (2000 UT) TABS Take 2,000 Units by mouth daily.  . fluticasone (FLONASE) 50 MCG/ACT nasal spray Place into both nostrils.   Marland Kitchen ibuprofen (ADVIL,MOTRIN) 200 MG tablet Take 200-600 mg by mouth every 6 (six) hours as needed for headache, mild pain or moderate pain.   . rosuvastatin (CRESTOR) 20 MG tablet Take 1 tablet (20 mg  total) by mouth daily.   No facility-administered encounter medications on file as of 09/19/2019.    Allergies as of 09/19/2019 - Review Complete 09/19/2019  Allergen Reaction Noted  . Influenza vaccines Nausea And Vomiting and Other (See Comments) 10/16/2012    Past Medical History:  Diagnosis Date  . Abdominal distention   . Burping    excessive burping   . Diarrhea   . GERD (gastroesophageal reflux disease)   . Hyperlipidemia   . 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 BLADDER SURGERY    . CHOLECYSTECTOMY  02/28/10   laparoscopic  . EUS  02/11/2011   Procedure: UPPER ENDOSCOPIC ULTRASOUND (EUS) LINEAR;  Surgeon: Landry Dyke, MD;  Location: [facility];  Service: Endoscopy;  Laterality: N/A;  mac  . RIGHT HEART CATH N/A 08/15/2019   Procedure: RIGHT HEART CATH;  Surgeon: Jolaine Artist, MD;  Location: [facility];  Service: Cardiovascular;  Laterality: N/A;  . US ECHOCARDIOGRAPHY  11/03/2006   EF 55-60%    Family History  Problem Relation Age of Onset  . Heart disease Mother   . Hypertension Mother   . Hypertension Father   . Cancer Paternal Grandmother        Gallbladder  . Colon cancer Neg Hx     Social History   Socioeconomic History  . Marital status: Married    Spouse name: Not on file  . Number of children: 3  . Years of education: Not on file  . Highest education level:  Not on file  Occupational History  . Occupation: Homemaker  Tobacco Use  . Smoking status: Never Smoker  . Smokeless tobacco: Never Used  Vaping Use  . Vaping Use: Never used  Substance and Sexual Activity  . Alcohol use: No  . Drug use: No  . Sexual activity: Not on file  Other Topics Concern  . Not on file  Social History Narrative   0 caffeine drinks    Social Determinants of Health   Financial Resource Strain:   . Difficulty of Paying Living Expenses: Not on file  Food  Insecurity:   . Worried About Charity fundraiser in the Last Year: Not on file  . Ran Out of Food in the Last Year: Not on file  Transportation Needs:   . Lack of Transportation (Medical): Not on file  . Lack of Transportation (Non-Medical): Not on file  Physical Activity:   . Days of Exercise per Week: Not on file  . Minutes of Exercise per Session: Not on file  Stress:   . Feeling of Stress : Not on file  Social Connections:   . Frequency of Communication with Friends and Family: Not on file  . Frequency of Social Gatherings with Friends and Family: Not on file  . Attends Religious Services: Not on file  . Active Member of Clubs or Organizations: Not on file  . Attends Archivist Meetings: Not on file  . Marital Status: Not on file  Intimate Partner Violence:   . Fear of Current or Ex-Partner: Not on file  . Emotionally Abused: Not on file  . Physically Abused: Not on file  . Sexually Abused: Not on file    Review of systems: Review of Systems  Constitutional: Negative for fever and chills.  HENT: Negative.   Eyes: Negative for blurred vision.  Respiratory: as per HPI  Cardiovascular: Negative for chest pain and palpitations.  Gastrointestinal: Negative for vomiting, diarrhea, blood per rectum. Genitourinary: Negative for dysuria, urgency, frequency and hematuria.  Musculoskeletal: Negative for myalgias, back pain and joint pain.  Skin: Negative for itching and rash.  Neurological: Negative for dizziness, tremors, focal weakness, seizures and loss of consciousness.  Endo/Heme/Allergies: Negative for environmental allergies.  Psychiatric/Behavioral: Negative for depression, suicidal ideas and hallucinations.  All other systems reviewed and are negative.  Physical Exam: Blood pressure 120/70, pulse 87, temperature 98.2 F (36.8 C), temperature source Oral, height _0  (1.676 m), weight 132 lb 9.6 oz (60.1 kg), SpO2 99 %. Gen:      No acute distress HEENT:   EOMI, sclera anicteric Neck:     No masses; no thyromegaly Lungs:    Clear to auscultation bilaterally; normal respiratory effort CV:         Regular rate and rhythm; no murmurs Abd:      + bowel sounds; soft, non-tender; no palpable masses, no distension Ext:    No edema; adequate peripheral perfusion Skin:      Warm and dry; no rash Neuro: alert and oriented x 3 Psych: normal mood and affect  Data Reviewed: Imaging: CTA 08/13/2019-no PE, lungs are clear CT coronaries 08/14/2019-mild coronary artery disease, visualized lung fields are clear I have reviewed the images personally  PFTs: 08/28/2019 FVC 3.39 [96%], FEV1 2.82 (104%], F/F 83, TLC 5.61 [104%], DLCO 16.36 [76%] Minimal diffusion defect  Labs:  Cardiac: Right heart cath 08/15/2019- Normal RHC with no evidence of PAH, volume overload or intracardiac shunting.  PA = 20/5 (12) PCW =  7 Fick cardiac output/index =  6.5/3.8 PVR = 0.8 WU  Echocardiogram 08/13/2019 LVEF 60-65%, normal RV systolic function, normal PA systolic pressure  Assessment:  Evaluation for dyspnea Etiology is unclear with negative work-up as noted above.  She could have reactive airway disease from viral illness.  Continues to have postnasal drip and mucus sensation at the back of the throat Continue Flonase nasal spray.  Start chlorpheniramine over-the-counter antihistamine Check CBC differential, IgE Trial of Symbicort inhaler  If symptoms continue then we will get a cardiopulmonary exercise test for further evaluation of dyspnea.  Plan/Recommendations: Flonase, chlorpheniramine CBC differential, IgE Symbicort  Marshell Garfinkel MD [facility] 09/19/2019, 9:35 AM  CC: Reginold Agent, NP

## 2019-09-19 NOTE — Patient Instructions (Addendum)
Start chlorpheniramine 8 mg 3 times daily.  This medication is available over-the-counter. Watch out for side effects or sleepiness  We will check some labs including CBC with differential, IgE  Continue Flonase  We will start an inhaler called Symbicort 160 puffs twice daily  Continue to stay active and exercise as much as possible  If these do not improve your symptoms in 4 weeks then let us know and we will proceed with the cardiopulmonary exercise test  Follow-up in 3 months

## 2019-09-19 NOTE — Telephone Encounter (Signed)
Patient states Symbicort makes her shaky. Just took a dose of inhaler. Patient phone number is [phone].

## 2019-09-19 NOTE — Telephone Encounter (Signed)
Called and spoke with patient, states the Symbicort caused her to be shaky, but not bad.  Advised to let us know if the shakiness becomes bothersome, she stated she would.  She wanted to know if Dr. Vaughan Browner thought she had allergies and asthma.  I went back to his note and read from his notes regarding unclear etiology of dyspnea and that possibly the viral illness caused reactive airway.  Advised that she had blood drawn for Ige and this would give Dr. Vaughan Browner more information regarding allergies.  She stated that the Symbicort cost her $100 copay, advised I would send to our pharmacy team to see if her insurance has a preferred list.  She verbalized understanding.

## 2019-09-20 LAB — IGE: IgE (Immunoglobulin E), Serum: 29 kU/L (ref <=114)

## 2019-09-20 NOTE — Telephone Encounter (Signed)
Ran test claims for 1 month supply.  Advair diskus- $10.00 copay  Advair HFA, Symbicort, Dulera, and Breo- each have $100 copays. Generics for Symbicort and Advair are not covered.  Airduo is not covered

## 2019-09-21 NOTE — Telephone Encounter (Signed)
Called and provided the pharmacy information regarding inhalers and cost on her VM.  Left message that I would send this information for Dr. Vaughan Browner to see if he wants to order the Advair Diskus.  Advair diskus- $10.00 copay  Advair HFA, Symbicort, Dulera, and Breo- each have $100 copays. Generics for Symbicort and Advair are not covered.  Airduo is not covered  Dr. Vaughan Browner ,please advise if you want to change inhaler to [place] diskus d/t patient cost of Symbicort.  Thank you.

## 2019-09-22 DIAGNOSIS — Z6821 Body mass index (BMI) 21.0-21.9, adult: Secondary | ICD-10-CM | POA: Diagnosis not present

## 2019-09-22 DIAGNOSIS — Z13820 Encounter for screening for osteoporosis: Secondary | ICD-10-CM | POA: Diagnosis not present

## 2019-09-22 DIAGNOSIS — Z01419 Encounter for gynecological examination (general) (routine) without abnormal findings: Secondary | ICD-10-CM | POA: Diagnosis not present

## 2019-09-22 DIAGNOSIS — Z1231 Encounter for screening mammogram for malignant neoplasm of breast: Secondary | ICD-10-CM | POA: Diagnosis not present

## 2019-09-22 NOTE — Telephone Encounter (Signed)
Called and spoke with pt. Made her aware that Dr. Vaughan Browner was not in the office today but that we were going to send this to one of our other providers for them to review.  Pt saw Dr. Vaughan Browner for a consult 09/19/19. Assessment:  Evaluation for dyspnea Etiology is unclear with negative work-up as noted above.  She could have reactive airway disease from viral illness.  Continues to have postnasal drip and mucus sensation at the back of the throat Continue Flonase nasal spray.  Start chlorpheniramine over-the-counter antihistamine Check CBC differential, IgE Trial of Symbicort inhaler  If symptoms continue then we will get a cardiopulmonary exercise test for further evaluation of dyspnea.  Plan/Recommendations: Flonase, chlorpheniramine CBC differential, IgE Symbicort   Pt stated that she did begin the antihistamine as well as the Symbicort. After beginning the Symbicort, she has been feeling very shaky/jittery and also states that her breathing has worsened. Pt said that her heart rate has also been more elevated ranging in the 90s since.  Pt did not take the Symbicort today 9/17 due to how she has been feeling.  Pt is wanting to know if there is anything else that could be prescribed in its place as she does not feel comfortable taking the Symbicort anymore due to how it is making her feel.  Sending this to APP of the day for them to review.  Aaron Edelman, please advise.

## 2019-09-22 NOTE — Telephone Encounter (Signed)
09/22/2019  Patient could consider decreasing Symbicort 160 to Symbicort 80.  We can send in this prescription.  This would be a lower dose.  We can see if she could tolerate this.  If she is willing ok to send in:    Symbicort 80 >>> 2 puffs in the morning right when you wake up, rinse out your mouth after use, 12 hours later 2 puffs, rinse after use >>> Take this daily, no matter what >>> This is not a rescue inhaler    If not she can trial coming off of the inhaler completely and see how she does or if the symptoms improve.  Would recommend that she is scheduled for follow-up with Dr. Vaughan Browner in 4 to 6 weeks to further review.  Wyn Quaker, FNP

## 2019-09-22 NOTE — Telephone Encounter (Signed)
Spoke with the pt  She was advised of Brian's recommendations  She states prefers to completely try off of inhaler  She did not have time to make appt right now and states will have to call back

## 2019-09-22 NOTE — Telephone Encounter (Signed)
Pt calling back to state that since starting the Symbicort that her resting heart rate is in the 90's and concerned that may be a good thing. Pt can be reached at [phone].

## 2019-09-25 ENCOUNTER — Telehealth: Payer: Self-pay | Admitting: Pulmonary Disease

## 2019-09-25 NOTE — Telephone Encounter (Signed)
Spoke with patient regarding prior message. Labs are normal  OK with stopping the Symbicort and nasal spray.  As per our discussion in office we can evaluate the dyspnea further with a cardiopulmonary exercise test. If she prefers to discuss with me then I can either give her a call or schedule an office visit.   Patient stated she will call our office to make a f/u when patient come back into town. Patient's voice was understanding. Nothing else further needed.

## 2019-09-25 NOTE — Telephone Encounter (Signed)
Labs are normal  OK with stopping the Symbicort and nasal spray.  As per our discussion in office we can evaluate the dyspnea further with a cardiopulmonary exercise test. If she prefers to discuss with me then I can either give her a call or schedule an office visit.   Marshell Garfinkel MD [facility] 09/25/2019, 1:42 PM

## 2019-09-25 NOTE — Telephone Encounter (Signed)
Spoke with the pt  She is asking for results of labs  She wanted to let Dr Vaughan Browner know that she has stopped her symbicort and nasal spray b/c they were not helping

## 2019-09-27 ENCOUNTER — Encounter: Payer: Self-pay | Admitting: Cardiovascular Disease

## 2019-09-27 DIAGNOSIS — H1045 Other chronic allergic conjunctivitis: Secondary | ICD-10-CM | POA: Diagnosis not present

## 2019-09-27 NOTE — Progress Notes (Signed)
Cardiology Office Note   Date:  09/28/2019   ID:  [name], DOB 10/23/1955, MRN [id number]  PCP:  [name], MD  Cardiologist:   Mertie Moores, MD   Chief Complaint  Patient presents with  . Hyperlipidemia  . Shortness of Breath   Problem List: 1. Hypercholesterolemia 2. Family Hx of CAD  He's been quite active. She's back playing tennis and is doing well. She denies any chest pain or shortness breath. She works out on a regular basis and is able to achieve a peak heart rate 185 160 without too much difficulty. She works out for 30 minutes at a time doing these exercises. She's also walking in the evenings with her husband.   March 23, 2013:  [name] is doing OK. She has stopped her Atrovastatin. - she ran out and could not get back into see me.  Her LDL was 181 this last time. Her previous LDL was 108. She admits that her diet has not been as good and she has not been working out as much.   March 23, 2014:  [name] is a 64 y.o. female who presents for follow up of her hyperlipidemia.    She's done very well. She's exercising readily. She's lost a little bit of weight. She's not having any episodes of chest pain.  April 05, 2015:  [name] is seen today for follow up of her hyperlipdiemia. Staying active - golf and tennis regularly .  Walks regularly BP is up slighlty .  March 05, 2016:  Presents with stabbing like chest pain.  Quick jabs. At rest . Doesn't increase with activity. Has not been exercising as much.   Walking does not increase pain .   Not related to eating.  Not related to taking a deep breath.   Has gained some weight ( 10 lbs )  Has not been taking the atorvastatin as regularly  Has been present for 3 weeks .   Occurs almost daily .   May 04, 2017:  Doing well Working out regularly .  Feeling well  No chest pains or shortness of breath. Her last lipid levels were overall okay but her LDL is still 101.  I would like for  her LDL to be around 93 since she has a family here history of early coronary disease in her mother. She admits that she does not take her atorvastatin every day and thinks she misses doses  Most  weekends.  Oct. 9, 2020 [name] is seen today for follow of her CP and hyperlipidemia. Has been busy with doctors visit s Playing golf and tennis .  No cp.  Hikes occasionally  Tolerating the crestor well  Sept. 23, 2021:  [name] is seen today for follow up  She has had persistent dyspnea. She was hospitalized a month ago covid test was negative. Right heart cath was normal She had a negative CT angiogram of the chest.  No evidence of pulmonary embolus.  She was thought to have asthma , started on inhalers,  These caused her to have tachycardia - felt jittery,  did not feel any better  PFTs were normal   She thinks her dyspnea  may be all due to allergies.    She stopped all her pulmonary meds several days ago .  Still on rosuvastatin   We have suggested cardiopulmonary stress testing at some point .     Past Medical History:  Diagnosis Date  . Abdominal distention   .  Burping    excessive burping   . Diarrhea   . GERD (gastroesophageal reflux disease)   . Hyperlipidemia   . 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 BLADDER SURGERY    . CHOLECYSTECTOMY  2012   laparoscopic  . EUS  02/11/2011   Procedure: UPPER ENDOSCOPIC ULTRASOUND (EUS) LINEAR;  Surgeon: Landry Dyke, MD;  Location: [facility];  Service: Endoscopy;  Laterality: N/A;  mac  . RIGHT HEART CATH N/A 08/15/2019   Procedure: RIGHT HEART CATH;  Surgeon: Jolaine Artist, MD;  Location: [facility];  Service: Cardiovascular;  Laterality: N/A;  . US ECHOCARDIOGRAPHY  11/03/2006   EF 55-60%     Current Outpatient Medications  Medication Sig Dispense Refill  . alendronate (FOSAMAX) 70 MG tablet Take 70 mg by mouth  every Monday. Take with a full glass of water on an empty stomach.     . Cholecalciferol (VITAMIN D3) 50 MCG (2000 UT) TABS Take 2,000 Units by mouth daily.    Marland Kitchen ibuprofen (ADVIL,MOTRIN) 200 MG tablet Take 200-600 mg by mouth every 6 (six) hours as needed for headache, mild pain or moderate pain.     . rosuvastatin (CRESTOR) 20 MG tablet Take 1 tablet (20 mg total) by mouth daily. 90 tablet 3   No current facility-administered medications for this visit.    Allergies:   Influenza vaccines    Social History:  The patient  reports that she has never smoked. She has never used smokeless tobacco. She reports that she does not drink alcohol and does not use drugs.   Family History:  The patient's family history includes Cancer in her paternal grandmother; Heart disease in her mother; Hypertension in her father and mother.    ROS: Noted in current history, otherwise negative.    Physical Exam: Blood pressure 104/76, pulse 76, height _0  (1.676 m), weight 132 lb 6.4 oz (60.1 kg), SpO2 98 %.  GEN:  Well nourished, well developed in no acute distress HEENT: Normal NECK: No JVD; No carotid bruits LYMPHATICS: No lymphadenopathy CARDIAC: RRR , no murmurs, rubs, gallops RESPIRATORY:  Clear to auscultation without rales, wheezing or rhonchi  ABDOMEN: Soft, non-tender, non-distended MUSCULOSKELETAL:  No edema; No deformity  SKIN: Warm and dry NEUROLOGIC:  Alert and oriented x 3    EKG:     Recent Labs: 01/20/2019: ALT 13 08/14/2019: BUN 20; Creatinine, Ser 0.78 08/15/2019: Potassium 3.0; Sodium 144 09/19/2019: Hemoglobin 13.5; Platelets 339.0    Lipid Panel    Component Value Date/Time   CHOL 150 01/20/2019 1103   TRIG 74 01/20/2019 1103   HDL 62 01/20/2019 1103   CHOLHDL 2.4 01/20/2019 1103   CHOLHDL 2.7 04/04/2015 0905   VLDL 14 04/04/2015 0905   LDLCALC 74 01/20/2019 1103   LDLDIRECT 147.6 01/27/2011 0902      Wt Readings from Last 3 Encounters:  09/28/19 132 lb 6.4 oz  (60.1 kg)  09/19/19 132 lb 9.6 oz (60.1 kg)  08/15/19 130 lb 4.7 oz (59.1 kg)      Other studies Reviewed: Additional studies/ records that were reviewed today include: . Review of the above records demonstrates:    ASSESSMENT AND PLAN:  1. Dysnpea:    She continues to have DOE.   Cardiac status and pulmonary work ups are unremarkable .  We have considered doing a cardiopulmonary stress test but at this point I think  we should wait.  She is she is thinking that there may be an allergic component.  She is scheduled to see an allergist in the next several weeks.  She should be tested for environmental allergies as well as food allergies.   2.  Hyperlipidemia: her LDL is 99.  I will see her again in 3 months.  We will check lipids, liver enzymes, basic metabolic profile in 3 to 4 months.     Current medicines are reviewed at length with the patient today.  The patient does not have concerns regarding medicines.  The following changes have been made:  no change  Labs/ tests ordered today include:   Orders Placed This Encounter  Procedures  . Hepatic function panel  . Lipid panel  . Basic metabolic panel     Disposition:   3 months with lipids, liver enz, bmp    Signed, Mertie Moores, MD  09/28/2019 11:32 AM    [facility] [place], [place], [place]  [address] Phone: [phone]; Fax: [phone]

## 2019-09-28 ENCOUNTER — Ambulatory Visit: Payer: BC Managed Care – PPO | Admitting: Cardiovascular Disease

## 2019-09-28 ENCOUNTER — Other Ambulatory Visit: Payer: Self-pay

## 2019-09-28 ENCOUNTER — Encounter: Payer: Self-pay | Admitting: Cardiovascular Disease

## 2019-09-28 VITALS — BP 104/76 | HR 76 | Ht 66.0 in | Wt 132.4 lb

## 2019-09-28 DIAGNOSIS — E782 Mixed hyperlipidemia: Secondary | ICD-10-CM | POA: Diagnosis not present

## 2019-09-28 DIAGNOSIS — R0602 Shortness of breath: Secondary | ICD-10-CM

## 2019-09-28 NOTE — Patient Instructions (Signed)
Medication Instructions:  Your provider recommends that you continue on your current medications as directed. Please refer to the Current Medication list given to you today.   *If you need a refill on your cardiac medications before your next appointment, please call your pharmacy*  Lab Work: Your provider recommends that you return for FASTING lab work in 3-4 months. If you have labs (blood work) drawn today and your tests are completely normal, you will receive your results only by: Marland Kitchen MyChart Message (if you have MyChart) OR . A paper copy in the mail If you have any lab test that is abnormal or we need to change your treatment, we will call you to review the results.  Follow-Up: Your provider recommends that you schedule a follow-up appointment in 3-4 months.

## 2019-10-09 DIAGNOSIS — H1045 Other chronic allergic conjunctivitis: Secondary | ICD-10-CM | POA: Diagnosis not present

## 2019-10-11 ENCOUNTER — Institutional Professional Consult (permissible substitution): Payer: BC Managed Care – PPO | Admitting: Pulmonary Disease

## 2019-10-12 DIAGNOSIS — J301 Allergic rhinitis due to pollen: Secondary | ICD-10-CM | POA: Diagnosis not present

## 2019-10-12 DIAGNOSIS — K219 Gastro-esophageal reflux disease without esophagitis: Secondary | ICD-10-CM | POA: Diagnosis not present

## 2019-10-12 DIAGNOSIS — R0602 Shortness of breath: Secondary | ICD-10-CM | POA: Diagnosis not present

## 2019-10-13 DIAGNOSIS — M810 Age-related osteoporosis without current pathological fracture: Secondary | ICD-10-CM | POA: Diagnosis not present

## 2019-10-13 DIAGNOSIS — E785 Hyperlipidemia, unspecified: Secondary | ICD-10-CM | POA: Diagnosis not present

## 2019-10-13 DIAGNOSIS — E041 Nontoxic single thyroid nodule: Secondary | ICD-10-CM | POA: Diagnosis not present

## 2019-10-13 DIAGNOSIS — Z0000 Encounter for general adult medical examination without abnormal findings: Secondary | ICD-10-CM | POA: Diagnosis not present

## 2019-10-16 ENCOUNTER — Other Ambulatory Visit: Payer: Self-pay | Admitting: Internal Medicine

## 2019-10-16 DIAGNOSIS — R002 Palpitations: Secondary | ICD-10-CM

## 2019-10-16 DIAGNOSIS — R251 Tremor, unspecified: Secondary | ICD-10-CM | POA: Diagnosis not present

## 2019-10-16 DIAGNOSIS — E041 Nontoxic single thyroid nodule: Secondary | ICD-10-CM | POA: Diagnosis not present

## 2019-10-16 DIAGNOSIS — R946 Abnormal results of thyroid function studies: Secondary | ICD-10-CM

## 2019-10-16 DIAGNOSIS — F419 Anxiety disorder, unspecified: Secondary | ICD-10-CM

## 2019-10-16 DIAGNOSIS — Z0000 Encounter for general adult medical examination without abnormal findings: Secondary | ICD-10-CM | POA: Diagnosis not present

## 2019-10-17 ENCOUNTER — Other Ambulatory Visit: Payer: Self-pay

## 2019-10-17 DIAGNOSIS — H1033 Unspecified acute conjunctivitis, bilateral: Secondary | ICD-10-CM | POA: Diagnosis not present

## 2019-10-17 MED ORDER — PROPRANOLOL HCL 10 MG PO TABS: 10.0000 mg | ORAL_TABLET | Freq: Four times a day (QID) | ORAL | 11 refills | Status: DC

## 2019-10-17 NOTE — Progress Notes (Signed)
Dr. Acie Fredrickson called triage and prescribed propranolol 10 mg by mouth 4 times a day for patient to pick up at her pharmacy. Order has been made.

## 2019-10-23 ENCOUNTER — Ambulatory Visit
Admission: RE | Admit: 2019-10-23 | Discharge: 2019-10-23 | Disposition: A | Discharge status: Home / Self Care | Payer: BC Managed Care – PPO | Source: Ambulatory Visit | Attending: Internal Medicine | Admitting: Internal Medicine

## 2019-10-23 DIAGNOSIS — E041 Nontoxic single thyroid nodule: Secondary | ICD-10-CM | POA: Diagnosis not present

## 2019-10-23 DIAGNOSIS — R002 Palpitations: Secondary | ICD-10-CM

## 2019-10-23 DIAGNOSIS — R251 Tremor, unspecified: Secondary | ICD-10-CM

## 2019-10-23 DIAGNOSIS — F419 Anxiety disorder, unspecified: Secondary | ICD-10-CM

## 2019-10-23 DIAGNOSIS — R946 Abnormal results of thyroid function studies: Secondary | ICD-10-CM

## 2019-10-25 DIAGNOSIS — Z77098 Contact with and (suspected) exposure to other hazardous, chiefly nonmedicinal, chemicals: Secondary | ICD-10-CM | POA: Diagnosis not present

## 2019-10-25 DIAGNOSIS — Z6821 Body mass index (BMI) 21.0-21.9, adult: Secondary | ICD-10-CM | POA: Diagnosis not present

## 2019-10-25 DIAGNOSIS — E0590 Thyrotoxicosis, unspecified without thyrotoxic crisis or storm: Secondary | ICD-10-CM | POA: Diagnosis not present

## 2019-10-25 DIAGNOSIS — R7989 Other specified abnormal findings of blood chemistry: Secondary | ICD-10-CM | POA: Diagnosis not present

## 2019-11-02 ENCOUNTER — Other Ambulatory Visit: Payer: Self-pay | Admitting: Nurse Practitioner

## 2019-11-02 MED ORDER — METOPROLOL SUCCINATE ER 25 MG PO TB24
25.0000 mg | ORAL_TABLET | Freq: Every day | ORAL | 3 refills | Status: DC
Start: 2019-11-02 — End: 2021-08-04

## 2019-11-02 MED ORDER — PROPRANOLOL HCL 10 MG PO TABS: 10.0000 mg | ORAL_TABLET | Freq: Four times a day (QID) | ORAL | 11 refills | Status: DC | PRN

## 2019-11-13 DIAGNOSIS — E0590 Thyrotoxicosis, unspecified without thyrotoxic crisis or storm: Secondary | ICD-10-CM | POA: Diagnosis not present

## 2019-11-14 DIAGNOSIS — R7989 Other specified abnormal findings of blood chemistry: Secondary | ICD-10-CM | POA: Diagnosis not present

## 2019-11-14 DIAGNOSIS — E0500 Thyrotoxicosis with diffuse goiter without thyrotoxic crisis or storm: Secondary | ICD-10-CM | POA: Diagnosis not present

## 2019-11-14 DIAGNOSIS — R0602 Shortness of breath: Secondary | ICD-10-CM | POA: Diagnosis not present

## 2019-11-14 DIAGNOSIS — E0590 Thyrotoxicosis, unspecified without thyrotoxic crisis or storm: Secondary | ICD-10-CM | POA: Diagnosis not present

## 2019-11-16 ENCOUNTER — Other Ambulatory Visit: Payer: Self-pay

## 2019-11-16 DIAGNOSIS — R0609 Other forms of dyspnea: Secondary | ICD-10-CM

## 2019-11-16 DIAGNOSIS — R0602 Shortness of breath: Secondary | ICD-10-CM

## 2019-11-16 DIAGNOSIS — R0989 Other specified symptoms and signs involving the circulatory and respiratory systems: Secondary | ICD-10-CM | POA: Insufficient documentation

## 2019-11-16 DIAGNOSIS — R6889 Other general symptoms and signs: Secondary | ICD-10-CM | POA: Insufficient documentation

## 2019-11-16 DIAGNOSIS — R0600 Dyspnea, unspecified: Secondary | ICD-10-CM

## 2019-11-20 ENCOUNTER — Ambulatory Visit: Payer: BC Managed Care – PPO | Admitting: Pulmonary Disease

## 2019-11-20 ENCOUNTER — Telehealth: Payer: Self-pay

## 2019-11-20 ENCOUNTER — Telehealth: Payer: Self-pay | Admitting: Pulmonary Disease

## 2019-11-20 ENCOUNTER — Telehealth (HOSPITAL_COMMUNITY): Payer: Self-pay | Admitting: *Deleted

## 2019-11-20 ENCOUNTER — Encounter: Payer: Self-pay | Admitting: Pulmonary Disease

## 2019-11-20 ENCOUNTER — Other Ambulatory Visit: Payer: Self-pay

## 2019-11-20 ENCOUNTER — Encounter: Payer: Self-pay | Admitting: Cardiovascular Disease

## 2019-11-20 VITALS — BP 122/74 | HR 92 | Temp 98.9°F | Ht 66.0 in | Wt 132.0 lb

## 2019-11-20 DIAGNOSIS — R0602 Shortness of breath: Secondary | ICD-10-CM

## 2019-11-20 DIAGNOSIS — E0500 Thyrotoxicosis with diffuse goiter without thyrotoxic crisis or storm: Secondary | ICD-10-CM

## 2019-11-20 DIAGNOSIS — F419 Anxiety disorder, unspecified: Secondary | ICD-10-CM

## 2019-11-20 DIAGNOSIS — R0609 Other forms of dyspnea: Secondary | ICD-10-CM

## 2019-11-20 DIAGNOSIS — R0600 Dyspnea, unspecified: Secondary | ICD-10-CM

## 2019-11-20 DIAGNOSIS — K219 Gastro-esophageal reflux disease without esophagitis: Secondary | ICD-10-CM | POA: Diagnosis not present

## 2019-11-20 DIAGNOSIS — G4701 Insomnia due to medical condition: Secondary | ICD-10-CM

## 2019-11-20 MED ORDER — HYDROXYZINE PAMOATE 50 MG PO CAPS: ORAL_CAPSULE | ORAL | 1 refills | Status: DC

## 2019-11-20 NOTE — Telephone Encounter (Signed)
Spoke with pt. She c/o increased SOB for past 3 months but worse over the weekend. SOB even with talking. Denies SOB during sleep. Tried Albuterol inhaler last night with some relief. PT is not taking any other inhalers at this time. Denies FCS or cough. Pt was dx with Graves Disease last week and was told that the SOB may be related. Pt also has trouble with acid reflux - taking Nexium and Famotidine. PT scheduled to see Wyn Quaker today at 11:00.

## 2019-11-20 NOTE — Progress Notes (Signed)
Cardiology Office Note   Date:  11/21/2019   ID:  [name], DOB 28-Nov-1955, MRN [id number]  PCP:  [name], MD  Cardiologist:   Mertie Moores, MD   Chief Complaint  Patient presents with  . Shortness of Breath   Problem List: 1. Hypercholesterolemia 2. Family Hx of CAD  He's been quite active. She's back playing tennis and is doing well. She denies any chest pain or shortness breath. She works out on a regular basis and is able to achieve a peak heart rate 185 160 without too much difficulty. She works out for 30 minutes at a time doing these exercises. She's also walking in the evenings with her husband.   March 23, 2013:  Darriana is doing OK. She has stopped her Atrovastatin. - she ran out and could not get back into see me.  Her LDL was 181 this last time. Her previous LDL was 108. She admits that her diet has not been as good and she has not been working out as much.   March 23, 2014:  [name] is a 64 y.o. female who presents for follow up of her hyperlipidemia.    She's done very well. She's exercising readily. She's lost a little bit of weight. She's not having any episodes of chest pain.  April 05, 2015:  [name] is seen today for follow up of her hyperlipdiemia. Staying active - golf and tennis regularly .  Walks regularly BP is up slighlty .  March 05, 2016:  Presents with stabbing like chest pain.  Quick jabs. At rest . Doesn't increase with activity. Has not been exercising as much.   Walking does not increase pain .   Not related to eating.  Not related to taking a deep breath.   Has gained some weight ( 10 lbs )  Has not been taking the atorvastatin as regularly  Has been present for 3 weeks .   Occurs almost daily .   May 04, 2017:  Doing well Working out regularly .  Feeling well  No chest pains or shortness of breath. Her last lipid levels were overall okay but her LDL is still 101.  I would like for her LDL to be  around 13 since she has a family here history of early coronary disease in her mother. She admits that she does not take her atorvastatin every day and thinks she misses doses  Most  weekends.  Oct. 9, 2020 [name] is seen today for follow of her CP and hyperlipidemia. Has been busy with doctors visit s Playing golf and tennis .  No cp.  Hikes occasionally  Tolerating the crestor well  Sept. 23, 2021:  [name] is seen today for follow up  She has had persistent dyspnea. She was hospitalized a month ago covid test was negative. Right heart cath was normal She had a negative CT angiogram of the chest.  No evidence of pulmonary embolus.  She was thought to have asthma , started on inhalers,  These caused her to have tachycardia - felt jittery,  did not feel any better  PFTs were normal   She thinks her dyspnea  may be all due to allergies.    She stopped all her pulmonary meds several days ago .  Still on rosuvastatin   We have suggested cardiopulmonary stress testing at some point .  Nov. 16, 2021  [name] is seen today for follow up of her worsening dyspnea She has been  found to have Graves disease She had not improved despite being on Methimazol for 4 weeks and Metoprolol / Propranolol for 1-2 months   I have reviewed her echo and Coronary Ct angio from Aug., 2021  She is seen back today to make sure there is no cardiac component to her dyspnea.  Her mother had a hx of premature CAD  Started Methimazole on Oct. 20, 2021:  We started propranolol and then Metoprolol mid Oct.  She is on Metoprolol XL 25 mg a day .   We have encouraged her to take some propranolol along with the metoprolol if she has palpitations   She was given hydroxyzine by pulmonary yesterday  Has not been able to get it yet ( trouble at the pharmacy)    Past Medical History:  Diagnosis Date  . Abdominal distention   . Burping    excessive burping   . Diarrhea   . GERD (gastroesophageal reflux  disease)   . Hyperlipidemia   . 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 BLADDER SURGERY    . CHOLECYSTECTOMY  2012   laparoscopic  . EUS  02/11/2011   Procedure: UPPER ENDOSCOPIC ULTRASOUND (EUS) LINEAR;  Surgeon: Landry Dyke, MD;  Location: [facility];  Service: Endoscopy;  Laterality: N/A;  mac  . RIGHT HEART CATH N/A 08/15/2019   Procedure: RIGHT HEART CATH;  Surgeon: Jolaine Artist, MD;  Location: [facility];  Service: Cardiovascular;  Laterality: N/A;  . US ECHOCARDIOGRAPHY  11/03/2006   EF 55-60%     Current Outpatient Medications  Medication Sig Dispense Refill  . Cholecalciferol (VITAMIN D3) 50 MCG (2000 UT) TABS Take 2,000 Units by mouth daily.    Marland Kitchen esomeprazole (NEXIUM) 40 MG capsule Take 1 capsule by mouth in the morning and at bedtime.     . hydrOXYzine (VISTARIL) 50 MG capsule Hydroxyzine 50 mg tablets - Can take 1/2 tablet, or 1 tablet, or 2 tablets every 8 hours as needed for anxiety/initiating sleep. Start with 25 mg (half tablet) initially to see how you tolerate. 120 capsule 1  . ibuprofen (ADVIL,MOTRIN) 200 MG tablet Take 200-600 mg by mouth every 6 (six) hours as needed for headache, mild pain or moderate pain.     . methimazole (TAPAZOLE) 10 MG tablet Take 1 tablet by mouth in the morning and at bedtime.    . metoprolol succinate (TOPROL XL) 25 MG 24 hr tablet Take 1 tablet (25 mg total) by mouth daily. 90 tablet 3  . rosuvastatin (CRESTOR) 20 MG tablet Take 1 tablet (20 mg total) by mouth daily. 90 tablet 3   No current facility-administered medications for this visit.    Allergies:   Influenza vaccines    Social History:  The patient  reports that she has never smoked. She has never used smokeless tobacco. She reports that she does not drink alcohol and does not use drugs.   Family History:  The patient's family history includes Cancer in her paternal  grandmother; Heart disease in her mother; Hypertension in her father and mother.    ROS: Noted in current history, otherwise negative.    Physical Exam: Blood pressure 114/78, pulse 69, height _0  (1.676 m), weight 133 lb (60.3 kg), SpO2 100 %.  GEN:  Middle aged female,  Mild - moderate dyspnea at rest.  Worse with walking  HEENT: Normal NECK: No JVD; No carotid  bruits LYMPHATICS: No lymphadenopathy CARDIAC: RRR , no murmurs, rubs, gallops RESPIRATORY:  Clear to auscultation without rales, wheezing or rhonchi  ABDOMEN: Soft, non-tender, non-distended MUSCULOSKELETAL:  No edema; No deformity  SKIN: Warm and dry NEUROLOGIC:  Alert and oriented x 3   EKG:    November 21, 2019: Normal sinus rhythm at 69.  No ST or T wave changes.    Recent Labs: 01/20/2019: ALT 13 08/14/2019: BUN 20; Creatinine, Ser 0.78 08/15/2019: Potassium 3.0; Sodium 144 09/19/2019: Hemoglobin 13.5; Platelets 339.0    Lipid Panel    Component Value Date/Time   CHOL 150 01/20/2019 1103   TRIG 74 01/20/2019 1103   HDL 62 01/20/2019 1103   CHOLHDL 2.4 01/20/2019 1103   CHOLHDL 2.7 04/04/2015 0905   VLDL 14 04/04/2015 0905   LDLCALC 74 01/20/2019 1103   LDLDIRECT 147.6 01/27/2011 0902      Wt Readings from Last 3 Encounters:  11/21/19 133 lb (60.3 kg)  11/20/19 132 lb (59.9 kg)  09/28/19 132 lb 6.4 oz (60.1 kg)      Other studies Reviewed: Additional studies/ records that were reviewed today include: . Review of the above records demonstrates:    ASSESSMENT AND PLAN:  1. Dysnpea:   We walked up to 200 feet up and down the hallway.  Her O2 saturations remained in the 9798 range.  The lowest saturation that I saw was 95% which occurred right when she stopped walking.  I suspect that all the symptoms are due to her hyperthyroidism and Graves' disease. Her heart sounds normal on exam.  She does not have any new murmurs or gallops.  She has a family hx of premature CAD in her mother.   She has  HLD .   It will be inportant to make sure she does not have a ruptured plaque as the cause of her worsening dyspnea.  We will anticipate doing a Lexiscan Myoview study tomorrow.   2.  Hyperlipidemia: Continue current medications. .     Current medicines are reviewed at length with the patient today.  The patient does not have concerns regarding medicines.  The following changes have been made:  no change  Labs/ tests ordered today include:   Orders Placed This Encounter  Procedures  . EKG 12-Lead     Disposition:   3 months with lipids, liver enz, bmp    Signed, Mertie Moores, MD  11/21/2019 5:46 PM    [facility] [place], [place], [place]  [address] Phone: [phone]; Fax: [phone]

## 2019-11-20 NOTE — Patient Instructions (Addendum)
You were seen today by Lauraine Rinne, NP  for:   It was a pleasure meeting you today.  I hope that you start to feel better.  I am glad that you are established with endocrinology.  As discussed at the office today I would like free to contact primary care and see what suggestions they have for better management of anxiety.  Please also schedule an appoint with gastroenterology if you continue to have persistent symptoms of reflux despite using Nexium twice daily and Pepcid.  You likely require additional evaluation by gastroenterology given the symptoms.  We will request records from your specialists.  I do believe that the shortness of breath is likely worsened given your persistent anxiety, breakthrough reflux and Graves' disease.  I am hopeful that your symptoms will improve as we get better control of these 3 issues.  We reviewed your pulmonary function testing today and have reviewed your CT imaging.  I believe these are reassuring that there is not a structural or chronic lung disorder that is causing the shortness of breath.  I do believe it is beneficial for you to continue to be established with our practice as we can continue to evaluate your shortness of breath especially once you obtain better control of the Graves' disease.  Take care, stay safe, have a happy holidays,  Aaron Edelman  1. SOB (shortness of breath)  Walk today in office >>> No oxygen desaturations which was reassuring, heart rate was elevated but fairly stable especially given known Graves' disease  Only use your albuterol as a rescue medication to be used if you can't catch your breath by resting or doing a relaxed purse lip breathing pattern.  - The less you use it, the better it will work when you need it. - Ok to use up to 2 puffs  every 4 hours if you must but call for immediate appointment if use goes up over your usual need - Don't leave home without it !!  (think of it like the spare tire for your car)   2. Graves  disease  Keep follow-up with endocrinology  Continue medications as managed by endocrinology  Keep upcoming follow-up with cardiology  3. Gastroesophageal reflux disease, unspecified whether esophagitis present  - Ambulatory referral to Gastroenterology  Contact your gastroenterologist Dr. Cristina Gong and schedule an appointment  Nexium 40 mg tablet  >>>Please take 1 tablet daily 15 minutes to 30 minutes before your first and last meal of the day as well as before your other medications >>>Try to take at the same time each day >>>take this medication daily  Continue Pepcid at night  4. Anxiety  - hydrOXYzine (VISTARIL) 50 MG capsule; Hydroxyzine 50 mg tablets - Can take 1/2 tablet, or 1 tablet, or 2 tablets every 8 hours as needed for anxiety/initiating sleep. Start with 25 mg (half tablet) initially to see how you tolerate.  Dispense: 120 capsule; Refill: 1  Hydroxyzine 50 mg tablets >>> Can take 1/2 tablet, or 1 tablet, or 2 tablets every 8 hours as needed for anxiety/initiating sleep >>> This medication is sedating >>> Start with 25 mg (half tablet) initially to see how you tolerate  Please contact primary care and notify them of your worsened anxiety.  This is likely directly related to the Graves' disease that you are actively being treated with.  They may feel inclined to start medications to help with management of some of your increased anxiety and symptoms.  Worsened or persistent anxiety can directly  affect shortness of breath.  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 Ambulatory referral to Gastroenterology    Referral Priority:   Routine    Referral Type:   Consultation    Referral Reason:   Specialty Services Required    Number of Visits Requested:   1   Orders Placed This Encounter  Procedures  . Ambulatory referral to Gastroenterology   Meds ordered this encounter  Medications  . hydrOXYzine (VISTARIL) 50 MG capsule    Sig: Hydroxyzine 50 mg tablets - Can take 1/2 tablet, or 1 tablet, or 2 tablets every 8 hours as needed for anxiety/initiating sleep. Start with 25 mg (half tablet) initially to see how you tolerate.    Dispense:  120 capsule    Refill:  1    Follow Up:    Return in about 4 weeks (around 12/18/2019), or if symptoms worsen or fail to improve, for Follow up with Dr. Vaughan Browner. If patient would like she can also be scheduled in a 30-minute time slot with a sleep doctor for consult for insomnia  Notification of test results are managed in the following manner: If there are  any recommendations or changes to the  plan of care discussed in office today,  we will contact you and let you know what they are. If you do not hear from [place], then your results are normal and you can view them through your  MyChart account , or a letter will be sent to you. Thank you again for trusting [place] with your care  - Thank you, [facility]    It is flu season:   >>> Best ways to protect herself from the flu: Receive the yearly flu vaccine, practice good hand hygiene washing with soap and also using hand sanitizer when available, eat a nutritious meals, get adequate rest, hydrate appropriately       Please contact the office if your symptoms worsen or you have concerns that you are not improving.   Thank you for choosing [facility] for your healthcare, and for  allowing [place] to partner with you on your healthcare journey. I am thankful to be able to provide care to you today.   Wyn Quaker FNP-C

## 2019-11-20 NOTE — Assessment & Plan Note (Signed)
Patient shortness of breath is likely multifactorial. We reviewed recent pulmonary function testing as well as the CTA of her chest No significant pulmonary abnormalities to account for patient's symptoms of shortness of breath  I likely believe that due to the Graves' disease as well as worsened anxiety these are contributing to the patient's shortness of breath.  There is no structural component showing an abnormality on patient's pulmonary function test.  Plan: Walk today in office Continue follow-up with endocrinology Would recommend that we continue to have pulmonary following to evaluate shortness of breath once Graves' disease is considered stable and well managed We will keep a close follow-up with the patient in 4 weeks with Dr. Vaughan Browner

## 2019-11-20 NOTE — Assessment & Plan Note (Signed)
Managed by Dr. Nehemiah Settle in [facility] Unfortunately we do not have these records Started treatment for Graves' disease 10/25/2019 Recent follow-up with endocrinology on 11/14/2019 felt to be reassuring with normal T3 and T4 levels, still elevated TSH Patient with persisting symptoms of shortness of breath as well as worsened anxiety  Plan: We will request records from endocrinology today We will route note to endocrinology today Encourage patient to continue follow-up with endocrinology Continue medications with endocrinology We will continue to see the patient for evaluation of her dyspnea especially once Graves' disease is stable and managed

## 2019-11-20 NOTE — Assessment & Plan Note (Signed)
Persisting symptoms of anxiety Patient is tearful throughout most of the exam and visit The ongoing work-up as well as multiple appointments with specialists has been weighing on the patient Patient also struggling with sleep  Plan: Encourage patient to discuss worsening symptoms of anxiety with primary care Explained to patient that likely her anxiety which is a new symptom for her is directly related to her Graves' diagnosis We will provide prescription for hydroxyzine for the patient to start using If she needs stronger or more chronic agents would recommend management with primary care or referral to psychiatry/therapy

## 2019-11-20 NOTE — Telephone Encounter (Signed)
Left message for the pt to call back.   Will place order for stress test.

## 2019-11-20 NOTE — Progress Notes (Signed)
@Patient  ID: [name], female    DOB: 05-Feb-1955, 64 y.o.   MRN: [id number]  Chief Complaint  Patient presents with  . Acute Visit    Increase shortness of breath, recent diagnosis of Graves' disease    Referring provider: [name], MD  HPI:  64 year old female never smoker initially referred to our office in September/2021 for evaluation of dyspnea  PMH: GERD, Graves' disease, hyperlipidemia Smoker/ Smoking History: Never smoker Maintenance: None Pt of: Dr. Vaughan Browner  11/20/2019  - Visit   64 year old female never smoker initially referred to our office in September/2021 for evaluation of dyspnea.  She establish care with Dr. Vaughan Browner.  She was reporting the severe dyspnea with onset in July/2021.  She had been tested multiple times negative for COVID-19.  In August/2021 she had evaluation with a right heart cath with no pulmonary hypertension.  Negative CTA no PE or lung abnormalities.  CT coronaries with no significant coronary artery disease.  Plan of care from that office visit was for her to continue Flonase nasal spray, start chlor tabs over-the-counter, check CBC with differential, IgE and trial of Symbicort inhaler.  Patient patient had labs were normal.  Patient had a suspected reaction to high-dose Symbicort.  It was recommended that she stop this back in September/2021.  Patient is also recently seen Dr. Redmond Baseman with the ENT as a consult.  That assessment and plan is listed below:  Impression & Plans:  [name] is a 64 y.o. female with dyspnea, a chronic illness with exacerbation, and throat clearing.  - We discussed her symptoms and clinical course at length. In reviewing her records, a clear cause of her shortness of breath remains unclear. A cardiopulmonary stress test is being considered. She does not exhibit stridor or symptoms suggestive of an otolaryngic cause of her problem. I agreed that her throat clearing may be reflux-related but I have never seen that  problem result in true shortness of breath. She is taking twice daily Nexium and daily famotidine. Allergy/asthma has been considered. I encouraged cardiology and pulmonology follow-up.  Pamela Quitter, MD Otolaryngology  Patient presented to her office today reporting that she has had ongoing shortness of breath since July/2021.  August/2021 pulmonary function testing shows normal pulmonary function testing with a minimal diffusion defect which corrects for lung volumes.  Patient provided a detailed timeline of her symptoms.  This has been signed and added to the chart via the media tab.  She initially was seen by Dr. Vaughan Browner in September/2021.  No improvement with an inhaler as well as CBC with differential and IgE were both normal.  Patient keeps regular follow-up with her cardiologist Dr. Katharina Caper.  In October/2020 when she was seen by an allergist Dr. Remus Blake.  She was found to have many environmental allergens.  We do not have these records on file.  She is now working with one of her good friends who is a ear nose and throat provider.  They had recommended that she see an ear nose and throat provider which is what prompted the above referral to Dr. Redmond Baseman.  She was also encouraged to start Nexium 40 mg twice daily as well as famotidine 40 mg nightly.  She has been managed on this since October/2021.  She has a gastroenterologist Dr. Cristina Gong who she is seen before for colonoscopies but she has not seen him at all through the length of the symptoms.  On 10/25/2019 she was seen by Dr. Nehemiah Settle and endocrinologist with [facility].  She was diagnosed with Graves' disease and put on methimazole.  She reports that with a follow-up on 11/14/2019 that her T3 and T4 now in correct range for TSH remains elevated.  She reports a normal EKG as well as physical exam on 11/14/2019 by Dr. Nehemiah Settle.   Patient reports a worsened aspect of her shortness of breath over the last 3 days.  She is unsure  what may have caused this.  She utilized her albuterol inhaler which she is unsure if this helped.  She feels that it made her heart race more.  She has an upcoming appointment with cardiology tomorrow due to the symptoms.  Patient throughout interview today keeps reporting that she is not "herself".  She admits that she is persisting anxiety.  She is also having trouble falling asleep.  She reports that this is due to the fact that she can feel her heart beating.  She has recently taken her husband's Ambien.  She reports that she discussed this with her cardiologist and they said this was okay.  She is unsure if she can remain on this.  She is not on any sort of medications to help with management of anxiety.  Patient was able to complete 1 lap in office.  Oxygen levels were stable on room air.  Patient's heart rate went from 94-115.  Patient was very dyspneic and fatigued when walking.  Questionaires / Pulmonary Flowsheets:   ACT:  No flowsheet data found.  MMRC: No flowsheet data found.  Epworth:  No flowsheet data found.  Tests:   Imaging: CTA 08/13/2019-no PE, lungs are clear CT coronaries 08/14/2019-mild coronary artery disease, visualized lung fields are clear   PFTs: 08/28/2019 FVC 3.39 [96%], FEV1 2.82 (104%], F/F 83, TLC 5.61 [104%], DLCO 16.36 [76%] Minimal diffusion defect  Labs:  Cardiac: Right heart cath 08/15/2019- Normal RHC with no evidence of PAH, volume overload or intracardiac shunting.  PA = 20/5 (12) PCW = 7 Fick cardiac output/index =  6.5/3.8 PVR = 0.8 WU  Echocardiogram 08/13/2019 LVEF 60-65%, normal RV systolic function, normal PA systolic pressure  5/63/1497-WYO with differential-eosinophils relative 2.9, eosinophils absolute 0.1 09/19/2019-IgE-29  FENO:  No results found for: NITRICOXIDE  PFT: PFT Results Latest Ref Rng & Units 08/14/2019  FVC-Pre L 3.51  FVC-Predicted Pre % 100  FVC-Post L 3.39  FVC-Predicted Post % 96  Pre FEV1/FVC % % 79  Post  FEV1/FCV % % 83  FEV1-Pre L 2.79  FEV1-Predicted Pre % 103  FEV1-Post L 2.82  DLCO uncorrected ml/min/mmHg 16.36  DLCO UNC% % 76  DLCO corrected ml/min/mmHg 16.31  DLCO COR %Predicted % 76  DLVA Predicted % 81  TLC L 5.61  TLC % Predicted % 104  RV % Predicted % 101    WALK:  SIX MIN WALK 11/20/2019  Supplimental Oxygen during Test? (L/min) No  Tech Comments: Slow paced with no desats. Patient had to stop after 1 lap due to fatigue and shaky gait.    Imaging: US THYROID  Result Date: 10/24/2019 CLINICAL DATA:  Thyroid nodules, previous biopsy right inferior thyroid nodule 01/07/2017 EXAM: THYROID ULTRASOUND TECHNIQUE: Ultrasound examination of the thyroid gland and adjacent soft tissues was performed. COMPARISON:  12/11/2016, 01/07/2017 FINDINGS: Parenchymal Echotexture: Mildly heterogenous Isthmus: 2 mm Right lobe: 5.4 x 1.8 x 1.8 cm Left lobe: 3.7 x 1.0 x 1.3 cm _________________________________________________________ Estimated total number of nodules >/= 1 cm: 1 Number of spongiform nodules >/=  2 cm not described below (  TR1): 0 Number of mixed cystic and solid nodules >/= 1.5 cm not described below (TR2): 0 _________________________________________________________ The previously biopsied right inferior TR 4 nodule measures 2.7 x 2.1 x 1.7 cm, previously 2.2 x 1.5 x 1.1 cm. Correlate with prior pathology. Nodule is slightly larger with central cystic change. Subcentimeter isoechoic nodule in the left thyroid superiorly measures only 9 mm, unchanged. This would not meet criteria for any biopsy or follow-up and is not fully described by TI rads criteria. Normal vascularity.  No regional adenopathy. IMPRESSION: 2.7 cm right inferior TR 4 nodule, slightly larger suspect related to central cystic degeneration. Correlate with prior pathology. No other significant interval change by ultrasound. The above is in keeping with the ACR TI-RADS recommendations - J Am Coll Radiol 2017;14:587-595.  Electronically Signed   By: Jerilynn Mages.  Shick M.D.   On: 10/24/2019 15:32    Lab Results:  CBC    Component Value Date/Time   WBC 4.0 09/19/2019 0945   RBC 4.61 09/19/2019 0945   HGB 13.5 09/19/2019 0945   HCT 40.1 09/19/2019 0945   PLT 339.0 09/19/2019 0945   MCV 87.0 09/19/2019 0945   MCH 29.1 08/13/2019 1816   MCHC 33.6 09/19/2019 0945   RDW 12.9 09/19/2019 0945   LYMPHSABS 1.3 09/19/2019 0945   MONOABS 0.6 09/19/2019 0945   EOSABS 0.1 09/19/2019 0945   BASOSABS 0.0 09/19/2019 0945    BMET    Component Value Date/Time   NA 144 08/15/2019 1249   NA 139 08/12/2018 1054   K 3.0 (L) 08/15/2019 1249   CL 110 08/14/2019 0540   CO2 19 (L) 08/14/2019 0540   GLUCOSE 101 (H) 08/14/2019 0540   BUN 20 08/14/2019 0540   BUN 18 08/12/2018 1054   CREATININE 0.78 08/14/2019 0540   CREATININE 0.90 04/04/2015 0905   CALCIUM 8.5 (L) 08/14/2019 0540   GFRNONAA >60 08/14/2019 0540   GFRAA >60 08/14/2019 0540    BNP No results found for: BNP  ProBNP No results found for: PROBNP  Specialty Problems      Pulmonary Problems   SOB (shortness of breath)      Allergies  Allergen Reactions  . Influenza Vaccines Nausea And Vomiting and Other (See Comments)    "Patient reported a severe allergic reaction to the influenza vaccine- several headaches, n/v    Immunization History  Administered Date(s) Administered  . Moderna SARS-COVID-2 Vaccination 01/16/2019, 02/06/2019    Past Medical History:  Diagnosis Date  . Abdominal distention   . Burping    excessive burping   . Diarrhea   . GERD (gastroesophageal reflux disease)   . Hyperlipidemia   . 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 albuterol (VENTOLIN HFA) 108  (90 Base) MCG/ACT inhaler SMARTSIG:2 Puff(s) By Mouth Every 4-6 Hours PRN  . alendronate (FOSAMAX) 70 MG tablet Take 70 mg by mouth every Monday. Take with a full glass of water on an empty stomach.   Marland Kitchen azelastine (ASTELIN) 0.1 % nasal spray Place 1 spray into both nostrils 2 (two) times daily.  . Cholecalciferol (VITAMIN D3) 50 MCG (2000 UT) TABS Take 2,000 Units by mouth daily.  Marland Kitchen esomeprazole (NEXIUM) 40 MG capsule Take 1 capsule by mouth daily.  Marland Kitchen  fluticasone (FLONASE) 50 MCG/ACT nasal spray Place 1 spray into both nostrils daily.  Marland Kitchen ibuprofen (ADVIL,MOTRIN) 200 MG tablet Take 200-600 mg by mouth every 6 (six) hours as needed for headache, mild pain or moderate pain.   Marland Kitchen levocetirizine (XYZAL) 5 MG tablet SMARTSIG:1 Tablet(s) By Mouth Every Evening  . metoprolol succinate (TOPROL XL) 25 MG 24 hr tablet Take 1 tablet (25 mg total) by mouth daily.  . predniSONE (DELTASONE) 10 MG tablet Take by mouth.  . propranolol (INDERAL) 10 MG tablet Take 1 tablet (10 mg total) by mouth 4 (four) times daily as needed.  . rosuvastatin (CRESTOR) 20 MG tablet Take 1 tablet (20 mg total) by mouth daily.  Marland Kitchen tobramycin-dexamethasone (TOBRADEX) ophthalmic solution SHAKE LIQUID AND INSTILL 1 DROP IN BOTH EYES THREE TIMES DAILY  . ARNUITY ELLIPTA 100 MCG/ACT AEPB Inhale 1 puff into the lungs daily. (Patient not taking: Reported on 11/20/2019)  . hydrOXYzine (VISTARIL) 50 MG capsule Hydroxyzine 50 mg tablets - Can take 1/2 tablet, or 1 tablet, or 2 tablets every 8 hours as needed for anxiety/initiating sleep. Start with 25 mg (half tablet) initially to see how you tolerate.   No facility-administered encounter medications on file as of 11/20/2019.     Review of Systems  Review of Systems  Constitutional: Positive for activity change and fatigue. Negative for fever.  HENT: Positive for congestion. Negative for sinus pressure, sinus pain and sore throat.   Respiratory: Positive for shortness of breath. Negative  for cough and wheezing.   Cardiovascular: Negative for chest pain and palpitations.  Gastrointestinal: Negative for diarrhea, nausea and vomiting.       +gerd  Musculoskeletal: Negative for arthralgias.  Neurological: Negative for dizziness.  Psychiatric/Behavioral: Positive for sleep disturbance. The patient is nervous/anxious.      Physical Exam  BP 122/74 (BP Location: Left Arm, Cuff Size: Normal)   Pulse 92   Temp 98.9 F (37.2 C) (Oral)   Ht 5\' 6"  (1.676 m)   Wt 132 lb (59.9 kg)   SpO2 99%   BMI 21.31 kg/m   Wt Readings from Last 5 Encounters:  11/20/19 132 lb (59.9 kg)  09/28/19 132 lb 6.4 oz (60.1 kg)  09/19/19 132 lb 9.6 oz (60.1 kg)  08/15/19 130 lb 4.7 oz (59.1 kg)  10/14/18 136 lb (61.7 kg)    BMI Readings from Last 5 Encounters:  11/20/19 21.31 kg/m  09/28/19 21.37 kg/m  09/19/19 21.40 kg/m  08/15/19 21.03 kg/m  10/14/18 21.95 kg/m     Physical Exam Vitals and nursing note reviewed.  Constitutional:      General: She is not in acute distress.    Appearance: Normal appearance. She is normal weight.  HENT:     Head: Normocephalic and atraumatic.     Right Ear: External ear normal.     Left Ear: External ear normal.     Mouth/Throat:     Mouth: Mucous membranes are moist.     Pharynx: Oropharynx is clear.  Eyes:     Pupils: Pupils are equal, round, and reactive to light.  Cardiovascular:     Rate and Rhythm: Normal rate and regular rhythm.     Pulses: Normal pulses.     Heart sounds: Normal heart sounds. No murmur heard.   Pulmonary:     Effort: Respiratory distress present.     Breath sounds: Normal breath sounds. No decreased air movement. No decreased breath sounds, wheezing or rales.  Musculoskeletal:     Cervical  back: Normal range of motion.     Right lower leg: No edema.     Left lower leg: No edema.  Skin:    General: Skin is warm and dry.     Capillary Refill: Capillary refill takes less than 2 seconds.  Neurological:      General: No focal deficit present.     Mental Status: She is alert and oriented to person, place, and time. Mental status is at baseline.     Motor: Weakness present.     Gait: Gait normal.  Psychiatric:        Mood and Affect: Mood normal.        Behavior: Behavior normal.        Thought Content: Thought content normal.        Judgment: Judgment normal.       Assessment & Plan:   Esophageal reflux Persisting breakthrough reflux despite Nexium twice daily and Pepcid nightly Previously established with Dr. Cristina Gong with [facility] gastro[facility]ology Has not seen in over a year  Plan: Encourage patient to contact Dr. Osborn Coho office today to schedule a follow-up Referral to [facility] GI placed Continue Nexium twice daily Continue Pepcid   Graves disease Managed by Dr. Nehemiah Settle in [place] Unfortunately we do not have these records Started treatment for Graves' disease 10/25/2019 Recent follow-up with endocrinology on 11/14/2019 felt to be reassuring with normal T3 and T4 levels, still elevated TSH Patient with persisting symptoms of shortness of breath as well as worsened anxiety  Plan: We will request records from endocrinology today We will route note to endocrinology today Encourage patient to continue follow-up with endocrinology Continue medications with endocrinology We will continue to see the patient for evaluation of her dyspnea especially once Graves' disease is stable and managed  Anxiety Persisting symptoms of anxiety Patient is tearful throughout most of the exam and visit The ongoing work-up as well as multiple appointments with specialists has been weighing on the patient Patient also struggling with sleep  Plan: Encourage patient to discuss worsening symptoms of anxiety with primary care Explained to patient that likely her anxiety which is a new symptom for her is directly related to her Graves' diagnosis We will provide prescription for  hydroxyzine for the patient to start using If she needs stronger or more chronic agents would recommend management with primary care or referral to psychiatry/therapy  Insomnia due to medical condition Persisting symptoms of insomnia in the setting of recent diagnosis of Graves' disease Patient recently took her husband's Ambien and this helped  Plan: Encourage patient to follow-up with primary care regarding short-term prescription of Ambien Patient can establish with one of our sleep doctors here for chronic management  SOB (shortness of breath) Patient shortness of breath is likely multifactorial. We reviewed recent pulmonary function testing as well as the CTA of her chest No significant pulmonary abnormalities to account for patient's symptoms of shortness of breath  I likely believe that due to the Graves' disease as well as worsened anxiety these are contributing to the patient's shortness of breath.  There is no structural component showing an abnormality on patient's pulmonary function test.  Plan: Walk today in office Continue follow-up with endocrinology Would recommend that we continue to have pulmonary following to evaluate shortness of breath once Graves' disease is considered stable and well managed We will keep a close follow-up with the patient in 4 weeks with Dr. Vaughan Browner    Return in about 4 weeks (around 12/18/2019), or  if symptoms worsen or fail to improve, for Follow up with Dr. Vaughan Browner.   Lauraine Rinne, NP 11/20/2019   This appointment required 55 minutes of patient care (this includes precharting, chart review, review of results, face-to-face care, etc.).

## 2019-11-20 NOTE — Telephone Encounter (Signed)
-----   Message from Thayer Headings, MD sent at 11/20/2019  7:59 AM EST ----- Good morning  triage team,  [name] has continued to have worsening dyspnea. She has a appt with me tomoorow at 1:40. Please see if we can get a Lexiscan myoivew in the next several days. Thanks  [employer]

## 2019-11-20 NOTE — Telephone Encounter (Signed)
Pt given appt for Lexiscan Myoview for this Wed 11/22/19 at 7:15am.   Will continue to try to get sooner at another location.

## 2019-11-20 NOTE — Telephone Encounter (Signed)
Left message on voicemail per DPR in reference to upcoming appointment scheduled on 11/22/19 with detailed instructions given per Myocardial Perfusion Study Information Sheet for the test. LM to arrive 15 minutes early, and that it is imperative to arrive on time for appointment to keep from having the test rescheduled. If you need to cancel or reschedule your appointment, please call the office within 24 hours of your appointment. Failure to do so may result in a cancellation of your appointment, and a $50 no show fee. Phone number given for call back for any questions. Kirstie Peri

## 2019-11-20 NOTE — Telephone Encounter (Signed)
Spoke with the pt and she verbalized understanding and agreed to having a [employer].

## 2019-11-20 NOTE — Assessment & Plan Note (Signed)
Persisting symptoms of insomnia in the setting of recent diagnosis of Graves' disease Patient recently took her husband's Ambien and this helped  Plan: Encourage patient to follow-up with primary care regarding short-term prescription of Ambien Patient can establish with one of our sleep doctors here for chronic management

## 2019-11-20 NOTE — Assessment & Plan Note (Signed)
Persisting breakthrough reflux despite Nexium twice daily and Pepcid nightly Previously established with Dr. Cristina Gong with [facility] gastroenterology Has not seen in over a year  Plan: Encourage patient to contact Dr. Osborn Coho office today to schedule a follow-up Referral to [facility] GI placed Continue Nexium twice daily Continue Pepcid

## 2019-11-21 ENCOUNTER — Ambulatory Visit: Payer: BC Managed Care – PPO | Admitting: Cardiovascular Disease

## 2019-11-21 ENCOUNTER — Encounter: Payer: Self-pay | Admitting: Cardiovascular Disease

## 2019-11-21 VITALS — BP 114/78 | HR 69 | Ht 66.0 in | Wt 133.0 lb

## 2019-11-21 DIAGNOSIS — Z8249 Family history of ischemic heart disease and other diseases of the circulatory system: Secondary | ICD-10-CM

## 2019-11-21 DIAGNOSIS — R0609 Other forms of dyspnea: Secondary | ICD-10-CM

## 2019-11-21 DIAGNOSIS — R0602 Shortness of breath: Secondary | ICD-10-CM

## 2019-11-21 DIAGNOSIS — E782 Mixed hyperlipidemia: Secondary | ICD-10-CM | POA: Diagnosis not present

## 2019-11-21 DIAGNOSIS — R0600 Dyspnea, unspecified: Secondary | ICD-10-CM

## 2019-11-21 DIAGNOSIS — R0789 Other chest pain: Secondary | ICD-10-CM

## 2019-11-21 MED ORDER — HYDROXYZINE PAMOATE 50 MG PO CAPS: ORAL_CAPSULE | ORAL | 1 refills | Status: DC

## 2019-11-21 NOTE — Patient Instructions (Addendum)
Your heart sounds fine The valves sound fine. Since these are normal, I don't think you need a repeat echo any time soon .   We are doing a chemical stress test to make sure you dont have a coronary artery narrowing.  Take propranolol 10 mg as needed for heart pounding .  You may take it up to 4 times a day . Try taking one before bedtime.  Continue the metoprolol XL 25 mg a day.    Medication Instructions:  Your physician recommends that you continue on your current medications as directed. Please refer to the Current Medication list given to you today.  *If you need a refill on your cardiac medications before your next appointment, please call your pharmacy*   Lab Work: none If you have labs (blood work) drawn today and your tests are completely normal, you will receive your results only by: Marland Kitchen MyChart Message (if you have MyChart) OR . A paper copy in the mail If you have any lab test that is abnormal or we need to change your treatment, we will call you to review the results.   Testing/Procedures: Stress test tomorrow    Follow-Up: At [facility], you and your health needs are our priority.  As part of our continuing mission to provide you with exceptional heart care, we have created designated Provider Care Teams.  These Care Teams include your primary Cardiologist (physician) and Advanced Practice Providers (APPs -  Physician Assistants and Nurse Practitioners) who all work together to provide you with the care you need, when you need it.  We recommend signing up for the patient portal called "MyChart".  Sign up information is provided on this After Visit Summary.  MyChart is used to connect with patients for Virtual Visits (Telemedicine).  Patients are able to view lab/test results, encounter notes, upcoming appointments, etc.  Non-urgent messages can be sent to your provider as well.   To learn more about what you can do with MyChart, go to [email or web address].    Your  next appointment:   3 month(s)  The format for your next appointment:   In Person  Provider:   Mertie Moores, MD   Other Instructions

## 2019-11-21 NOTE — Telephone Encounter (Signed)
Called and spoke with Patient.  Patient stated she was prescribed hydroxyzine and prescription was sent to [facility].  Patient stated [facility] had a computer problem yesterday, and prescriptions were unable to be completed for Patients. Patient requested hydroxyzine to be [facility] [address] [facility] [address]. Patient requested prescription to be cancelled at [facility]. [facility].  Called [facility] and cancelled hydroxyzine prescription.  Nothing further at this time.

## 2019-11-22 ENCOUNTER — Other Ambulatory Visit: Payer: Self-pay

## 2019-11-22 ENCOUNTER — Ambulatory Visit (HOSPITAL_COMMUNITY): Payer: BC Managed Care – PPO | Attending: Cardiovascular Disease

## 2019-11-22 DIAGNOSIS — R0600 Dyspnea, unspecified: Secondary | ICD-10-CM | POA: Insufficient documentation

## 2019-11-22 DIAGNOSIS — R0602 Shortness of breath: Secondary | ICD-10-CM | POA: Insufficient documentation

## 2019-11-22 DIAGNOSIS — R0609 Other forms of dyspnea: Secondary | ICD-10-CM

## 2019-11-22 LAB — MYOCARDIAL PERFUSION IMAGING
LV dias vol: 44 mL (ref 46–106)
LV sys vol: 12 mL
Peak HR: 105 {beats}/min
Rest HR: 61 {beats}/min
SDS: 0
SRS: 0
SSS: 0
TID: 0.88

## 2019-11-22 MED ORDER — TECHNETIUM TC 99M TETROFOSMIN IV KIT
31.6000 | PACK | Freq: Once | INTRAVENOUS | Status: AC | PRN
  Administered 2019-11-22: 31.6 via INTRAVENOUS
  Filled 2019-11-22: qty 32

## 2019-11-22 MED ORDER — TECHNETIUM TC 99M TETROFOSMIN IV KIT
10.6000 | PACK | Freq: Once | INTRAVENOUS | Status: AC | PRN
  Administered 2019-11-22: 10.6 via INTRAVENOUS
  Filled 2019-11-22: qty 11

## 2019-11-22 MED ORDER — REGADENOSON 0.4 MG/5ML IV SOLN
0.4000 mg | Freq: Once | INTRAVENOUS | Status: AC
  Administered 2019-11-22: 0.4 mg via INTRAVENOUS

## 2019-11-29 DIAGNOSIS — K219 Gastro-esophageal reflux disease without esophagitis: Secondary | ICD-10-CM | POA: Diagnosis not present

## 2019-12-11 DIAGNOSIS — E0590 Thyrotoxicosis, unspecified without thyrotoxic crisis or storm: Secondary | ICD-10-CM | POA: Diagnosis not present

## 2019-12-12 DIAGNOSIS — R142 Eructation: Secondary | ICD-10-CM | POA: Diagnosis not present

## 2019-12-12 DIAGNOSIS — K219 Gastro-esophageal reflux disease without esophagitis: Secondary | ICD-10-CM | POA: Diagnosis not present

## 2019-12-14 DIAGNOSIS — E0500 Thyrotoxicosis with diffuse goiter without thyrotoxic crisis or storm: Secondary | ICD-10-CM | POA: Diagnosis not present

## 2019-12-14 DIAGNOSIS — E0590 Thyrotoxicosis, unspecified without thyrotoxic crisis or storm: Secondary | ICD-10-CM | POA: Diagnosis not present

## 2019-12-14 DIAGNOSIS — R7989 Other specified abnormal findings of blood chemistry: Secondary | ICD-10-CM | POA: Diagnosis not present

## 2019-12-14 DIAGNOSIS — R0602 Shortness of breath: Secondary | ICD-10-CM | POA: Diagnosis not present

## 2019-12-18 ENCOUNTER — Other Ambulatory Visit: Payer: Self-pay

## 2019-12-18 ENCOUNTER — Ambulatory Visit: Payer: BC Managed Care – PPO | Admitting: Pulmonary Disease

## 2019-12-18 ENCOUNTER — Encounter: Payer: Self-pay | Admitting: Pulmonary Disease

## 2019-12-18 VITALS — BP 118/70 | HR 61 | Temp 97.6°F | Ht 66.0 in | Wt 135.4 lb

## 2019-12-18 DIAGNOSIS — R0602 Shortness of breath: Secondary | ICD-10-CM

## 2019-12-18 DIAGNOSIS — F419 Anxiety disorder, unspecified: Secondary | ICD-10-CM | POA: Diagnosis not present

## 2019-12-18 DIAGNOSIS — E0500 Thyrotoxicosis with diffuse goiter without thyrotoxic crisis or storm: Secondary | ICD-10-CM

## 2019-12-18 NOTE — Patient Instructions (Addendum)
I am glad you are doing better with regard to your breathing  As we discussed your lung function work-up is okay  Please start a gradual exercise regimen at home.  I think it should be okay to resume tennis in January  Please call us if you have any issues.  You can follow-up in pulmonary clinic as needed.

## 2019-12-18 NOTE — Progress Notes (Signed)
[name]    [id number]    September 14, 1955  Primary Care Physician:[name], MD  Referring Physician: [name], MD [address] [place],  [place] [address]  Chief complaint: Follow up for dyspnea  HPI: 64 year old with history of hyperlipidemia, GERD Complains of severe dyspnea with onset in July 2021.  She had an episode of bacterial conjunctivitis treated with prednisone eyedrops catheters and a trip to [place] She subsequently developed throat congestion, nasal discharge, postnasal drip intermittent shortness of breath. Report shortness of breath while talking and with minimal exertion.  Tested negative multiple times for Covid.  She was hospitalized in August for evaluation with negative right heart catheterization with no pulmonary hypertension or intracardiac shunt, negative CTA with no pulmonary embolism or lung abnormalities.  She had a CT coronaries with no significant coronary disease and has been referred here for further evaluation   Pets: Had a dog who died recently in 03-Apr-2019 Occupation: Retired.  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  No mold, hot tub, Jacuzzi Smoking history: Originally from [place].  Moved to [place] around 04-03-98 Travel history: Trip to [place] earlier in 04/03/2019. Relevant family history: No significant family history of lung disease  Interim history: Tried on Symbicort and Flonase at last visit for possible diagnosis of reactive airway disease.  This caused worse dyspnea, palpitations and hence she stopped this Diagnosed with Graves disease and started on methimazole.  Also given hydroxyzine for anxiety  Overall her symptoms have improved but she still has some fatigue.  GERD symptoms have improved as well. Starting to exercise again. Continues on beta-blockers per cardiology.  She also underwent outpatient cardiac stress test which was unremarkable.  Outpatient Encounter  Medications as of 12/18/2019  Medication Sig  . Cholecalciferol (VITAMIN D3) 50 MCG (2000 UT) TABS Take 2,000 Units by mouth daily.  Marland Kitchen esomeprazole (NEXIUM) 40 MG capsule Take 1 capsule by mouth in the morning and at bedtime.   . hydrOXYzine (VISTARIL) 50 MG capsule Hydroxyzine 50 mg tablets - Can take 1/2 tablet, or 1 tablet, or 2 tablets every 8 hours as needed for anxiety/initiating sleep. Start with 25 mg (half tablet) initially to see how you tolerate.  Marland Kitchen ibuprofen (ADVIL,MOTRIN) 200 MG tablet Take 200-600 mg by mouth every 6 (six) hours as needed for headache, mild pain or moderate pain.   . methimazole (TAPAZOLE) 10 MG tablet Take 1 tablet by mouth in the morning and at bedtime. After breakfast and after dinner  . metoprolol succinate (TOPROL XL) 25 MG 24 hr tablet Take 1 tablet (25 mg total) by mouth daily.  . rosuvastatin (CRESTOR) 20 MG tablet Take 1 tablet (20 mg total) by mouth daily.   No facility-administered encounter medications on file as of 12/18/2019.   Physical Exam: Blood pressure 118/70, pulse 61, temperature 97.6 F (36.4 C), temperature source Skin, height 5\' 6"  (1.676 m), weight 135 lb 6.4 oz (61.4 kg), SpO2 98 %. Gen:      No acute distress HEENT:  EOMI, sclera anicteric Neck:     No masses; no thyromegaly Lungs:    Clear to auscultation bilaterally; normal respiratory effort CV:         Regular rate and rhythm; no murmurs Abd:      + bowel sounds; soft, non-tender; no palpable masses, no distension Ext:    No edema; adequate peripheral perfusion Skin:      Warm and  dry; no rash Neuro: alert and oriented x 3 Psych: normal mood and affect  Data Reviewed: Imaging: CTA 08/13/2019-no PE, lungs are clear CT coronaries 08/14/2019-mild coronary artery disease, visualized lung fields are clear I have reviewed the images personally  PFTs: 08/28/2019 FVC 3.39 [96%], FEV1 2.82 (104%], F/F 83, TLC 5.61 [104%], DLCO 16.36 [76%] Minimal diffusion defect  Labs: CBC  09/19/2019-WBC 4, eos 2.9%, absolute eosinophil count 116 IgE 09/19/2019-29  Cardiac: Right heart cath 08/15/2019- Normal RHC with no evidence of PAH, volume overload or intracardiac shunting.  PA = 20/5 (12) PCW = 7 Fick cardiac output/index =  6.5/3.8 PVR = 0.8 WU  Echocardiogram 08/13/2019 LVEF 60-65%, normal RV systolic function, normal PA systolic pressure  Cardiac stress test 11/22/2019 No ischemia, LVEF greater than 65%.  Assessment:  Evaluation for dyspnea Etiology is unclear with negative work-up as noted above.  No evidence of reactive airway disease or lung abnormalities on PFTs and imaging.  Overall symptoms improved with treatment for Graves' disease and I suspect her presentation is related to hypothyroidism, anxiety and palpitations  She is going to start an exercise regimen at home and eventually get back to playing tennis. Continue hydroxyzine as needed for anxiety.  She can follow-up in pulmonary clinic as needed.  GERD Continue PPI, follow-up with GI.  Plan/Recommendations: Follow-up as needed.  Marshell Garfinkel MD [facility] 12/18/2019, 9:22 AM  CC: [name], MD

## 2020-01-03 DIAGNOSIS — R87619 Unspecified abnormal cytological findings in specimens from cervix uteri: Secondary | ICD-10-CM | POA: Diagnosis not present

## 2020-01-03 DIAGNOSIS — Z01419 Encounter for gynecological examination (general) (routine) without abnormal findings: Secondary | ICD-10-CM | POA: Diagnosis not present

## 2020-01-09 ENCOUNTER — Other Ambulatory Visit: Payer: BC Managed Care – PPO

## 2020-01-11 ENCOUNTER — Ambulatory Visit: Payer: BC Managed Care – PPO | Admitting: Cardiovascular Disease

## 2020-01-25 DIAGNOSIS — E0590 Thyrotoxicosis, unspecified without thyrotoxic crisis or storm: Secondary | ICD-10-CM | POA: Diagnosis not present

## 2020-01-30 DIAGNOSIS — Z6821 Body mass index (BMI) 21.0-21.9, adult: Secondary | ICD-10-CM | POA: Diagnosis not present

## 2020-01-30 DIAGNOSIS — E0500 Thyrotoxicosis with diffuse goiter without thyrotoxic crisis or storm: Secondary | ICD-10-CM | POA: Diagnosis not present

## 2020-01-30 DIAGNOSIS — E0590 Thyrotoxicosis, unspecified without thyrotoxic crisis or storm: Secondary | ICD-10-CM | POA: Diagnosis not present

## 2020-01-30 DIAGNOSIS — Z77098 Contact with and (suspected) exposure to other hazardous, chiefly nonmedicinal, chemicals: Secondary | ICD-10-CM | POA: Diagnosis not present

## 2020-02-08 ENCOUNTER — Other Ambulatory Visit: Payer: Self-pay | Admitting: Cardiovascular Disease

## 2020-02-08 DIAGNOSIS — E0590 Thyrotoxicosis, unspecified without thyrotoxic crisis or storm: Secondary | ICD-10-CM | POA: Diagnosis not present

## 2020-02-13 DIAGNOSIS — E0500 Thyrotoxicosis with diffuse goiter without thyrotoxic crisis or storm: Secondary | ICD-10-CM | POA: Diagnosis not present

## 2020-02-13 DIAGNOSIS — Z6821 Body mass index (BMI) 21.0-21.9, adult: Secondary | ICD-10-CM | POA: Diagnosis not present

## 2020-02-13 DIAGNOSIS — E0590 Thyrotoxicosis, unspecified without thyrotoxic crisis or storm: Secondary | ICD-10-CM | POA: Diagnosis not present

## 2020-02-13 DIAGNOSIS — Z77098 Contact with and (suspected) exposure to other hazardous, chiefly nonmedicinal, chemicals: Secondary | ICD-10-CM | POA: Diagnosis not present

## 2020-02-22 DIAGNOSIS — K219 Gastro-esophageal reflux disease without esophagitis: Secondary | ICD-10-CM | POA: Diagnosis not present

## 2020-02-22 DIAGNOSIS — K117 Disturbances of salivary secretion: Secondary | ICD-10-CM | POA: Diagnosis not present

## 2020-02-22 DIAGNOSIS — R142 Eructation: Secondary | ICD-10-CM | POA: Diagnosis not present

## 2020-03-06 DIAGNOSIS — E0590 Thyrotoxicosis, unspecified without thyrotoxic crisis or storm: Secondary | ICD-10-CM | POA: Diagnosis not present

## 2020-04-03 DIAGNOSIS — E0500 Thyrotoxicosis with diffuse goiter without thyrotoxic crisis or storm: Secondary | ICD-10-CM | POA: Diagnosis not present

## 2020-04-10 DIAGNOSIS — Z6821 Body mass index (BMI) 21.0-21.9, adult: Secondary | ICD-10-CM | POA: Diagnosis not present

## 2020-04-10 DIAGNOSIS — Z77098 Contact with and (suspected) exposure to other hazardous, chiefly nonmedicinal, chemicals: Secondary | ICD-10-CM | POA: Diagnosis not present

## 2020-04-10 DIAGNOSIS — E0500 Thyrotoxicosis with diffuse goiter without thyrotoxic crisis or storm: Secondary | ICD-10-CM | POA: Diagnosis not present

## 2020-04-10 DIAGNOSIS — E0590 Thyrotoxicosis, unspecified without thyrotoxic crisis or storm: Secondary | ICD-10-CM | POA: Diagnosis not present

## 2020-05-06 DIAGNOSIS — D72819 Decreased white blood cell count, unspecified: Secondary | ICD-10-CM | POA: Diagnosis not present

## 2020-05-06 DIAGNOSIS — E0590 Thyrotoxicosis, unspecified without thyrotoxic crisis or storm: Secondary | ICD-10-CM | POA: Diagnosis not present

## 2020-05-09 DIAGNOSIS — Z77098 Contact with and (suspected) exposure to other hazardous, chiefly nonmedicinal, chemicals: Secondary | ICD-10-CM | POA: Diagnosis not present

## 2020-05-09 DIAGNOSIS — E0500 Thyrotoxicosis with diffuse goiter without thyrotoxic crisis or storm: Secondary | ICD-10-CM | POA: Diagnosis not present

## 2020-05-09 DIAGNOSIS — E0590 Thyrotoxicosis, unspecified without thyrotoxic crisis or storm: Secondary | ICD-10-CM | POA: Diagnosis not present

## 2020-05-09 DIAGNOSIS — Z6821 Body mass index (BMI) 21.0-21.9, adult: Secondary | ICD-10-CM | POA: Diagnosis not present

## 2020-05-10 DIAGNOSIS — D1801 Hemangioma of skin and subcutaneous tissue: Secondary | ICD-10-CM | POA: Diagnosis not present

## 2020-05-10 DIAGNOSIS — L821 Other seborrheic keratosis: Secondary | ICD-10-CM | POA: Diagnosis not present

## 2020-05-27 DIAGNOSIS — H00024 Hordeolum internum left upper eyelid: Secondary | ICD-10-CM | POA: Diagnosis not present

## 2020-05-30 DIAGNOSIS — Z20822 Contact with and (suspected) exposure to covid-19: Secondary | ICD-10-CM | POA: Diagnosis not present

## 2020-07-09 DIAGNOSIS — E0590 Thyrotoxicosis, unspecified without thyrotoxic crisis or storm: Secondary | ICD-10-CM | POA: Diagnosis not present

## 2020-07-16 DIAGNOSIS — E0590 Thyrotoxicosis, unspecified without thyrotoxic crisis or storm: Secondary | ICD-10-CM | POA: Diagnosis not present

## 2020-07-16 DIAGNOSIS — Z6821 Body mass index (BMI) 21.0-21.9, adult: Secondary | ICD-10-CM | POA: Diagnosis not present

## 2020-07-16 DIAGNOSIS — Z77098 Contact with and (suspected) exposure to other hazardous, chiefly nonmedicinal, chemicals: Secondary | ICD-10-CM | POA: Diagnosis not present

## 2020-07-16 DIAGNOSIS — E0500 Thyrotoxicosis with diffuse goiter without thyrotoxic crisis or storm: Secondary | ICD-10-CM | POA: Diagnosis not present

## 2020-07-20 ENCOUNTER — Encounter: Payer: Self-pay | Admitting: Cardiovascular Disease

## 2020-07-20 NOTE — Progress Notes (Signed)
Cardiology Office Note   Date:  07/23/2020   ID:  [name], DOB 1955/07/21, MRN [id number]  PCP:  [name], MD  Cardiologist:   Mertie Moores, MD   Chief Complaint  Patient presents with   Hyperlipidemia   Problem List: 1. Hypercholesterolemia 2. Family Hx of CAD  He's been quite active. She's back playing tennis and is doing well. She denies any chest pain or shortness breath.  She works out on a regular basis and is able to achieve a peak heart rate 185 160 without too much difficulty. She works out for 30 minutes at a time doing these exercises.  She's also walking in the evenings with her husband.   March 23, 2013:   [name] is doing OK.  She has stopped her Atrovastatin.  - she ran out and could not get back into see me.    Her LDL was 181 this last time.  Her previous LDL was 108.    She admits that her diet has not been as good and she has not been working out as much.   March 23, 2014:  [name] is a 65 y.o. female who presents for follow up of her hyperlipidemia.    She's done very well. She's exercising readily. She's lost a little bit of weight. She's not having any episodes of chest pain.  April 05, 2015:  [name] is seen today for follow up of her hyperlipdiemia. Staying active - golf and tennis regularly .  Walks regularly BP is up slighlty .  March 05, 2016:  Presents with stabbing like chest pain.  Quick jabs. At rest . Doesn't increase with activity. Has not been exercising as much.   Walking does not increase pain .   Not related to eating.  Not related to taking a deep breath.   Has gained some weight ( 10 lbs )  Has not been taking the atorvastatin as regularly  Has been present for 3 weeks .   Occurs almost daily .   May 04, 2017:  Doing well Working out regularly .  Feeling well  No chest pains or shortness of breath. Her last lipid levels were overall okay but her LDL is still 101.  I would like for her LDL to be around 6  since she has a family here history of early coronary disease in her mother. She admits that she does not take her atorvastatin every day and thinks she misses doses  Most  weekends.  Oct. 9, 2020 [name] is seen today for follow of her CP and hyperlipidemia. Has been busy with doctors visit s Playing golf and tennis .  No cp.  Hikes occasionally  Tolerating the crestor well  Sept. 23, 2021:  [name] is seen today for follow up  She has had persistent dyspnea. She was hospitalized a month ago covid test was negative. Right heart cath was normal She had a negative CT angiogram of the chest.  No evidence of pulmonary embolus.  She was thought to have asthma , started on inhalers,  These caused her to have tachycardia - felt jittery,  did not feel any better  PFTs were normal   She thinks her dyspnea  may be all due to allergies.    She stopped all her pulmonary meds several days ago .  Still on rosuvastatin   We have suggested cardiopulmonary stress testing at some point .  Nov. 16, 2021  [name] is seen today for  follow up of her worsening dyspnea She has been found to have Graves disease She had not improved despite being on Methimazol for 4 weeks and Metoprolol / Propranolol for 1-2 months   I have reviewed her echo and Coronary Ct angio from Aug., 2021  She is seen back today to make sure there is no cardiac component to her dyspnea.  Her mother had a hx of premature CAD  Started Methimazole on Oct. 20, 2021:  We started propranolol and then Metoprolol mid Oct.  She is on Metoprolol XL 25 mg a day .   We have encouraged her to take some propranolol along with the metoprolol if she has palpitations   She was given hydroxyzine by pulmonary yesterday  Has not been able to get it yet ( trouble at the pharmacy)    July 23, 2020:  [name] is seen today for follow up of her HLD and Graves disease. Outside records show a very elevated TSH several months ago  Her tapazole  dose is slowly being reduced.  TSH is now WNL  Doing well  Is off Toprol XL On Rosuvastatin 20 mg a day  Her coronary calcium score is 77 which places her in the 84th percentile for age and gender matched controls. LDL in Jan. 2021 was 74.   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   peicemeal polyp    Past Surgical History:  Procedure Laterality Date   BLADDER SURGERY     CHOLECYSTECTOMY  2012   laparoscopic   EUS  02/11/2011   Procedure: UPPER ENDOSCOPIC ULTRASOUND (EUS) LINEAR;  Surgeon: Landry Dyke, MD;  Location: [facility];  Service: Endoscopy;  Laterality: N/A;  mac   RIGHT HEART CATH N/A 08/15/2019   Procedure: RIGHT HEART CATH;  Surgeon: Jolaine Artist, MD;  Location: [facility];  Service: Cardiovascular;  Laterality: N/A;   US ECHOCARDIOGRAPHY  11/03/2006   EF 55-60%     Current Outpatient Medications  Medication Sig Dispense Refill   Cholecalciferol (VITAMIN D3) 50 MCG (2000 UT) TABS Take 2,000 Units by mouth daily.     esomeprazole (NEXIUM) 40 MG capsule Take 1 capsule by mouth in the morning and at bedtime.      hydrOXYzine (VISTARIL) 50 MG capsule Hydroxyzine 50 mg tablets - Can take 1/2 tablet, or 1 tablet, or 2 tablets every 8 hours as needed for anxiety/initiating sleep. Start with 25 mg (half tablet) initially to see how you tolerate. 120 capsule 1   ibuprofen (ADVIL,MOTRIN) 200 MG tablet Take 200-600 mg by mouth every 6 (six) hours as needed for headache, mild pain or moderate pain.      methimazole (TAPAZOLE) 10 MG tablet Take 1 tablet by mouth daily. After breakfast and after dinner     METRONIDAZOLE, TOPICAL, 0.75 % LOTN Apply 1 application topically every morning.     metoprolol succinate (TOPROL XL) 25 MG 24 hr tablet Take 1 tablet (25 mg total) by mouth daily.  (Patient not taking: Reported on 07/23/2020) 90 tablet 3   rosuvastatin (CRESTOR) 20 MG tablet TAKE 1 TABLET(20 MG) BY MOUTH DAILY 90 tablet 3   No current facility-administered medications for this visit.    Allergies:   Influenza vaccines    Social History:  The patient  reports that she has never smoked. She has never used smokeless tobacco. She reports that she does not drink alcohol and does not use drugs.   Family History:  The patient's family history includes Cancer in her paternal grandmother; Heart disease in her mother; Hypertension in her father and mother.    ROS: Noted in current history, otherwise negative.    Physical Exam: Blood pressure 116/76, pulse 62, height _0  (1.676 m), weight 138 lb 6.4 oz (62.8 kg), SpO2 99 %.  GEN:  Well nourished, well developed in no acute distress HEENT: Normal NECK: No JVD; No carotid bruits LYMPHATICS: No lymphadenopathy CARDIAC: RRR , soft systolic murmur  RESPIRATORY:  Clear to auscultation without rales, wheezing or rhonchi  ABDOMEN: Soft, non-tender, non-distended MUSCULOSKELETAL:  No edema; No deformity  SKIN: Warm and dry NEUROLOGIC:  Alert and oriented x 3    EKG:         Recent Labs: 08/14/2019: BUN 20; Creatinine, Ser 0.78 08/15/2019: Potassium 3.0; Sodium 144 09/19/2019: Hemoglobin 13.5; Platelets 339.0    Lipid Panel    Component Value Date/Time   CHOL 150 01/20/2019 1103   TRIG 74 01/20/2019 1103   HDL 62 01/20/2019 1103   CHOLHDL 2.4 01/20/2019 1103   CHOLHDL 2.7 04/04/2015 0905   VLDL 14 04/04/2015 0905   LDLCALC 74 01/20/2019 1103   LDLDIRECT 147.6 01/27/2011 0902      Wt Readings from Last 3 Encounters:  07/23/20 138 lb 6.4 oz (62.8 kg)  12/18/19 135 lb 6.4 oz (61.4 kg)  11/22/19 132 lb (59.9 kg)      Other studies Reviewed: Additional studies/ records that were reviewed today include: . Review of the above records demonstrates:    ASSESSMENT AND PLAN:      2.  Hyperlipidemia: Her  last LDL was 74.  She is on rosuvastatin 20 mg a day.  We will check fasting lipids today.  Also check ALT and basic metabolic profile.  Her goal LDL is between 50 and 70.Marland Kitchen  Her coronary calcium score is 77 which places her in the 84th percentile for age and gender matched controls.  3.  Thyroid disease:     Current medicines are reviewed at length with the patient today.  The patient does not have concerns regarding medicines.  The following changes have been made:  no change  Labs/ tests ordered today include:   No orders of the defined types were placed in this encounter.    Disposition:  1 year office visit    Signed, Mertie Moores, MD  07/23/2020 11:23 AM    [address], [place], [place]  [address] Phone: [phone]; Fax: [phone]

## 2020-07-23 ENCOUNTER — Ambulatory Visit: Payer: BC Managed Care – PPO | Admitting: Cardiovascular Disease

## 2020-07-23 ENCOUNTER — Other Ambulatory Visit: Payer: Self-pay

## 2020-07-23 ENCOUNTER — Encounter: Payer: Self-pay | Admitting: Cardiovascular Disease

## 2020-07-23 VITALS — BP 116/76 | HR 62 | Ht 66.0 in | Wt 138.4 lb

## 2020-07-23 DIAGNOSIS — E0500 Thyrotoxicosis with diffuse goiter without thyrotoxic crisis or storm: Secondary | ICD-10-CM

## 2020-07-23 DIAGNOSIS — E782 Mixed hyperlipidemia: Secondary | ICD-10-CM

## 2020-07-23 DIAGNOSIS — R0602 Shortness of breath: Secondary | ICD-10-CM | POA: Diagnosis not present

## 2020-07-23 LAB — HEPATIC FUNCTION PANEL
ALT: 13 IU/L (ref 0–32)
AST: 15 IU/L (ref 0–40)
Albumin: 4.4 g/dL (ref 3.8–4.8)
Alkaline Phosphatase: 117 IU/L (ref 44–121)
Bilirubin Total: 0.4 mg/dL (ref 0.0–1.2)
Bilirubin, Direct: 0.14 mg/dL (ref 0.00–0.40)
Total Protein: 6.3 g/dL (ref 6.0–8.5)

## 2020-07-23 LAB — LIPID PANEL
Chol/HDL Ratio: 2.9 ratio (ref 0.0–4.4)
Cholesterol, Total: 168 mg/dL (ref 100–199)
HDL: 57 mg/dL (ref >39)
LDL Chol Calc (NIH): 88 mg/dL (ref 0–99)
Triglycerides: 133 mg/dL (ref 0–149)
VLDL Cholesterol Cal: 23 mg/dL (ref 5–40)

## 2020-07-23 LAB — BASIC METABOLIC PANEL
BUN/Creatinine Ratio: 14 (ref 12–28)
BUN: 14 mg/dL (ref 8–27)
CO2: 23 mmol/L (ref 20–29)
Calcium: 9.6 mg/dL (ref 8.7–10.3)
Chloride: 102 mmol/L (ref 96–106)
Creatinine, Ser: 1.03 mg/dL — ABNORMAL HIGH (ref 0.57–1.00)
Glucose: 83 mg/dL (ref 65–99)
Potassium: 4 mmol/L (ref 3.5–5.2)
Sodium: 138 mmol/L (ref 134–144)
eGFR: 61 mL/min/{1.73_m2} (ref >59)

## 2020-07-23 MED ORDER — ROSUVASTATIN CALCIUM 20 MG PO TABS
ORAL_TABLET | ORAL | 3 refills | Status: DC
Start: 2020-07-23 — End: 2021-09-03

## 2020-07-23 NOTE — Patient Instructions (Signed)
Medication Instructions:  No changes *If you need a refill on your cardiac medications before your next appointment, please call your pharmacy*   Lab Work: Today: lipids, alt, bmp  If you have labs (blood work) drawn today and your tests are completely normal, you will receive your results only by: [place] (if you have MyChart) OR A paper copy in the mail If you have any lab test that is abnormal or we need to change your treatment, we will call you to review the results.   Testing/Procedures: none   Follow-Up: At [facility], you and your health needs are our priority.  As part of our continuing mission to provide you with exceptional heart care, we have created designated Provider Care Teams.  These Care Teams include your primary Cardiologist (physician) and Advanced Practice Providers (APPs -  Physician Assistants and Nurse Practitioners) who all work together to provide you with the care you need, when you need it.   Your next appointment:   12 month(s)  The format for your next appointment:   In Person  Provider:   You may see Mertie Moores, MD or one of the following Advanced Practice Providers on your designated Care Team:   Richardson Dopp, PA-C Robbie Lis, [place]   Other Instructions

## 2020-07-24 ENCOUNTER — Telehealth: Payer: Self-pay

## 2020-07-24 DIAGNOSIS — E782 Mixed hyperlipidemia: Secondary | ICD-10-CM

## 2020-07-24 MED ORDER — EZETIMIBE 10 MG PO TABS: 10.0000 mg | ORAL_TABLET | Freq: Every day | ORAL | 3 refills | Status: DC

## 2020-07-24 NOTE — Telephone Encounter (Signed)
The patient has been notified of the result and verbalized understanding.  All questions (if any) were answered. Antonieta Iba, RN 07/24/2020 12:52 PM  Rx has been sent in. Lab work has been scheduled.

## 2020-07-24 NOTE — Telephone Encounter (Signed)
-----   Message from Thayer Headings, MD sent at 07/23/2020  5:41 PM EDT ----- LDL is 88.  Her goal is 50-70 Lets add Zetia 10 mg a day  Recheck lipids and ALT in 2-3 months  Creatinine is mildly elevated.  I suspect this may be due to some volume depletion today .   Will continue to follow .

## 2020-08-28 ENCOUNTER — Encounter: Payer: Self-pay | Admitting: Podiatry

## 2020-08-28 ENCOUNTER — Ambulatory Visit: Payer: BC Managed Care – PPO | Admitting: Podiatry

## 2020-08-28 ENCOUNTER — Other Ambulatory Visit: Payer: Self-pay

## 2020-08-28 DIAGNOSIS — M778 Other enthesopathies, not elsewhere classified: Secondary | ICD-10-CM

## 2020-08-28 DIAGNOSIS — D72819 Decreased white blood cell count, unspecified: Secondary | ICD-10-CM | POA: Insufficient documentation

## 2020-08-28 DIAGNOSIS — I10 Essential (primary) hypertension: Secondary | ICD-10-CM

## 2020-08-28 DIAGNOSIS — M810 Age-related osteoporosis without current pathological fracture: Secondary | ICD-10-CM | POA: Insufficient documentation

## 2020-08-28 DIAGNOSIS — E0590 Thyrotoxicosis, unspecified without thyrotoxic crisis or storm: Secondary | ICD-10-CM | POA: Insufficient documentation

## 2020-08-28 DIAGNOSIS — F439 Reaction to severe stress, unspecified: Secondary | ICD-10-CM | POA: Insufficient documentation

## 2020-08-28 HISTORY — DX: Essential (primary) hypertension: I10

## 2020-08-30 NOTE — Progress Notes (Signed)
Subjective:   Patient ID: [name], female   DOB: 65 y.o.   MRN: [id number]   HPI Patient presents stating she has had a sense of pressure and has had a history of pain in the top of her foot right over left does not remember injuries but is concerned about it and she is going on a trip and she wants to make sure nothing else is going on.  Patient does not smoke likes to be active   Review of Systems  All other systems reviewed and are negative.      Objective:  Physical Exam Vitals and nursing note reviewed.  Constitutional:      Appearance: She is well-developed.  Pulmonary:     Effort: Pulmonary effort is normal.  Musculoskeletal:        General: Normal range of motion.  Skin:    General: Skin is warm.  Neurological:     Mental Status: She is alert.    Neurovascular status intact muscle strength found to be adequate range of motion adequate with patient found to have inflammation of a low-grade nature top of her right foot with moderate swelling associated with it and some in enlargement of a natural phenomenon within the tissue.  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  If symptoms get worse we will have to consider more aggressive treatment but at this point we will just get a try simple and see how it responds

## 2020-10-01 DIAGNOSIS — K219 Gastro-esophageal reflux disease without esophagitis: Secondary | ICD-10-CM | POA: Diagnosis not present

## 2020-10-01 DIAGNOSIS — K117 Disturbances of salivary secretion: Secondary | ICD-10-CM | POA: Diagnosis not present

## 2020-10-07 ENCOUNTER — Other Ambulatory Visit: Payer: BC Managed Care – PPO

## 2020-10-10 DIAGNOSIS — E0590 Thyrotoxicosis, unspecified without thyrotoxic crisis or storm: Secondary | ICD-10-CM | POA: Diagnosis not present

## 2020-10-11 ENCOUNTER — Other Ambulatory Visit: Payer: Self-pay

## 2020-10-11 ENCOUNTER — Other Ambulatory Visit: Payer: BC Managed Care – PPO | Admitting: *Deleted

## 2020-10-11 DIAGNOSIS — E782 Mixed hyperlipidemia: Secondary | ICD-10-CM

## 2020-10-11 LAB — LIPID PANEL
Chol/HDL Ratio: 1.9 ratio (ref 0.0–4.4)
Cholesterol, Total: 121 mg/dL (ref 100–199)
HDL: 63 mg/dL (ref >39)
LDL Chol Calc (NIH): 47 mg/dL (ref 0–99)
Triglycerides: 47 mg/dL (ref 0–149)
VLDL Cholesterol Cal: 11 mg/dL (ref 5–40)

## 2020-10-11 LAB — ALT: ALT: 17 IU/L (ref 0–32)

## 2020-10-16 DIAGNOSIS — Z77098 Contact with and (suspected) exposure to other hazardous, chiefly nonmedicinal, chemicals: Secondary | ICD-10-CM | POA: Diagnosis not present

## 2020-10-16 DIAGNOSIS — E0590 Thyrotoxicosis, unspecified without thyrotoxic crisis or storm: Secondary | ICD-10-CM | POA: Diagnosis not present

## 2020-10-16 DIAGNOSIS — E0500 Thyrotoxicosis with diffuse goiter without thyrotoxic crisis or storm: Secondary | ICD-10-CM | POA: Diagnosis not present

## 2020-10-16 DIAGNOSIS — Z6821 Body mass index (BMI) 21.0-21.9, adult: Secondary | ICD-10-CM | POA: Diagnosis not present

## 2020-10-25 ENCOUNTER — Telehealth: Payer: Self-pay | Admitting: Cardiovascular Disease

## 2020-10-25 NOTE — Telephone Encounter (Signed)
Follow Up:    Pt is returning call from  , concerning her lab results. From 10-11-20.

## 2020-10-25 NOTE — Telephone Encounter (Signed)
Spoke with pt and advised per Dr Acie Fredrickson Lipids, ALT,  look great. Cont current meds.  Pt verbalizes understanding and thanked Therapist, sports for the call.

## 2020-10-30 DIAGNOSIS — E559 Vitamin D deficiency, unspecified: Secondary | ICD-10-CM | POA: Diagnosis not present

## 2020-10-30 DIAGNOSIS — L720 Epidermal cyst: Secondary | ICD-10-CM | POA: Diagnosis not present

## 2020-10-30 DIAGNOSIS — E041 Nontoxic single thyroid nodule: Secondary | ICD-10-CM | POA: Diagnosis not present

## 2020-10-30 DIAGNOSIS — L819 Disorder of pigmentation, unspecified: Secondary | ICD-10-CM | POA: Diagnosis not present

## 2020-10-30 DIAGNOSIS — D2272 Melanocytic nevi of left lower limb, including hip: Secondary | ICD-10-CM | POA: Diagnosis not present

## 2020-10-30 DIAGNOSIS — L718 Other rosacea: Secondary | ICD-10-CM | POA: Diagnosis not present

## 2020-10-30 DIAGNOSIS — E785 Hyperlipidemia, unspecified: Secondary | ICD-10-CM | POA: Diagnosis not present

## 2020-11-01 DIAGNOSIS — Z1339 Encounter for screening examination for other mental health and behavioral disorders: Secondary | ICD-10-CM | POA: Diagnosis not present

## 2020-11-01 DIAGNOSIS — Z1331 Encounter for screening for depression: Secondary | ICD-10-CM | POA: Diagnosis not present

## 2020-11-01 DIAGNOSIS — Z0000 Encounter for general adult medical examination without abnormal findings: Secondary | ICD-10-CM | POA: Diagnosis not present

## 2020-11-01 DIAGNOSIS — E0500 Thyrotoxicosis with diffuse goiter without thyrotoxic crisis or storm: Secondary | ICD-10-CM | POA: Diagnosis not present

## 2021-01-23 DIAGNOSIS — Z01419 Encounter for gynecological examination (general) (routine) without abnormal findings: Secondary | ICD-10-CM | POA: Diagnosis not present

## 2021-01-23 DIAGNOSIS — Z1231 Encounter for screening mammogram for malignant neoplasm of breast: Secondary | ICD-10-CM | POA: Diagnosis not present

## 2021-01-23 DIAGNOSIS — Z6823 Body mass index (BMI) 23.0-23.9, adult: Secondary | ICD-10-CM | POA: Diagnosis not present

## 2021-01-27 DIAGNOSIS — E0590 Thyrotoxicosis, unspecified without thyrotoxic crisis or storm: Secondary | ICD-10-CM | POA: Diagnosis not present

## 2021-01-27 DIAGNOSIS — Z6821 Body mass index (BMI) 21.0-21.9, adult: Secondary | ICD-10-CM | POA: Diagnosis not present

## 2021-01-27 DIAGNOSIS — E0500 Thyrotoxicosis with diffuse goiter without thyrotoxic crisis or storm: Secondary | ICD-10-CM | POA: Diagnosis not present

## 2021-01-27 DIAGNOSIS — Z77098 Contact with and (suspected) exposure to other hazardous, chiefly nonmedicinal, chemicals: Secondary | ICD-10-CM | POA: Diagnosis not present

## 2021-01-30 DIAGNOSIS — E0590 Thyrotoxicosis, unspecified without thyrotoxic crisis or storm: Secondary | ICD-10-CM | POA: Diagnosis not present

## 2021-05-08 DIAGNOSIS — E0590 Thyrotoxicosis, unspecified without thyrotoxic crisis or storm: Secondary | ICD-10-CM | POA: Diagnosis not present

## 2021-05-15 DIAGNOSIS — E0590 Thyrotoxicosis, unspecified without thyrotoxic crisis or storm: Secondary | ICD-10-CM | POA: Diagnosis not present

## 2021-05-15 DIAGNOSIS — Z13820 Encounter for screening for osteoporosis: Secondary | ICD-10-CM | POA: Diagnosis not present

## 2021-05-15 DIAGNOSIS — Z6821 Body mass index (BMI) 21.0-21.9, adult: Secondary | ICD-10-CM | POA: Diagnosis not present

## 2021-05-15 DIAGNOSIS — Z77098 Contact with and (suspected) exposure to other hazardous, chiefly nonmedicinal, chemicals: Secondary | ICD-10-CM | POA: Diagnosis not present

## 2021-05-15 DIAGNOSIS — E0500 Thyrotoxicosis with diffuse goiter without thyrotoxic crisis or storm: Secondary | ICD-10-CM | POA: Diagnosis not present

## 2021-05-17 ENCOUNTER — Other Ambulatory Visit: Payer: Self-pay | Admitting: Cardiovascular Disease

## 2021-05-19 NOTE — Telephone Encounter (Signed)
Pt's medication was sent to pt's pharmacy as requested. Confirmation received.  °

## 2021-07-14 ENCOUNTER — Other Ambulatory Visit: Payer: Self-pay | Admitting: Cardiovascular Disease

## 2021-07-15 NOTE — Telephone Encounter (Signed)
ezetimibe (ZETIA) 10 MG tablet Medication Date: 05/19/2021 Department: [address] Office Ordering/Authorizing: Nahser, Wonda Cheng, MD   Order Providers  Prescribing Provider Encounter Provider  Nahser, Wonda Cheng, MD Nahser, Wonda Cheng, MD   Outpatient Medication Detail   Disp Refills Start End   ezetimibe (ZETIA) 10 MG tablet 90 tablet 0 05/19/2021    Sig: TAKE 1 TABLET(10 MG) BY MOUTH DAILY   Sent to pharmacy as: ezetimibe (ZETIA) 10 MG tablet   Notes to Pharmacy: Please call our office to schedule an yearly appointment with Dr. Acie Fredrickson for July 2023 for future refills. [phone]. Thank you 1st attempt   E-Prescribing Status: Receipt confirmed by pharmacy (05/19/2021  1:08 PM EDT)    [place], [place] [address] AT [place] Encounter  Priority and Order Details

## 2021-07-30 ENCOUNTER — Telehealth: Payer: Self-pay | Admitting: Cardiovascular Disease

## 2021-07-30 DIAGNOSIS — E782 Mixed hyperlipidemia: Secondary | ICD-10-CM

## 2021-07-30 DIAGNOSIS — Z79899 Other long term (current) drug therapy: Secondary | ICD-10-CM

## 2021-07-30 NOTE — Telephone Encounter (Signed)
Routine labs ordered at this time. Reached out to patient and scheduled lab appt for this Friday 08/01/21 (lipids, alt, bmet)

## 2021-07-30 NOTE — Telephone Encounter (Signed)
New Message:    Patient says she have an appointment on Monday(08-04-21). She says she would like to have lab work this week before her appointment. She would like for you to put in an order today please.

## 2021-08-01 ENCOUNTER — Other Ambulatory Visit: Payer: BC Managed Care – PPO

## 2021-08-01 DIAGNOSIS — Z79899 Other long term (current) drug therapy: Secondary | ICD-10-CM | POA: Diagnosis not present

## 2021-08-01 DIAGNOSIS — I2510 Atherosclerotic heart disease of native coronary artery without angina pectoris: Secondary | ICD-10-CM | POA: Diagnosis not present

## 2021-08-01 DIAGNOSIS — E782 Mixed hyperlipidemia: Secondary | ICD-10-CM | POA: Diagnosis not present

## 2021-08-01 LAB — BASIC METABOLIC PANEL
BUN/Creatinine Ratio: 24 (ref 12–28)
BUN: 21 mg/dL (ref 8–27)
CO2: 24 mmol/L (ref 20–29)
Calcium: 9.1 mg/dL (ref 8.7–10.3)
Chloride: 101 mmol/L (ref 96–106)
Creatinine, Ser: 0.86 mg/dL (ref 0.57–1.00)
Glucose: 87 mg/dL (ref 70–99)
Potassium: 4.2 mmol/L (ref 3.5–5.2)
Sodium: 138 mmol/L (ref 134–144)
eGFR: 75 mL/min/{1.73_m2} (ref >59)

## 2021-08-01 LAB — LIPID PANEL
Chol/HDL Ratio: 2.4 ratio (ref 0.0–4.4)
Cholesterol, Total: 142 mg/dL (ref 100–199)
HDL: 60 mg/dL (ref >39)
LDL Chol Calc (NIH): 62 mg/dL (ref 0–99)
Triglycerides: 109 mg/dL (ref 0–149)
VLDL Cholesterol Cal: 20 mg/dL (ref 5–40)

## 2021-08-01 LAB — ALT: ALT: 18 IU/L (ref 0–32)

## 2021-08-03 ENCOUNTER — Encounter: Payer: Self-pay | Admitting: Cardiovascular Disease

## 2021-08-03 NOTE — Progress Notes (Unsigned)
Cardiology Office Note   Date:  08/04/2021   ID:  [name], DOB [birth year], MRN [id number]  PCP:  [name], MD  Cardiologist:   Mertie Moores, MD   Chief Complaint  Patient presents with   Hyperlipidemia   coronary calcifications   Problem List: 1. Hypercholesterolemia 2. Family Hx of CAD  He's been quite active. She's back playing tennis and is doing well. She denies any chest pain or shortness breath.  She works out on a regular basis and is able to achieve a peak heart rate 185 160 without too much difficulty. She works out for 30 minutes at a time doing these exercises.  She's also walking in the evenings with her husband.   March 23, 2013:   [name] is doing OK.  She has stopped her Atrovastatin.  - she ran out and could not get back into see me.    Her LDL was 181 this last time.  Her previous LDL was 108.    She admits that her diet has not been as good and she has not been working out as much.   March 23, 2014:  [name] is a 66 y.o. female who presents for follow up of her hyperlipidemia.    She's done very well. She's exercising readily. She's lost a little bit of weight. She's not having any episodes of chest pain.  April 05, 2015:  [name] is seen today for follow up of her hyperlipdiemia. Staying active - golf and tennis regularly .  Walks regularly BP is up slighlty .  March 05, 2016:  Presents with stabbing like chest pain.  Quick jabs. At rest . Doesn't increase with activity. Has not been exercising as much.   Walking does not increase pain .   Not related to eating.  Not related to taking a deep breath.   Has gained some weight ( 10 lbs )  Has not been taking the atorvastatin as regularly  Has been present for 3 weeks .   Occurs almost daily .   May 04, 2017:  Doing well Working out regularly .  Feeling well  No chest pains or shortness of breath. Her last lipid levels were overall okay but her LDL is still 101.  I would like  for her LDL to be around 73 since she has a family here history of early coronary disease in her mother. She admits that she does not take her atorvastatin every day and thinks she misses doses  Most  weekends.  Oct. 9, 2020 [name] is seen today for follow of her CP and hyperlipidemia. Has been busy with doctors visit s Playing golf and tennis .  No cp.  Hikes occasionally  Tolerating the crestor well  Sept. 23, 2021:  [name] is seen today for follow up  She has had persistent dyspnea. She was hospitalized a month ago covid test was negative. Right heart cath was normal She had a negative CT angiogram of the chest.  No evidence of pulmonary embolus.  She was thought to have asthma , started on inhalers,  These caused her to have tachycardia - felt jittery,  did not feel any better  PFTs were normal   She thinks her dyspnea  may be all due to allergies.    She stopped all her pulmonary meds several days ago .  Still on rosuvastatin   We have suggested cardiopulmonary stress testing at some point .  Nov. 16, 2021  [name]  is seen today for follow up of her worsening dyspnea She has been found to have Graves disease She had not improved despite being on Methimazol for 4 weeks and Metoprolol / Propranolol for 1-2 months   I have reviewed her echo and Coronary Ct angio from Aug., 2021  She is seen back today to make sure there is no cardiac component to her dyspnea.  Her mother had a hx of premature CAD  Started Methimazole on Oct. 20, 2021:  We started propranolol and then Metoprolol mid Oct.  She is on Metoprolol XL 25 mg a day .   We have encouraged her to take some propranolol along with the metoprolol if she has palpitations   She was given hydroxyzine by pulmonary yesterday  Has not been able to get it yet ( trouble at the pharmacy)    July 23, 2020:  [name] is seen today for follow up of her HLD and Graves disease. Outside records show a very elevated TSH  several months ago  Her tapazole dose is slowly being reduced.  TSH is now WNL  Doing well  Is off Toprol XL On Rosuvastatin 20 mg a day  Her coronary calcium score is 77 which places her in the 84th percentile for age and gender matched controls. LDL in Jan. 2021 was 52.   August 04, 2021 [name] is seen today for follow up of her HLD, coronary calcifications, graves disease.  Coronary CTA from 08/14/19 Coronary calcium score is 77, which places the patient in the 84th percentile for age and sex matched control. RCA - normal  LM - mild plaque LAD mild - mod disease Ramus - normal LCx - small and non dominate  Normal sized aorta   Labs from 08/01/21 were reviewed LDL is 62 HDL is 60 Trigs are 109   Was on zetia 10 mg a day but ran out 2 weeks ago .    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   peicemeal polyp    Past Surgical History:  Procedure Laterality Date   BLADDER SURGERY     CHOLECYSTECTOMY  2012   laparoscopic   EUS  02/11/2011   Procedure: UPPER ENDOSCOPIC ULTRASOUND (EUS) LINEAR;  Surgeon: Landry Dyke, MD;  Location: [facility];  Service: Endoscopy;  Laterality: N/A;  mac   RIGHT HEART CATH N/A 08/15/2019   Procedure: RIGHT HEART CATH;  Surgeon: Jolaine Artist, MD;  Location: [facility];  Service: Cardiovascular;  Laterality: N/A;   US ECHOCARDIOGRAPHY  11/03/2006   EF 55-60%     Current Outpatient Medications  Medication Sig Dispense Refill   Cholecalciferol (VITAMIN D3) 50 MCG (2000 UT) TABS Take 2,000 Units by mouth daily.     ezetimibe (ZETIA) 10 MG tablet Take 1 tablet (10 mg total) by mouth daily. 90 tablet 0   ibuprofen  (ADVIL,MOTRIN) 200 MG tablet Take 200-600 mg by mouth every 6 (six) hours as needed for headache, mild pain or moderate pain.      methimazole (TAPAZOLE) 10 MG tablet Take 1 tablet by mouth daily. After breakfast and after dinner  rosuvastatin (CRESTOR) 20 MG tablet TAKE 1 TABLET(20 MG) BY MOUTH DAILY 90 tablet 3   No current facility-administered medications for this visit.    Allergies:   Influenza vaccines    Social History:  The patient  reports that she has never smoked. She has never used smokeless tobacco. She reports that she does not drink alcohol and does not use drugs.   Family History:  The patient's family history includes Cancer in her paternal grandmother; Heart disease in her mother; Hypertension in her father and mother.    ROS: Noted in current history, otherwise negative.    Physical Exam: Blood pressure 105/78, pulse (!) 55, height _0  (1.676 m), weight 148 lb (67.1 kg), SpO2 98 %.  GEN:  Well nourished, well developed in no acute distress HEENT: Normal NECK: No JVD; No carotid bruits LYMPHATICS: No lymphadenopathy CARDIAC: RRR , soft systolic murmur  RESPIRATORY:  Clear to auscultation without rales, wheezing or rhonchi  ABDOMEN: Soft, non-tender, non-distended MUSCULOSKELETAL:  No edema; No deformity  SKIN: Warm and dry NEUROLOGIC:  Alert and oriented x 3    EKG:     August 04, 2021: Sinus bradycardia at 55.      Recent Labs: 08/01/2021: ALT 18; BUN 21; Creatinine, Ser 0.86; Potassium 4.2; Sodium 138    Lipid Panel    Component Value Date/Time   CHOL 142 08/01/2021 0812   TRIG 109 08/01/2021 0812   HDL 60 08/01/2021 0812   CHOLHDL 2.4 08/01/2021 0812   CHOLHDL 2.7 04/04/2015 0905   VLDL 14 04/04/2015 0905   LDLCALC 62 08/01/2021 0812   LDLDIRECT 147.6 01/27/2011 0902      Wt Readings from Last 3 Encounters:  08/04/21 148 lb (67.1 kg)  07/23/20 138 lb 6.4 oz (62.8 kg)  12/18/19 135 lb 6.4 oz (61.4 kg)      Other studies  Reviewed: Additional studies/ records that were reviewed today include: . Review of the above records demonstrates:    ASSESSMENT AND PLAN:  1.  Coronary artery disease: Kelita has a history of mild to moderate coronary artery disease by coronary CT angiogram.  She also has coronary calcifications.  She has a strong family history of premature coronary artery disease.  No angina .  Cont aggressive lipid management      2.  Hyperlipidemia:  Her LDL goal is 50-55.  She is currently on rosuvastatin 20 mg a day.  She was on Zetia 10 mg a day but ran out of the Zetia 2 weeks prior to her next office visit/blood draw.  We will refill her Zetia 10 mg a day.  We will check a proBNP, lipid profile, ALT in 6 weeks.  If she still not at goal I think that it would be important to get her on a PCSK9 inhibitor.  3.  Thyroid disease:  will need to have a thyroid ablation at some point      Current medicines are reviewed at length with the patient today.  The patient does not have concerns regarding medicines.  The following changes have been made:  no change  Labs/ tests ordered today include:   Orders Placed This Encounter  Procedures   ALT   Basic metabolic panel   Lipid panel   Apolipoprotein B   EKG 12-Lead      Disposition:  1 year office visit    Signed, Mertie Moores, MD  08/04/2021 5:16 PM    [place]  [address], [place], [place]  [address] Phone: [phone]; Fax: [phone]

## 2021-08-04 ENCOUNTER — Encounter: Payer: Self-pay | Admitting: Cardiovascular Disease

## 2021-08-04 ENCOUNTER — Ambulatory Visit: Payer: BC Managed Care – PPO | Admitting: Cardiovascular Disease

## 2021-08-04 VITALS — BP 105/78 | HR 55 | Ht 66.0 in | Wt 148.0 lb

## 2021-08-04 DIAGNOSIS — I2510 Atherosclerotic heart disease of native coronary artery without angina pectoris: Secondary | ICD-10-CM

## 2021-08-04 DIAGNOSIS — E782 Mixed hyperlipidemia: Secondary | ICD-10-CM

## 2021-08-04 DIAGNOSIS — R0609 Other forms of dyspnea: Secondary | ICD-10-CM | POA: Diagnosis not present

## 2021-08-04 DIAGNOSIS — Z8249 Family history of ischemic heart disease and other diseases of the circulatory system: Secondary | ICD-10-CM

## 2021-08-04 MED ORDER — EZETIMIBE 10 MG PO TABS: 10.0000 mg | ORAL_TABLET | Freq: Every day | ORAL | 0 refills | Status: DC

## 2021-08-04 NOTE — Patient Instructions (Signed)
Medication Instructions:  RESTART ZETIA 10 MG 1 TAB EVERY DAY  *If you need a refill on your cardiac medications before your next appointment, please call your pharmacy*   Lab Work: 6 WEEKS  ALT BMET APOB AND LIPID  If you have labs (blood work) drawn today and your tests are completely normal, you will receive your results only by: [place] (if you have MyChart) OR A paper copy in the mail If you have any lab test that is abnormal or we need to change your treatment, we will call you to review the results.   Testing/Procedures: NONE   Follow-Up: At [facility], you and your health needs are our priority.  As part of our continuing mission to provide you with exceptional heart care, we have created designated Provider Care Teams.  These Care Teams include your primary Cardiologist (physician) and Advanced Practice Providers (APPs -  Physician Assistants and Nurse Practitioners) who all work together to provide you with the care you need, when you need it.  We recommend signing up for the patient portal called "MyChart".  Sign up information is provided on this After Visit Summary.  MyChart is used to connect with patients for Virtual Visits (Telemedicine).  Patients are able to view lab/test results, encounter notes, upcoming appointments, etc.  Non-urgent messages can be sent to your provider as well.   To learn more about what you can do with MyChart, go to [email or web address].    Your next appointment:   1 year(s)  The format for your next appointment:   In Person  Provider:   Mertie Moores, MD      Other Instructions NONE  Important Information About Sugar

## 2021-08-05 LAB — LIPOPROTEIN A (LPA): Lipoprotein (a): 37.5 nmol/L (ref <75.0)

## 2021-08-05 LAB — SPECIMEN STATUS REPORT

## 2021-08-06 ENCOUNTER — Telehealth: Payer: Self-pay

## 2021-08-06 DIAGNOSIS — Z79899 Other long term (current) drug therapy: Secondary | ICD-10-CM

## 2021-08-06 NOTE — Telephone Encounter (Signed)
-----   Message from Thayer Headings, MD sent at 08/05/2021  2:17 PM EDT ----- The Lipoprotein (a) is normal Lets see how you do with the addition of Zetia and the recheck of lipids and Apo B in several months.

## 2021-08-06 NOTE — Telephone Encounter (Signed)
Reached out to patient to schedule labs. Dr Acie Fredrickson would like her to come back in 6-8 weeks (not several months as stated) for repeat labs. Left vmail informing her that I placed her on lab schedule for 9/27 and to call if this date is not convenient for her.

## 2021-08-14 DIAGNOSIS — E0590 Thyrotoxicosis, unspecified without thyrotoxic crisis or storm: Secondary | ICD-10-CM | POA: Diagnosis not present

## 2021-08-21 DIAGNOSIS — E0590 Thyrotoxicosis, unspecified without thyrotoxic crisis or storm: Secondary | ICD-10-CM | POA: Diagnosis not present

## 2021-08-21 DIAGNOSIS — E0500 Thyrotoxicosis with diffuse goiter without thyrotoxic crisis or storm: Secondary | ICD-10-CM | POA: Diagnosis not present

## 2021-09-01 ENCOUNTER — Other Ambulatory Visit: Payer: Self-pay | Admitting: Home Modifications

## 2021-09-01 ENCOUNTER — Other Ambulatory Visit (HOSPITAL_COMMUNITY): Payer: Self-pay | Admitting: Home Modifications

## 2021-09-01 DIAGNOSIS — E0500 Thyrotoxicosis with diffuse goiter without thyrotoxic crisis or storm: Secondary | ICD-10-CM

## 2021-09-03 ENCOUNTER — Other Ambulatory Visit: Payer: Self-pay | Admitting: Cardiovascular Disease

## 2021-09-15 ENCOUNTER — Other Ambulatory Visit: Payer: BC Managed Care – PPO

## 2021-10-01 ENCOUNTER — Other Ambulatory Visit: Payer: BC Managed Care – PPO

## 2021-10-01 ENCOUNTER — Ambulatory Visit: Payer: 59 | Attending: Cardiovascular Disease

## 2021-10-01 DIAGNOSIS — Z8249 Family history of ischemic heart disease and other diseases of the circulatory system: Secondary | ICD-10-CM

## 2021-10-01 DIAGNOSIS — I2510 Atherosclerotic heart disease of native coronary artery without angina pectoris: Secondary | ICD-10-CM

## 2021-10-01 DIAGNOSIS — E782 Mixed hyperlipidemia: Secondary | ICD-10-CM

## 2021-10-02 LAB — LIPID PANEL
Chol/HDL Ratio: 2.2 ratio (ref 0.0–4.4)
Cholesterol, Total: 112 mg/dL (ref 100–199)
HDL: 51 mg/dL (ref >39)
LDL Chol Calc (NIH): 47 mg/dL (ref 0–99)
Triglycerides: 62 mg/dL (ref 0–149)
VLDL Cholesterol Cal: 14 mg/dL (ref 5–40)

## 2021-10-02 LAB — BASIC METABOLIC PANEL
BUN/Creatinine Ratio: 16 (ref 12–28)
BUN: 15 mg/dL (ref 8–27)
CO2: 24 mmol/L (ref 20–29)
Calcium: 9.3 mg/dL (ref 8.7–10.3)
Chloride: 103 mmol/L (ref 96–106)
Creatinine, Ser: 0.94 mg/dL (ref 0.57–1.00)
Glucose: 85 mg/dL (ref 70–99)
Potassium: 4.6 mmol/L (ref 3.5–5.2)
Sodium: 140 mmol/L (ref 134–144)
eGFR: 68 mL/min/{1.73_m2} (ref >59)

## 2021-10-02 LAB — ALT: ALT: 24 IU/L (ref 0–32)

## 2021-10-02 LAB — APOLIPOPROTEIN B: Apolipoprotein B: 51 mg/dL (ref <90)

## 2021-10-16 ENCOUNTER — Encounter (HOSPITAL_COMMUNITY): Payer: 59

## 2021-10-16 ENCOUNTER — Encounter (HOSPITAL_COMMUNITY): Payer: Self-pay

## 2021-10-17 ENCOUNTER — Encounter (HOSPITAL_COMMUNITY): Payer: 59

## 2021-10-17 ENCOUNTER — Encounter (HOSPITAL_COMMUNITY): Payer: Self-pay

## 2021-11-20 ENCOUNTER — Encounter (HOSPITAL_COMMUNITY): Payer: Self-pay

## 2021-11-20 ENCOUNTER — Encounter (HOSPITAL_COMMUNITY): Payer: 59

## 2021-11-21 ENCOUNTER — Encounter (HOSPITAL_COMMUNITY): Payer: 59

## 2021-12-11 ENCOUNTER — Other Ambulatory Visit: Payer: Self-pay | Admitting: Cardiovascular Disease

## 2021-12-11 NOTE — Telephone Encounter (Signed)
Rx refill sent to pharmacy. 

## 2022-01-23 NOTE — Progress Notes (Signed)
Histology and Location of Primary Skin Cancer: Large Basal Cell Carcinoma, Left Supratip      [name] presented with the following signs/symptoms: Lesions on nose  Past/Anticipated interventions by patient's surgeon/dermatologist for current problematic lesion, if any: Skin biopsy on 01-15-22        History of Blistering sunburns, if any:    SAFETY ISSUES: Prior radiation? none Pacemaker/ICD? none Possible current pregnancy? none Is the patient on methotrexate? none  Current Complaints / other details:   Is radiation an option? She needs ablation of thyroid soon.     Vitals:   01/26/22 0738  BP: 124/79  Pulse: 64  Resp: 18  Temp: (!) 97.2 F (36.2 C)  SpO2: 100%

## 2022-01-25 NOTE — Progress Notes (Signed)
Radiation Oncology         [phone] ________________________________  Initial Outpatient Consultation  Name: [name] MRN: [id number]  Date: 01/26/2022  DOB: [birth year]  CC:[name], MD  [name], MD   REFERRING PHYSICIAN: [name], MD  DIAGNOSIS:    ICD-10-CM   1. Basal cell carcinoma of nose  C44.311     2. Basal cell carcinoma (BCC) of skin of nose  C44.311      Large BCC of the left nasal tip    Cancer Staging  Basal cell carcinoma (BCC) of skin of nose Staging form: Cutaneous Carcinoma of the Head and Neck, AJCC 8th Edition - Clinical stage from 01/26/2022: Stage I (cT1, cN0, cM0) - Signed by Eppie Gibson, MD on 01/26/2022 Stage prefix: Initial diagnosis Extraosseous extension: Absent   CHIEF COMPLAINT: Here to discuss management of skin cancer  HISTORY OF PRESENT ILLNESS::[name] is a 67 y.o. female with a history significant for sun exposure who presents today for consideration of radiation therapy in management of BCC located on the left nose.   The patient underwent biopsy of a left nasal tip lesion 01/12/22. Pathology revealed basal cell carcinoma; infiltrative pattern. A lesion located on her right-mid arm was also biopsied at this time and revealed squamous cell carcinoma in situ.   Accordingly, the patient was referred to Dr. Sarajane Jews on 01/22/22 to discuss treatment options. If the patient decides to pursue Mohs, Dr. Sarajane Jews has informed her that she will require reconstructive surgery given the large size of the lesion and involvement of the alar rim. Per Dr. Everett Graff notes, the lesion measures approximately 17 x 11 mm.  Below is a photo from Dr. Sarajane Jews:     She plays pickle ball, tennis, and golfs.  She has a grandmother and frequently flies to [place] to visit with her grandchildren.  She is accompanied by her supportive husband today.  Due to insurance issues she is interested in starting her treatment in May which may  lead to more affordability, but would like to speak with our financial counselors first.  PREVIOUS RADIATION THERAPY: No  PAST MEDICAL HISTORY:  has a past medical history of Abdominal distention, Benign essential hypertension (08/28/2020), Burping, Diarrhea, GERD (gastroesophageal reflux disease), Hyperlipidemia, and Tubulovillous adenoma polyp of colon (02/2009).    PAST SURGICAL HISTORY: Past Surgical History:  Procedure Laterality Date   BLADDER SURGERY     CHOLECYSTECTOMY  2012   laparoscopic   EUS  02/11/2011   Procedure: UPPER ENDOSCOPIC ULTRASOUND (EUS) LINEAR;  Surgeon: Landry Dyke, MD;  Location: [facility];  Service: Endoscopy;  Laterality: N/A;  mac   RIGHT HEART CATH N/A 08/15/2019   Procedure: RIGHT HEART CATH;  Surgeon: Jolaine Artist, MD;  Location: [facility];  Service: Cardiovascular;  Laterality: N/A;   US ECHOCARDIOGRAPHY  11/03/2006   EF 55-60%    FAMILY HISTORY: family history includes Cancer in her paternal grandmother; Heart disease in her mother; Hypertension in her father and mother.  SOCIAL HISTORY:  reports that she has never smoked. She has never used smokeless tobacco. She reports that she does not drink alcohol and does not use drugs.  ALLERGIES: Influenza vaccines  MEDICATIONS:  Current Outpatient Medications  Medication Sig Dispense Refill   Cholecalciferol (VITAMIN D3) 50 MCG (2000 UT) TABS Take 2,000 Units by mouth daily.     ezetimibe (ZETIA) 10 MG tablet TAKE 1 TABLET(10 MG) BY MOUTH DAILY 90 tablet 1   ibuprofen (  ADVIL,MOTRIN) 200 MG tablet Take 200-600 mg by mouth every 6 (six) hours as needed for headache, mild pain or moderate pain.      methimazole (TAPAZOLE) 10 MG tablet Take 1 tablet by mouth daily. After breakfast and after dinner     rosuvastatin (CRESTOR) 20 MG tablet TAKE 1 TABLET(20 MG) BY MOUTH DAILY 90 tablet 3   No current facility-administered medications for this encounter.    REVIEW OF SYSTEMS:  Notable for  that above.   PHYSICAL EXAM:  height is _0  (1.676 m) and weight is 144 lb 3.2 oz (65.4 kg). Her oral temperature is 97.2 F (36.2 C) (abnormal). Her blood pressure is 124/79 and her pulse is 64. Her respiration is 18 and oxygen saturation is 100%.   General: Alert and oriented, in no acute distress  HEENT: Head is normocephalic. Extraocular movements are intact.   Neck: Neck is supple, no palpable periauricular, cervical or supraclavicular lymphadenopathy. Extremities: No cyanosis or edema. Lymphatics: see Neck Exam Skin: There is a scab over the left hip of her nose where she underwent recent biopsy.  There is a slightly raised,slightly papular lesion over the left tip of her nose that is over a centimeter in dimension but under 2 cm Musculoskeletal: symmetric strength and muscle tone throughout. Neurologic: Cranial nerves II through XII are grossly intact. No obvious focalities. Speech is fluent. Coordination is intact. Psychiatric: Judgment and insight are intact. Affect is appropriate.   ECOG = 0  0 - Asymptomatic (Fully active, able to carry on all predisease activities without restriction)  1 - Symptomatic but completely ambulatory (Restricted in physically strenuous activity but ambulatory and able to carry out work of a light or sedentary nature. For example, light housework, office work)  2 - Symptomatic, <50% in bed during the day (Ambulatory and capable of all self care but unable to carry out any work activities. Up and about more than 50% of waking hours)  3 - Symptomatic, >50% in bed, but not bedbound (Capable of only limited self-care, confined to bed or chair 50% or more of waking hours)  4 - Bedbound (Completely disabled. Cannot carry on any self-care. Totally confined to bed or chair)  5 - Death   Eustace Pen MM, Creech RH, Tormey DC, et al. [phone]). "Toxicity and response criteria of the [facility]". [place] Oncol. 5 (6): 649-55   LABORATORY  DATA:  Lab Results  Component Value Date   WBC 4.0 09/19/2019   HGB 13.5 09/19/2019   HCT 40.1 09/19/2019   MCV 87.0 09/19/2019   PLT 339.0 09/19/2019   CMP     Component Value Date/Time   NA 140 10/01/2021 0750   K 4.6 10/01/2021 0750   CL 103 10/01/2021 0750   CO2 24 10/01/2021 0750   GLUCOSE 85 10/01/2021 0750   GLUCOSE 101 (H) 08/14/2019 0540   BUN 15 10/01/2021 0750   CREATININE 0.94 10/01/2021 0750   CREATININE 0.90 04/04/2015 0905   CALCIUM 9.3 10/01/2021 0750   PROT 6.3 07/23/2020 1127   ALBUMIN 4.4 07/23/2020 1127   AST 15 07/23/2020 1127   ALT 24 10/01/2021 0750   ALKPHOS 117 07/23/2020 1127   BILITOT 0.4 07/23/2020 1127   GFRNONAA >60 08/14/2019 0540   GFRAA >60 08/14/2019 0540        RADIOGRAPHY: No results found.    IMPRESSION/PLAN: This is a delightful patient with stage I basal cell carcinoma, left nose   She is not interested  in surgery due to the need for surgical reconstruction after Mohs.  She is interested in alternatives.    Today, I talked to the patient about the findings and work-up thus far.  We discussed the patient's diagnosis of basal cell carcinoma of the nose and general treatment for this, highlighting the role of radiotherapy in the management.  We discussed the available radiation techniques, and focused on the details of logistics and delivery.   We talked about her prognosis and she understands that Mohs surgery may carry a slight benefit in terms of chance of cure but that nevertheless at least 9 out of 10 patients are cured with radiation therapy for her type of disease.  We discussed the risks, benefits, and side effects of electron radiotherapy using custom bolus and standard hypofractionation over 4 weeks. Side effects may include but not necessarily be limited to: Skin irritation, fatigue, peeling of the skin, moist desquamation, scabbing of the skin, irritation inside the nostrils, loss of hair inside the nostrils and over the nose.   Rare nonhealing wound to the nose and or cartilage necrosis is possible.  Some permanent skin changes are not uncommon. No guarantees of treatment were given. A consent form was signed and placed in the patient's medical record. The patient was encouraged to ask questions that I answered to the best of my ability.    She spoke with our financial counselors today and after this discussion she wishes to start treatment planning and treatment in May.  We will schedule her accordingly.   On date of service, in total, I spent 60 minutes on this encounter. Patient was seen in person.   __________________________________________   Eppie Gibson, MD  This document serves as a record of services personally performed by Eppie Gibson, MD. It was created on her behalf by Roney Mans, a trained medical scribe. The creation of this record is based on the scribe's personal observations and the provider's statements to them. This document has been checked and approved by the attending provider.

## 2022-01-26 ENCOUNTER — Ambulatory Visit
Admission: RE | Admit: 2022-01-26 | Discharge: 2022-01-26 | Disposition: A | Discharge status: Home / Self Care | Payer: 59 | Source: Ambulatory Visit | Attending: Radiation Oncology | Admitting: Radiation Oncology

## 2022-01-26 ENCOUNTER — Telehealth: Payer: Self-pay

## 2022-01-26 ENCOUNTER — Encounter: Payer: Self-pay | Admitting: Radiation Oncology

## 2022-01-26 ENCOUNTER — Other Ambulatory Visit: Payer: Self-pay

## 2022-01-26 VITALS — BP 124/79 | HR 64 | Temp 97.2°F | Resp 18 | Ht 66.0 in | Wt 144.2 lb

## 2022-01-26 DIAGNOSIS — Z86010 Personal history of colonic polyps: Secondary | ICD-10-CM | POA: Diagnosis not present

## 2022-01-26 DIAGNOSIS — C44311 Basal cell carcinoma of skin of nose: Secondary | ICD-10-CM | POA: Insufficient documentation

## 2022-01-26 DIAGNOSIS — Z79899 Other long term (current) drug therapy: Secondary | ICD-10-CM | POA: Insufficient documentation

## 2022-01-26 DIAGNOSIS — I10 Essential (primary) hypertension: Secondary | ICD-10-CM | POA: Diagnosis not present

## 2022-01-26 DIAGNOSIS — K219 Gastro-esophageal reflux disease without esophagitis: Secondary | ICD-10-CM | POA: Insufficient documentation

## 2022-01-26 DIAGNOSIS — E785 Hyperlipidemia, unspecified: Secondary | ICD-10-CM | POA: Diagnosis not present

## 2022-01-26 NOTE — Telephone Encounter (Signed)
Rn called pt to see about timeline for starting treatment in May. She stated May 1st, 2024 her medicare would go into effect and cover all the treatment cost. She did mention she would be willing to come in for planning in April to start treatments in May. Rn explained to pt we would be in touch about future appointments for scheduling her CT simulation and treatments. She was relieved that she could wait on this and that insurance would cover this cost.

## 2022-05-06 ENCOUNTER — Ambulatory Visit
Admission: RE | Admit: 2022-05-06 | Discharge: 2022-05-06 | Disposition: A | Discharge status: Home / Self Care | Payer: Medicare HMO | Source: Ambulatory Visit | Attending: Radiation Oncology | Admitting: Radiation Oncology

## 2022-05-06 DIAGNOSIS — C44311 Basal cell carcinoma of skin of nose: Secondary | ICD-10-CM | POA: Diagnosis present

## 2022-05-06 DIAGNOSIS — Z510 Encounter for antineoplastic radiation therapy: Secondary | ICD-10-CM | POA: Insufficient documentation

## 2022-05-08 DIAGNOSIS — Z510 Encounter for antineoplastic radiation therapy: Secondary | ICD-10-CM | POA: Diagnosis not present

## 2022-05-13 ENCOUNTER — Ambulatory Visit: Payer: Medicare HMO | Admitting: Radiation Oncology

## 2022-05-14 ENCOUNTER — Ambulatory Visit: Payer: Medicare HMO

## 2022-05-15 ENCOUNTER — Ambulatory Visit: Payer: Medicare HMO

## 2022-05-18 ENCOUNTER — Ambulatory Visit: Payer: Medicare HMO

## 2022-05-18 ENCOUNTER — Ambulatory Visit
Admission: RE | Admit: 2022-05-18 | Discharge: 2022-05-18 | Disposition: A | Discharge status: Home / Self Care | Payer: Medicare HMO | Source: Ambulatory Visit | Attending: Radiation Oncology | Admitting: Radiation Oncology

## 2022-05-18 ENCOUNTER — Other Ambulatory Visit: Payer: Self-pay

## 2022-05-18 DIAGNOSIS — C44311 Basal cell carcinoma of skin of nose: Secondary | ICD-10-CM

## 2022-05-18 DIAGNOSIS — Z510 Encounter for antineoplastic radiation therapy: Secondary | ICD-10-CM | POA: Diagnosis not present

## 2022-05-18 LAB — RAD ONC ARIA SESSION SUMMARY
Course Elapsed Days: 0
Plan Fractions Treated to Date: 1
Plan Prescribed Dose Per Fraction: 2.5 Gy
Plan Total Fractions Prescribed: 20
Plan Total Prescribed Dose: 50 Gy
Reference Point Dosage Given to Date: 2.5 Gy
Reference Point Session Dosage Given: 2.5 Gy
Session Number: 1

## 2022-05-18 MED ORDER — SONAFINE EX EMUL
1.0000 | Freq: Once | CUTANEOUS | Status: AC
  Administered 2022-05-18: 1 via TOPICAL

## 2022-05-19 ENCOUNTER — Ambulatory Visit
Admission: RE | Admit: 2022-05-19 | Discharge: 2022-05-19 | Disposition: A | Discharge status: Home / Self Care | Payer: Medicare HMO | Source: Ambulatory Visit | Attending: Radiation Oncology | Admitting: Radiation Oncology

## 2022-05-19 ENCOUNTER — Other Ambulatory Visit: Payer: Self-pay

## 2022-05-19 DIAGNOSIS — Z510 Encounter for antineoplastic radiation therapy: Secondary | ICD-10-CM | POA: Diagnosis not present

## 2022-05-19 LAB — RAD ONC ARIA SESSION SUMMARY
Course Elapsed Days: 1
Plan Fractions Treated to Date: 2
Plan Prescribed Dose Per Fraction: 2.5 Gy
Plan Total Fractions Prescribed: 20
Plan Total Prescribed Dose: 50 Gy
Reference Point Dosage Given to Date: 5 Gy
Reference Point Session Dosage Given: 2.5 Gy
Session Number: 2

## 2022-05-20 ENCOUNTER — Ambulatory Visit
Admission: RE | Admit: 2022-05-20 | Discharge: 2022-05-20 | Disposition: A | Discharge status: Home / Self Care | Payer: Medicare HMO | Source: Ambulatory Visit | Attending: Radiation Oncology

## 2022-05-20 ENCOUNTER — Other Ambulatory Visit: Payer: Self-pay

## 2022-05-20 DIAGNOSIS — Z510 Encounter for antineoplastic radiation therapy: Secondary | ICD-10-CM | POA: Diagnosis not present

## 2022-05-20 LAB — RAD ONC ARIA SESSION SUMMARY
Course Elapsed Days: 2
Plan Fractions Treated to Date: 3
Plan Prescribed Dose Per Fraction: 2.5 Gy
Plan Total Fractions Prescribed: 20
Plan Total Prescribed Dose: 50 Gy
Reference Point Dosage Given to Date: 7.5 Gy
Reference Point Session Dosage Given: 2.5 Gy
Session Number: 3

## 2022-05-21 ENCOUNTER — Ambulatory Visit
Admission: RE | Admit: 2022-05-21 | Discharge: 2022-05-21 | Disposition: A | Discharge status: Home / Self Care | Payer: Medicare HMO | Source: Ambulatory Visit | Attending: Radiation Oncology | Admitting: Radiation Oncology

## 2022-05-21 ENCOUNTER — Other Ambulatory Visit: Payer: Self-pay

## 2022-05-21 DIAGNOSIS — Z510 Encounter for antineoplastic radiation therapy: Secondary | ICD-10-CM | POA: Diagnosis not present

## 2022-05-21 LAB — RAD ONC ARIA SESSION SUMMARY
Course Elapsed Days: 3
Plan Fractions Treated to Date: 4
Plan Prescribed Dose Per Fraction: 2.5 Gy
Plan Total Fractions Prescribed: 20
Plan Total Prescribed Dose: 50 Gy
Reference Point Dosage Given to Date: 10 Gy
Reference Point Session Dosage Given: 2.5 Gy
Session Number: 4

## 2022-05-22 ENCOUNTER — Other Ambulatory Visit: Payer: Self-pay

## 2022-05-22 ENCOUNTER — Ambulatory Visit
Admission: RE | Admit: 2022-05-22 | Discharge: 2022-05-22 | Disposition: A | Discharge status: Home / Self Care | Payer: Medicare HMO | Source: Ambulatory Visit | Attending: Radiation Oncology

## 2022-05-22 DIAGNOSIS — Z510 Encounter for antineoplastic radiation therapy: Secondary | ICD-10-CM | POA: Diagnosis not present

## 2022-05-22 LAB — RAD ONC ARIA SESSION SUMMARY
Course Elapsed Days: 4
Plan Fractions Treated to Date: 5
Plan Prescribed Dose Per Fraction: 2.5 Gy
Plan Total Fractions Prescribed: 20
Plan Total Prescribed Dose: 50 Gy
Reference Point Dosage Given to Date: 12.5 Gy
Reference Point Session Dosage Given: 2.5 Gy
Session Number: 5

## 2022-05-25 ENCOUNTER — Other Ambulatory Visit: Payer: Self-pay

## 2022-05-25 ENCOUNTER — Ambulatory Visit
Admission: RE | Admit: 2022-05-25 | Discharge: 2022-05-25 | Disposition: A | Discharge status: Home / Self Care | Payer: Medicare HMO | Source: Ambulatory Visit | Attending: Radiation Oncology | Admitting: Radiation Oncology

## 2022-05-25 ENCOUNTER — Ambulatory Visit
Admission: RE | Admit: 2022-05-25 | Discharge: 2022-05-25 | Disposition: A | Discharge status: Home / Self Care | Payer: Medicare HMO | Source: Ambulatory Visit | Attending: Radiation Oncology

## 2022-05-25 DIAGNOSIS — Z510 Encounter for antineoplastic radiation therapy: Secondary | ICD-10-CM | POA: Diagnosis not present

## 2022-05-25 LAB — RAD ONC ARIA SESSION SUMMARY
Course Elapsed Days: 7
Plan Fractions Treated to Date: 6
Plan Prescribed Dose Per Fraction: 2.5 Gy
Plan Total Fractions Prescribed: 20
Plan Total Prescribed Dose: 50 Gy
Reference Point Dosage Given to Date: 15 Gy
Reference Point Session Dosage Given: 2.5 Gy
Session Number: 6

## 2022-05-26 ENCOUNTER — Ambulatory Visit
Admission: RE | Admit: 2022-05-26 | Discharge: 2022-05-26 | Disposition: A | Discharge status: Home / Self Care | Payer: Medicare HMO | Source: Ambulatory Visit | Attending: Radiation Oncology

## 2022-05-26 ENCOUNTER — Other Ambulatory Visit: Payer: Self-pay

## 2022-05-26 DIAGNOSIS — Z510 Encounter for antineoplastic radiation therapy: Secondary | ICD-10-CM | POA: Diagnosis not present

## 2022-05-26 LAB — RAD ONC ARIA SESSION SUMMARY
Course Elapsed Days: 8
Plan Fractions Treated to Date: 7
Plan Prescribed Dose Per Fraction: 2.5 Gy
Plan Total Fractions Prescribed: 20
Plan Total Prescribed Dose: 50 Gy
Reference Point Dosage Given to Date: 17.5 Gy
Reference Point Session Dosage Given: 2.5 Gy
Session Number: 7

## 2022-05-27 ENCOUNTER — Ambulatory Visit
Admission: RE | Admit: 2022-05-27 | Discharge: 2022-05-27 | Disposition: A | Discharge status: Home / Self Care | Payer: Medicare HMO | Source: Ambulatory Visit | Attending: Radiation Oncology | Admitting: Radiation Oncology

## 2022-05-27 ENCOUNTER — Other Ambulatory Visit: Payer: Self-pay

## 2022-05-27 DIAGNOSIS — Z510 Encounter for antineoplastic radiation therapy: Secondary | ICD-10-CM | POA: Diagnosis not present

## 2022-05-27 LAB — RAD ONC ARIA SESSION SUMMARY
Course Elapsed Days: 9
Plan Fractions Treated to Date: 8
Plan Prescribed Dose Per Fraction: 2.5 Gy
Plan Total Fractions Prescribed: 20
Plan Total Prescribed Dose: 50 Gy
Reference Point Dosage Given to Date: 20 Gy
Reference Point Session Dosage Given: 2.5 Gy
Session Number: 8

## 2022-05-28 ENCOUNTER — Ambulatory Visit
Admission: RE | Admit: 2022-05-28 | Discharge: 2022-05-28 | Disposition: A | Discharge status: Home / Self Care | Payer: Medicare HMO | Source: Ambulatory Visit | Attending: Radiation Oncology | Admitting: Radiation Oncology

## 2022-05-28 ENCOUNTER — Other Ambulatory Visit: Payer: Self-pay

## 2022-05-28 DIAGNOSIS — Z510 Encounter for antineoplastic radiation therapy: Secondary | ICD-10-CM | POA: Diagnosis not present

## 2022-05-28 LAB — RAD ONC ARIA SESSION SUMMARY
Course Elapsed Days: 10
Plan Fractions Treated to Date: 9
Plan Prescribed Dose Per Fraction: 2.5 Gy
Plan Total Fractions Prescribed: 20
Plan Total Prescribed Dose: 50 Gy
Reference Point Dosage Given to Date: 22.5 Gy
Reference Point Session Dosage Given: 2.5 Gy
Session Number: 9

## 2022-05-29 ENCOUNTER — Ambulatory Visit
Admission: RE | Admit: 2022-05-29 | Discharge: 2022-05-29 | Disposition: A | Discharge status: Home / Self Care | Payer: Medicare HMO | Source: Ambulatory Visit | Attending: Radiation Oncology | Admitting: Radiation Oncology

## 2022-05-29 ENCOUNTER — Other Ambulatory Visit: Payer: Self-pay

## 2022-05-29 DIAGNOSIS — Z510 Encounter for antineoplastic radiation therapy: Secondary | ICD-10-CM | POA: Diagnosis not present

## 2022-05-29 LAB — RAD ONC ARIA SESSION SUMMARY
Course Elapsed Days: 11
Plan Fractions Treated to Date: 10
Plan Prescribed Dose Per Fraction: 2.5 Gy
Plan Total Fractions Prescribed: 20
Plan Total Prescribed Dose: 50 Gy
Reference Point Dosage Given to Date: 25 Gy
Reference Point Session Dosage Given: 2.5 Gy
Session Number: 10

## 2022-06-02 ENCOUNTER — Other Ambulatory Visit: Payer: Self-pay

## 2022-06-02 ENCOUNTER — Ambulatory Visit
Admission: RE | Admit: 2022-06-02 | Discharge: 2022-06-02 | Disposition: A | Discharge status: Home / Self Care | Payer: Medicare HMO | Source: Ambulatory Visit | Attending: Radiation Oncology | Admitting: Radiation Oncology

## 2022-06-02 ENCOUNTER — Ambulatory Visit
Admission: RE | Admit: 2022-06-02 | Discharge: 2022-06-02 | Disposition: A | Discharge status: Home / Self Care | Payer: Medicare HMO | Source: Ambulatory Visit | Attending: Radiation Oncology

## 2022-06-02 DIAGNOSIS — Z510 Encounter for antineoplastic radiation therapy: Secondary | ICD-10-CM | POA: Diagnosis not present

## 2022-06-02 LAB — RAD ONC ARIA SESSION SUMMARY
Course Elapsed Days: 15
Plan Fractions Treated to Date: 11
Plan Prescribed Dose Per Fraction: 2.5 Gy
Plan Total Fractions Prescribed: 20
Plan Total Prescribed Dose: 50 Gy
Reference Point Dosage Given to Date: 27.5 Gy
Reference Point Session Dosage Given: 2.5 Gy
Session Number: 11

## 2022-06-03 ENCOUNTER — Ambulatory Visit
Admission: RE | Admit: 2022-06-03 | Discharge: 2022-06-03 | Disposition: A | Discharge status: Home / Self Care | Payer: Medicare HMO | Source: Ambulatory Visit | Attending: Radiation Oncology | Admitting: Radiation Oncology

## 2022-06-03 ENCOUNTER — Other Ambulatory Visit: Payer: Self-pay

## 2022-06-03 DIAGNOSIS — Z510 Encounter for antineoplastic radiation therapy: Secondary | ICD-10-CM | POA: Diagnosis not present

## 2022-06-03 LAB — RAD ONC ARIA SESSION SUMMARY
Course Elapsed Days: 16
Plan Fractions Treated to Date: 12
Plan Prescribed Dose Per Fraction: 2.5 Gy
Plan Total Fractions Prescribed: 20
Plan Total Prescribed Dose: 50 Gy
Reference Point Dosage Given to Date: 30 Gy
Reference Point Session Dosage Given: 2.5 Gy
Session Number: 12

## 2022-06-04 ENCOUNTER — Other Ambulatory Visit: Payer: Self-pay

## 2022-06-04 ENCOUNTER — Ambulatory Visit
Admission: RE | Admit: 2022-06-04 | Discharge: 2022-06-04 | Disposition: A | Discharge status: Home / Self Care | Payer: Medicare HMO | Source: Ambulatory Visit | Attending: Radiation Oncology | Admitting: Radiation Oncology

## 2022-06-04 DIAGNOSIS — Z510 Encounter for antineoplastic radiation therapy: Secondary | ICD-10-CM | POA: Diagnosis not present

## 2022-06-04 LAB — RAD ONC ARIA SESSION SUMMARY
Course Elapsed Days: 17
Plan Fractions Treated to Date: 13
Plan Prescribed Dose Per Fraction: 2.5 Gy
Plan Total Fractions Prescribed: 20
Plan Total Prescribed Dose: 50 Gy
Reference Point Dosage Given to Date: 32.5 Gy
Reference Point Session Dosage Given: 2.5 Gy
Session Number: 13

## 2022-06-05 ENCOUNTER — Other Ambulatory Visit: Payer: Self-pay

## 2022-06-05 ENCOUNTER — Ambulatory Visit
Admission: RE | Admit: 2022-06-05 | Discharge: 2022-06-05 | Disposition: A | Discharge status: Home / Self Care | Payer: Medicare HMO | Source: Ambulatory Visit | Attending: Radiation Oncology | Admitting: Radiation Oncology

## 2022-06-05 DIAGNOSIS — Z510 Encounter for antineoplastic radiation therapy: Secondary | ICD-10-CM | POA: Diagnosis not present

## 2022-06-05 LAB — RAD ONC ARIA SESSION SUMMARY
Course Elapsed Days: 18
Plan Fractions Treated to Date: 14
Plan Prescribed Dose Per Fraction: 2.5 Gy
Plan Total Fractions Prescribed: 20
Plan Total Prescribed Dose: 50 Gy
Reference Point Dosage Given to Date: 35 Gy
Reference Point Session Dosage Given: 2.5 Gy
Session Number: 14

## 2022-06-08 ENCOUNTER — Ambulatory Visit: Payer: Medicare HMO

## 2022-06-08 ENCOUNTER — Ambulatory Visit
Admission: RE | Admit: 2022-06-08 | Discharge: 2022-06-08 | Disposition: A | Discharge status: Home / Self Care | Payer: Medicare HMO | Source: Ambulatory Visit | Attending: Radiation Oncology | Admitting: Radiation Oncology

## 2022-06-08 ENCOUNTER — Other Ambulatory Visit: Payer: Self-pay

## 2022-06-08 DIAGNOSIS — Z510 Encounter for antineoplastic radiation therapy: Secondary | ICD-10-CM | POA: Diagnosis present

## 2022-06-08 DIAGNOSIS — C44311 Basal cell carcinoma of skin of nose: Secondary | ICD-10-CM | POA: Insufficient documentation

## 2022-06-08 LAB — RAD ONC ARIA SESSION SUMMARY
Course Elapsed Days: 21
Plan Fractions Treated to Date: 15
Plan Prescribed Dose Per Fraction: 2.5 Gy
Plan Total Fractions Prescribed: 20
Plan Total Prescribed Dose: 50 Gy
Reference Point Dosage Given to Date: 37.5 Gy
Reference Point Session Dosage Given: 2.5 Gy
Session Number: 15

## 2022-06-09 ENCOUNTER — Ambulatory Visit
Admission: RE | Admit: 2022-06-09 | Discharge: 2022-06-09 | Disposition: A | Discharge status: Home / Self Care | Payer: Medicare HMO | Source: Ambulatory Visit | Attending: Radiation Oncology | Admitting: Radiation Oncology

## 2022-06-09 ENCOUNTER — Other Ambulatory Visit: Payer: Self-pay

## 2022-06-09 DIAGNOSIS — Z510 Encounter for antineoplastic radiation therapy: Secondary | ICD-10-CM | POA: Diagnosis not present

## 2022-06-09 LAB — RAD ONC ARIA SESSION SUMMARY
Course Elapsed Days: 22
Plan Fractions Treated to Date: 16
Plan Prescribed Dose Per Fraction: 2.5 Gy
Plan Total Fractions Prescribed: 20
Plan Total Prescribed Dose: 50 Gy
Reference Point Dosage Given to Date: 40 Gy
Reference Point Session Dosage Given: 2.5 Gy
Session Number: 16

## 2022-06-10 ENCOUNTER — Ambulatory Visit
Admission: RE | Admit: 2022-06-10 | Discharge: 2022-06-10 | Disposition: A | Discharge status: Home / Self Care | Payer: Medicare HMO | Source: Ambulatory Visit | Attending: Radiation Oncology | Admitting: Radiation Oncology

## 2022-06-10 ENCOUNTER — Ambulatory Visit: Payer: Medicare HMO

## 2022-06-10 ENCOUNTER — Other Ambulatory Visit: Payer: Self-pay

## 2022-06-10 DIAGNOSIS — Z510 Encounter for antineoplastic radiation therapy: Secondary | ICD-10-CM | POA: Diagnosis not present

## 2022-06-10 LAB — RAD ONC ARIA SESSION SUMMARY
Course Elapsed Days: 23
Plan Fractions Treated to Date: 17
Plan Prescribed Dose Per Fraction: 2.5 Gy
Plan Total Fractions Prescribed: 20
Plan Total Prescribed Dose: 50 Gy
Reference Point Dosage Given to Date: 42.5 Gy
Reference Point Session Dosage Given: 2.5 Gy
Session Number: 17

## 2022-06-11 ENCOUNTER — Other Ambulatory Visit: Payer: Self-pay

## 2022-06-11 ENCOUNTER — Ambulatory Visit
Admission: RE | Admit: 2022-06-11 | Discharge: 2022-06-11 | Disposition: A | Discharge status: Home / Self Care | Payer: Medicare HMO | Source: Ambulatory Visit | Attending: Radiation Oncology | Admitting: Radiation Oncology

## 2022-06-11 DIAGNOSIS — Z510 Encounter for antineoplastic radiation therapy: Secondary | ICD-10-CM | POA: Diagnosis not present

## 2022-06-11 LAB — RAD ONC ARIA SESSION SUMMARY
Course Elapsed Days: 24
Plan Fractions Treated to Date: 18
Plan Prescribed Dose Per Fraction: 2.5 Gy
Plan Total Fractions Prescribed: 20
Plan Total Prescribed Dose: 50 Gy
Reference Point Dosage Given to Date: 45 Gy
Reference Point Session Dosage Given: 2.5 Gy
Session Number: 18

## 2022-06-12 ENCOUNTER — Other Ambulatory Visit: Payer: Self-pay

## 2022-06-12 ENCOUNTER — Ambulatory Visit
Admission: RE | Admit: 2022-06-12 | Discharge: 2022-06-12 | Disposition: A | Discharge status: Home / Self Care | Payer: Medicare HMO | Source: Ambulatory Visit | Attending: Radiation Oncology | Admitting: Radiation Oncology

## 2022-06-12 DIAGNOSIS — Z510 Encounter for antineoplastic radiation therapy: Secondary | ICD-10-CM | POA: Diagnosis not present

## 2022-06-12 LAB — RAD ONC ARIA SESSION SUMMARY
Course Elapsed Days: 25
Plan Fractions Treated to Date: 19
Plan Prescribed Dose Per Fraction: 2.5 Gy
Plan Total Fractions Prescribed: 20
Plan Total Prescribed Dose: 50 Gy
Reference Point Dosage Given to Date: 47.5 Gy
Reference Point Session Dosage Given: 2.5 Gy
Session Number: 19

## 2022-06-15 ENCOUNTER — Other Ambulatory Visit: Payer: Self-pay

## 2022-06-15 ENCOUNTER — Ambulatory Visit
Admission: RE | Admit: 2022-06-15 | Discharge: 2022-06-15 | Disposition: A | Discharge status: Home / Self Care | Payer: Medicare HMO | Source: Ambulatory Visit | Attending: Radiation Oncology | Admitting: Radiation Oncology

## 2022-06-15 DIAGNOSIS — Z510 Encounter for antineoplastic radiation therapy: Secondary | ICD-10-CM | POA: Diagnosis not present

## 2022-06-15 LAB — RAD ONC ARIA SESSION SUMMARY
Course Elapsed Days: 28
Plan Fractions Treated to Date: 20
Plan Prescribed Dose Per Fraction: 2.5 Gy
Plan Total Fractions Prescribed: 20
Plan Total Prescribed Dose: 50 Gy
Reference Point Dosage Given to Date: 50 Gy
Reference Point Session Dosage Given: 2.5 Gy
Session Number: 20

## 2022-06-18 ENCOUNTER — Telehealth: Payer: Self-pay

## 2022-06-18 NOTE — Telephone Encounter (Signed)
Rn called pt to let her know that Silvadene cream had been called into the [facility] at [address], [place], with no complications. RN instructed pt to place a small amount on her forearm for six hours and monitor for reaction. If no reaction then the pt was instructed to use the cream twice a day for two weeks. Rn requested pt send a follow up picture in two weeks to ensure wound healing. Pt was understanding of this plan and grateful for the phone call.

## 2022-06-18 NOTE — Telephone Encounter (Signed)
   Pt called Rn with concerns for wound healing and nasal drainage. Pt reported the scab has fallen off several times in the shower and then rescabs over.  She also reports bleeding at radiation site with scabs. She is currently using sonefine and neosporin to her nose. Rn encouraged pt that is healing process and nasal drainage are normal. Rn will have Ellle PA-C to look at image from feedback to see if silvadene may be effective to help with wound healing. The pt is traveling in [place] at this time and would like any prescription to be [facility] to [facility] address  [address], [facility] [place]. Rn will follow up with pt once feedback has been given by provider.

## 2022-06-18 NOTE — Radiation Completion Notes (Signed)
Patient Name: [name], [name] MRN: [id number] Date of Birth: [birth year] Referring Physician: Mathews Robinsons, M.D. Date of Service: 2022-06-18 Radiation Oncologist: Lonie Peak, M.D. [facility]                             RADIATION ONCOLOGY END OF TREATMENT NOTE     Diagnosis: C44.311 Basal cell carcinoma of skin of nose Staging on 2022-01-26: Basal cell carcinoma (BCC) of skin of nose T=cT1, N=cN0, M=cM0 Intent: Curative     ==========DELIVERED PLANS==========  First Treatment Date: 2022-05-18 - Last Treatment Date: 2022-06-15   Plan Name: HN_Nose Site: Nose Technique: Electron Mode: Electron Dose Per Fraction: 2.5 Gy Prescribed Dose (Delivered / Prescribed): 50 Gy / 50 Gy Prescribed Fxs (Delivered / Prescribed): 20 / 20     ==========ON TREATMENT VISIT DATES========== 2022-05-18, 2022-05-25, 2022-06-02, 2022-06-12     ==========UPCOMING VISITS==========       ==========APPENDIX - ON TREATMENT VISIT NOTES==========   See weekly On Treatment Notes in Epic for details.

## 2022-06-22 ENCOUNTER — Telehealth: Payer: Self-pay

## 2022-06-22 NOTE — Telephone Encounter (Signed)
   Updated pic of pt nose. Rn encouraged pt this wound is healing well with the silvadene and encouraged continue use. Rn requested a follow up pic mid week to check on progress of healing. Rn will also send pic to Dr. Basilio Cairo for review.

## 2022-06-25 ENCOUNTER — Telehealth: Payer: Self-pay

## 2022-06-25 NOTE — Telephone Encounter (Signed)
   Pt provided updated photo of her nose. It appears she is healing well. Rn encouraged her to continue to use the silvadene cream until Monday and send an updated photo to RN. Rn will also send to Dr. Basilio Cairo for review. PT was grateful for the phone call and assistance.

## 2022-06-29 ENCOUNTER — Telehealth: Payer: Self-pay

## 2022-06-29 NOTE — Telephone Encounter (Signed)
   Updated pt photo:  Wound appears to be healing well. There is an area on the left that the dermatologist is looking at for pt. She has an appointment in July for this concern. Rn encouraged use of silvadene for Friday and requested updated picture on Friday.

## 2022-07-03 ENCOUNTER — Telehealth: Payer: Self-pay

## 2022-07-03 NOTE — Telephone Encounter (Signed)
   Updated nose photo: Pt is pleased with her progress. RN left voicemail for pt to stop using silvadene and start using sonefine cream and Vitamin E cream to nose (alternating the two). Rn also assured pt that the brown discoloration would improve as well. She will send an updated picture in a week. Overall she is pleased with her would healing.

## 2022-07-13 ENCOUNTER — Telehealth: Payer: Self-pay | Admitting: Cardiovascular Disease

## 2022-07-13 MED ORDER — ROSUVASTATIN CALCIUM 20 MG PO TABS: 20.0000 mg | ORAL_TABLET | Freq: Every day | ORAL | 0 refills | Status: DC

## 2022-07-13 MED ORDER — EZETIMIBE 10 MG PO TABS: ORAL_TABLET | ORAL | 0 refills | Status: DC

## 2022-07-13 NOTE — Telephone Encounter (Signed)
*  STAT* If patient is at the pharmacy, call can be transferred to refill team.   1. Which medications need to be refilled? (please list name of each medication and dose if known)   rosuvastatin (CRESTOR) 20 MG tablet    ezetimibe (ZETIA) 10 MG tablet    2. Which pharmacy/location (including street and city if local pharmacy) is medication to be sent to?  [id number] - [place], [place] - [address] [facility] [address]      3. Do they need a 30 day or 90 day supply? 90 day

## 2022-07-13 NOTE — Telephone Encounter (Signed)
Pt's medications were sent to pt's pharmacy as requested. Confirmation received.  

## 2022-07-21 ENCOUNTER — Ambulatory Visit
Admission: RE | Admit: 2022-07-21 | Discharge: 2022-07-21 | Disposition: A | Discharge status: Home / Self Care | Payer: Medicare HMO | Source: Ambulatory Visit | Attending: Radiation Oncology | Admitting: Radiation Oncology

## 2022-07-21 ENCOUNTER — Encounter: Payer: Self-pay | Admitting: Radiation Oncology

## 2022-07-21 ENCOUNTER — Other Ambulatory Visit: Payer: Self-pay

## 2022-07-21 VITALS — BP 130/78 | HR 71 | Resp 18 | Wt 140.1 lb

## 2022-07-21 DIAGNOSIS — C44311 Basal cell carcinoma of skin of nose: Secondary | ICD-10-CM

## 2022-07-21 NOTE — Progress Notes (Signed)
Radiation Oncology         [phone] ________________________________  Name: [name] MRN: [id number]  Date: 07/21/2022  DOB: 11-07-1955  Follow-Up Visit Note  CC: Alysia Penna, MD  Alysia Penna, MD  Diagnosis and Prior Radiotherapy:       ICD-10-CM   1. Basal cell carcinoma of nose  C44.311       CHIEF COMPLAINT:  Here for follow-up and surveillance of skin cancer to her nose. She completed treatment on 06/15/22.   ==========DELIVERED PLANS==========  First Treatment Date: 2022-05-18 - Last Treatment Date: 2022-06-15   Plan Name: HN_Nose Site: Nose Technique: Electron Mode: Electron Dose Per Fraction: 2.5 Gy Prescribed Dose (Delivered / Prescribed): 50 Gy / 50 Gy Prescribed Fxs (Delivered / Prescribed): 20 / 20  Narrative:  The patient returns today for routine, 1 month follow-up.                     Pain issues, if any: None  Skin:  States that she has a bump and indentation on nose. Using a feeding tube?: na Weight changes, if any: None Swallowing issues, if any: na Smoking or chewing tobacco? None Using fluoride toothpaste daily? no Last ENT/Dermatology visit was on: States that it been a while Other notable issues, if any: N/A  ALLERGIES:  is allergic to influenza vaccines.  Meds: Current Outpatient Medications  Medication Sig Dispense Refill   Cholecalciferol (VITAMIN D3) 50 MCG (2000 UT) TABS Take 2,000 Units by mouth daily.     ezetimibe (ZETIA) 10 MG tablet TAKE 1 TABLET(10 MG) BY MOUTH DAILY 90 tablet 0   ibuprofen (ADVIL,MOTRIN) 200 MG tablet Take 200-600 mg by mouth every 6 (six) hours as needed for headache, mild pain or moderate pain.      methimazole (TAPAZOLE) 10 MG tablet Take 1 tablet by mouth daily. After breakfast and after dinner     rosuvastatin (CRESTOR) 20 MG tablet Take 1 tablet (20 mg total) by mouth daily. 90 tablet 0   No current facility-administered medications for this encounter.    Physical Findings: The patient is  in no acute distress. Patient is alert and oriented. Wt Readings from Last 3 Encounters:  07/21/22 140 lb 2 oz (63.6 kg)  01/26/22 144 lb 3.2 oz (65.4 kg)  08/04/21 148 lb (67.1 kg)    weight is 140 lb 2 oz (63.6 kg). Her blood pressure is 130/78 and her pulse is 71. Her respiration is 18 and oxygen saturation is 100%. .  General: Alert and oriented, in no acute distress HEENT: Head is normocephalic. Extraocular movements are intact.  Neck: Neck is notable for a goiter in the right lobe. Skin: Skin in treatment fields shows satisfactory healing. Small area of raised scar tissue seen at the left tip of her nose. (See below - note that patient had foundation on skin at time of photo; after foundation was removed, that area was faintly erythematous) Heart: Regular in rate and rhythm with no murmurs, rubs, or gallops. Chest: Clear to auscultation bilaterally, with no rhonchi, wheezes, or rales. Abdomen: Soft, nontender, nondistended, with no rigidity or guarding. Extremities: No cyanosis or edema. Lymphatics: see Neck Exam Psychiatric: Judgment and insight are intact. Affect is appropriate.    Media Information   Document Information  Photos    07/21/2022 15:11  Attached To:  Hospital Encounter on 07/21/22 with Lonie Peak, MD  Source Information  Erven Colla, PA-C  [facility]  Document History  Lab Findings: Lab Results  Component Value Date   WBC 4.0 09/19/2019   HGB 13.5 09/19/2019   HCT 40.1 09/19/2019   MCV 87.0 09/19/2019   PLT 339.0 09/19/2019    Lab Results  Component Value Date   TSH 3.470 03/09/2016    Radiographic Findings: No results found.  Impression/Plan:    1) Squamous cell carcinoma of the nose: Patient tolerated the treatment well and shows satisfactory healing in the treatment field.  I suspect that she will always have a little bit of scar tissue at the area where her cancer and biopsy were located.  She will continue to follow  closely with dermatology for monitoring.  2) Thyroid goiter: patient has known Grave's disease and has put off an ablation for her radiation treatment.  We defer to her other physicians on management  3) Patient saw her dermatologist a couple days ago and will see her again in October. She will continue to see her annually. Follow-up in with radiation PRN. The patient was encouraged to call with any issues or questions before then.   On date of service, in total, I spent 20 minutes on this encounter. Patient was seen in person. _____________________________________   Joyice Faster, PA-C    Lonie Peak, MD

## 2022-08-30 ENCOUNTER — Encounter: Payer: Self-pay | Admitting: Cardiovascular Disease

## 2022-08-30 NOTE — Progress Notes (Unsigned)
Cardiology Office Note   Date:  08/31/2022   ID:  [name], DOB 1955-06-16, MRN [id number]  PCP:  Alysia Penna, MD  Cardiologist:   Kristeen Miss, MD   Chief Complaint  Patient presents with   Hyperlipidemia   Problem List: 1. Hypercholesterolemia 2. Family Hx of CAD  He's been quite active. She's back playing tennis and is doing well. She denies any chest pain or shortness breath.  She works out on a regular basis and is able to achieve a peak heart rate 185 160 without too much difficulty. She works out for 30 minutes at a time doing these exercises.  She's also walking in the evenings with her husband.   March 23, 2013:   [name] is doing OK.  She has stopped her Atrovastatin.  - she ran out and could not get back into see me.    Her LDL was 181 this last time.  Her previous LDL was 108.    She admits that her diet has not been as good and she has not been working out as much.   March 23, 2014:  [name] is a 67 y.o. female who presents for follow up of her hyperlipidemia.    She's done very well. She's exercising readily. She's lost a little bit of weight. She's not having any episodes of chest pain.  April 05, 2015:  [name] is seen today for follow up of her hyperlipdiemia. Staying active - golf and tennis regularly .  Walks regularly BP is up slighlty .  March 05, 2016:  Presents with stabbing like chest pain.  Quick jabs. At rest . Doesn't increase with activity. Has not been exercising as much.   Walking does not increase pain .   Not related to eating.  Not related to taking a deep breath.   Has gained some weight ( 10 lbs )  Has not been taking the atorvastatin as regularly  Has been present for 3 weeks .   Occurs almost daily .   May 04, 2017:  Doing well Working out regularly .  Feeling well  No chest pains or shortness of breath. Her last lipid levels were overall okay but her LDL is still 101.  I would like for her LDL to be around 70  since she has a family here history of early coronary disease in her mother. She admits that she does not take her atorvastatin every day and thinks she misses doses  Most  weekends.  Oct. 9, 2020 [name] is seen today for follow of her CP and hyperlipidemia. Has been busy with doctors visit s Playing golf and tennis .  No cp.  Hikes occasionally  Tolerating the crestor well  Sept. 23, 2021:  [name] is seen today for follow up  She has had persistent dyspnea. She was hospitalized a month ago covid test was negative. Right heart cath was normal She had a negative CT angiogram of the chest.  No evidence of pulmonary embolus.  She was thought to have asthma , started on inhalers,  These caused her to have tachycardia - felt jittery,  did not feel any better  PFTs were normal   She thinks her dyspnea  may be all due to allergies.    She stopped all her pulmonary meds several days ago .  Still on rosuvastatin   We have suggested cardiopulmonary stress testing at some point .  Nov. 16, 2021  [name] is seen today for  follow up of her worsening dyspnea She has been found to have Graves disease She had not improved despite being on Methimazol for 4 weeks and Metoprolol / Propranolol for 1-2 months   I have reviewed her echo and Coronary Ct angio from Aug., 2021  She is seen back today to make sure there is no cardiac component to her dyspnea.  Her mother had a hx of premature CAD  Started Methimazole on Oct. 20, 2021:  We started propranolol and then Metoprolol mid Oct.  She is on Metoprolol XL 25 mg a day .   We have encouraged her to take some propranolol along with the metoprolol if she has palpitations   She was given hydroxyzine by pulmonary yesterday  Has not been able to get it yet ( trouble at the pharmacy)    July 23, 2020:  [name] is seen today for follow up of her HLD and Graves disease. Outside records show a very elevated TSH several months ago  Her tapazole  dose is slowly being reduced.  TSH is now WNL  Doing well  Is off Toprol XL On Rosuvastatin 20 mg a day  Her coronary calcium score is 77 which places her in the 84th percentile for age and gender matched controls. LDL in Jan. 2021 was 74.   August 04, 2021 [name] is seen today for follow up of her HLD, coronary calcifications, graves disease.  Coronary CTA from 08/14/19 Coronary calcium score is 77, which places the patient in the 84th percentile for age and sex matched control. RCA - normal  LM - mild plaque LAD mild - mod disease Ramus - normal LCx - small and non dominate  Normal sized aorta   Labs from 08/01/21 were reviewed LDL is 62 HDL is 60 Trigs are 109   Was on zetia 10 mg a day but ran out 2 weeks ago .    Add Apo B or LP (a)  Has been told she needs to have RAI ablation for her Graves disease     Aug. 26, 2024 [name] is seen for follow up of her HLD, mild CAD CAC is 77  has mild- moderate disease in the LAD  Labs from Sept. 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   peicemeal polyp    Past Surgical History:  Procedure Laterality Date   BLADDER SURGERY     CHOLECYSTECTOMY  2012   laparoscopic   EUS  02/11/2011   Procedure: UPPER ENDOSCOPIC ULTRASOUND (EUS) LINEAR;  Surgeon: Freddy Jaksch, MD;  Location: [facility];  Service: Endoscopy;  Laterality: N/A;  mac   RIGHT HEART CATH N/A 08/15/2019   Procedure: RIGHT HEART CATH;  Surgeon: Dolores Patty, MD;  Location: [facility];  Service: Cardiovascular;  Laterality: N/A;   US ECHOCARDIOGRAPHY  11/03/2006    EF 55-60%     Current Outpatient Medications  Medication Sig Dispense Refill   ezetimibe (ZETIA) 10 MG tablet TAKE 1  TABLET(10 MG) BY MOUTH DAILY 90 tablet 0   ibuprofen (ADVIL,MOTRIN) 200 MG tablet Take 200-600 mg by mouth every 6 (six) hours as needed for headache, mild pain or moderate pain.      methimazole (TAPAZOLE) 10 MG tablet Take 1 tablet by mouth daily. After breakfast and after dinner     rosuvastatin (CRESTOR) 20 MG tablet Take 1 tablet (20 mg total) by mouth daily. 90 tablet 0   Cholecalciferol (VITAMIN D3) 50 MCG (2000 UT) TABS Take 2,000 Units by mouth daily. (Patient not taking: Reported on 08/31/2022)     No current facility-administered medications for this visit.    Allergies:   Influenza vaccines    Social History:  The patient  reports that she has never smoked. She has never used smokeless tobacco. She reports that she does not drink alcohol and does not use drugs.   Family History:  The patient's family history includes Cancer in her paternal grandmother; Heart disease in her mother; Hypertension in her father and mother.    ROS: Noted in current history, otherwise negative.    Physical Exam: Blood pressure 110/66, pulse 64, height 5\' 6"  (1.676 m), weight 139 lb 12.8 oz (63.4 kg), SpO2 99%.       GEN:  Well nourished, well developed in no acute distress HEENT: Normal NECK: No JVD; No carotid bruits LYMPHATICS: No lymphadenopathy CARDIAC: RRR , soft systolic murmur  RESPIRATORY:  Clear to auscultation without rales, wheezing or rhonchi  ABDOMEN: Soft, non-tender, non-distended MUSCULOSKELETAL:  No edema; No deformity  SKIN: Warm and dry NEUROLOGIC:  Alert and oriented x 3     EKG:     EKG Interpretation Date/Time:  Monday August 31 2022 14:16:40 EDT Ventricular Rate:  64 PR Interval:  176 QRS Duration:  82 QT Interval:  410 QTC Calculation: 422 R Axis:   57  Text Interpretation: Normal sinus rhythm Low voltage QRS When compared with ECG of  13-Aug-2019 08:54, No significant change since last tracing Confirmed by Kristeen Miss ([id number]) on 08/31/2022 4:54:24 PM        Recent Labs: 10/01/2021: ALT 24; BUN 15; Creatinine, Ser 0.94; Potassium 4.6; Sodium 140    Lipid Panel    Component Value Date/Time   CHOL 112 10/01/2021 0750   TRIG 62 10/01/2021 0750   HDL 51 10/01/2021 0750   CHOLHDL 2.2 10/01/2021 0750   CHOLHDL 2.7 04/04/2015 0905   VLDL 14 04/04/2015 0905   LDLCALC 47 10/01/2021 0750   LDLDIRECT 147.6 01/27/2011 0902      Wt Readings from Last 3 Encounters:  08/31/22 139 lb 12.8 oz (63.4 kg)  07/21/22 140 lb 2 oz (63.6 kg)  01/26/22 144 lb 3.2 oz (65.4 kg)      Other studies Reviewed: Additional studies/ records that were reviewed today include: . Review of the above records demonstrates:    ASSESSMENT AND PLAN:  1.  Coronary artery disease:  has mild CAC Lipids look great     2.  Hyperlipidemia:   lipids look great    3.  Thyroid disease:    Scheduled for RAI ablation      Current medicines are reviewed at length with the patient today.  The patient does not have concerns regarding medicines.  The following changes have been made:  no change  Labs/ tests ordered today include:   Orders Placed This Encounter  Procedures   Lipid Profile   ALT   Basic metabolic panel   EKG 12-Lead  Disposition:  1 year office visit    Signed, Kristeen Miss, MD  08/31/2022 4:59 PM    [facility] [address] [place], [place], [place]  [address] Phone: [phone]; Fax: [phone]

## 2022-08-31 ENCOUNTER — Encounter: Payer: Self-pay | Admitting: Cardiovascular Disease

## 2022-08-31 ENCOUNTER — Ambulatory Visit: Payer: Medicare HMO | Attending: Cardiovascular Disease | Admitting: Cardiovascular Disease

## 2022-08-31 VITALS — BP 110/66 | HR 64 | Ht 66.0 in | Wt 139.8 lb

## 2022-08-31 DIAGNOSIS — Z79899 Other long term (current) drug therapy: Secondary | ICD-10-CM | POA: Diagnosis not present

## 2022-08-31 DIAGNOSIS — I2510 Atherosclerotic heart disease of native coronary artery without angina pectoris: Secondary | ICD-10-CM | POA: Diagnosis not present

## 2022-08-31 DIAGNOSIS — Z8249 Family history of ischemic heart disease and other diseases of the circulatory system: Secondary | ICD-10-CM

## 2022-08-31 DIAGNOSIS — E782 Mixed hyperlipidemia: Secondary | ICD-10-CM

## 2022-08-31 NOTE — Patient Instructions (Signed)
Medication Instructions:   Your physician recommends that you continue on your current medications as directed. Please refer to the Current Medication list given to you today.  *If you need a refill on your cardiac medications before your next appointment, please call your pharmacy*   Lab Work:  THIS FRIDAY 09/04/22 HERE IN THE OFFICE--LIPIDS, BMET, AND ALT--PLEASE COME FASTING TO THIS LAB APPOINTMENT  If you have labs (blood work) drawn today and your tests are completely normal, you will receive your results only by: MyChart Message (if you have MyChart) OR A paper copy in the mail If you have any lab test that is abnormal or we need to change your treatment, we will call you to review the results.     Follow-Up: At [facility], you and your health needs are our priority.  As part of our continuing mission to provide you with exceptional heart care, we have created designated Provider Care Teams.  These Care Teams include your primary Cardiologist (physician) and Advanced Practice Providers (APPs -  Physician Assistants and Nurse Practitioners) who all work together to provide you with the care you need, when you need it.  We recommend signing up for the patient portal called "MyChart".  Sign up information is provided on this After Visit Summary.  MyChart is used to connect with patients for Virtual Visits (Telemedicine).  Patients are able to view lab/test results, encounter notes, upcoming appointments, etc.  Non-urgent messages can be sent to your provider as well.   To learn more about what you can do with MyChart, go to [email or web address].    Your next appointment:   1 year(s)  Provider:   Kristeen Miss, MD

## 2022-09-04 ENCOUNTER — Ambulatory Visit: Payer: Medicare HMO | Attending: Cardiovascular Disease

## 2022-09-04 DIAGNOSIS — Z8249 Family history of ischemic heart disease and other diseases of the circulatory system: Secondary | ICD-10-CM

## 2022-09-04 DIAGNOSIS — I2510 Atherosclerotic heart disease of native coronary artery without angina pectoris: Secondary | ICD-10-CM

## 2022-09-04 DIAGNOSIS — Z79899 Other long term (current) drug therapy: Secondary | ICD-10-CM

## 2022-09-04 DIAGNOSIS — E782 Mixed hyperlipidemia: Secondary | ICD-10-CM

## 2022-09-04 LAB — BASIC METABOLIC PANEL
BUN/Creatinine Ratio: 21 (ref 12–28)
BUN: 18 mg/dL (ref 8–27)
CO2: 25 mmol/L (ref 20–29)
Calcium: 9 mg/dL (ref 8.7–10.3)
Chloride: 104 mmol/L (ref 96–106)
Creatinine, Ser: 0.87 mg/dL (ref 0.57–1.00)
Glucose: 84 mg/dL (ref 70–99)
Potassium: 4.5 mmol/L (ref 3.5–5.2)
Sodium: 139 mmol/L (ref 134–144)
eGFR: 74 mL/min/{1.73_m2} (ref >59)

## 2022-09-04 LAB — ALT: ALT: 14 IU/L (ref 0–32)

## 2022-09-04 LAB — LIPID PANEL
Chol/HDL Ratio: 1.7 ratio (ref 0.0–4.4)
Cholesterol, Total: 114 mg/dL (ref 100–199)
HDL: 66 mg/dL (ref >39)
LDL Chol Calc (NIH): 34 mg/dL (ref 0–99)
Triglycerides: 62 mg/dL (ref 0–149)
VLDL Cholesterol Cal: 14 mg/dL (ref 5–40)

## 2022-09-09 ENCOUNTER — Telehealth: Payer: Self-pay

## 2022-09-09 NOTE — Telephone Encounter (Signed)
Spoke with patient about recent lab work, no concerns at this time

## 2022-09-09 NOTE — Telephone Encounter (Signed)
-----   Message from Kristeen Miss sent at 09/09/2022  3:47 PM EDT ----- Lipids  looks great LDL is 34  ALT is normal at 14 BMP is within normal limits   Continue current meds

## 2022-10-09 ENCOUNTER — Other Ambulatory Visit: Payer: Self-pay | Admitting: Cardiovascular Disease

## 2023-01-20 DIAGNOSIS — E0590 Thyrotoxicosis, unspecified without thyrotoxic crisis or storm: Secondary | ICD-10-CM | POA: Diagnosis not present

## 2023-03-30 DIAGNOSIS — Z85828 Personal history of other malignant neoplasm of skin: Secondary | ICD-10-CM | POA: Diagnosis not present

## 2023-03-30 DIAGNOSIS — D0472 Carcinoma in situ of skin of left lower limb, including hip: Secondary | ICD-10-CM | POA: Diagnosis not present

## 2023-03-30 DIAGNOSIS — D1801 Hemangioma of skin and subcutaneous tissue: Secondary | ICD-10-CM | POA: Diagnosis not present

## 2023-03-30 DIAGNOSIS — L720 Epidermal cyst: Secondary | ICD-10-CM | POA: Diagnosis not present

## 2023-03-30 DIAGNOSIS — L821 Other seborrheic keratosis: Secondary | ICD-10-CM | POA: Diagnosis not present

## 2023-04-14 DIAGNOSIS — E0590 Thyrotoxicosis, unspecified without thyrotoxic crisis or storm: Secondary | ICD-10-CM | POA: Diagnosis not present

## 2023-04-14 DIAGNOSIS — J029 Acute pharyngitis, unspecified: Secondary | ICD-10-CM | POA: Diagnosis not present

## 2023-04-14 DIAGNOSIS — J069 Acute upper respiratory infection, unspecified: Secondary | ICD-10-CM | POA: Diagnosis not present

## 2023-04-14 DIAGNOSIS — H6691 Otitis media, unspecified, right ear: Secondary | ICD-10-CM | POA: Diagnosis not present

## 2023-05-03 ENCOUNTER — Other Ambulatory Visit: Payer: Self-pay | Admitting: Nurse Practitioner

## 2023-05-03 DIAGNOSIS — E0590 Thyrotoxicosis, unspecified without thyrotoxic crisis or storm: Secondary | ICD-10-CM

## 2023-05-11 ENCOUNTER — Ambulatory Visit
Admission: RE | Admit: 2023-05-11 | Discharge: 2023-05-11 | Disposition: A | Discharge status: Home / Self Care | Source: Ambulatory Visit | Attending: Nurse Practitioner

## 2023-05-11 DIAGNOSIS — E041 Nontoxic single thyroid nodule: Secondary | ICD-10-CM | POA: Diagnosis not present

## 2023-05-11 DIAGNOSIS — E0590 Thyrotoxicosis, unspecified without thyrotoxic crisis or storm: Secondary | ICD-10-CM

## 2023-05-26 ENCOUNTER — Telehealth: Payer: Self-pay | Admitting: Cardiovascular Disease

## 2023-05-26 DIAGNOSIS — Z79899 Other long term (current) drug therapy: Secondary | ICD-10-CM

## 2023-05-26 NOTE — Telephone Encounter (Signed)
 Patient is requesting to have labs completed prior to her appointment with Dr. Alroy Aspen. Patient requested a call back once the lab orders have been placed. Please advise.

## 2023-05-27 NOTE — Telephone Encounter (Signed)
 Labs ordered. Patient is aware

## 2023-06-07 DIAGNOSIS — N952 Postmenopausal atrophic vaginitis: Secondary | ICD-10-CM | POA: Diagnosis not present

## 2023-06-07 DIAGNOSIS — Z780 Asymptomatic menopausal state: Secondary | ICD-10-CM | POA: Diagnosis not present

## 2023-06-07 DIAGNOSIS — Z1231 Encounter for screening mammogram for malignant neoplasm of breast: Secondary | ICD-10-CM | POA: Diagnosis not present

## 2023-06-07 DIAGNOSIS — M8580 Other specified disorders of bone density and structure, unspecified site: Secondary | ICD-10-CM | POA: Diagnosis not present

## 2023-06-07 DIAGNOSIS — Z124 Encounter for screening for malignant neoplasm of cervix: Secondary | ICD-10-CM | POA: Diagnosis not present

## 2023-06-07 DIAGNOSIS — Z6824 Body mass index (BMI) 24.0-24.9, adult: Secondary | ICD-10-CM | POA: Diagnosis not present

## 2023-06-25 ENCOUNTER — Other Ambulatory Visit: Payer: Self-pay | Admitting: *Deleted

## 2023-06-25 DIAGNOSIS — Z79899 Other long term (current) drug therapy: Secondary | ICD-10-CM | POA: Diagnosis not present

## 2023-06-25 LAB — BASIC METABOLIC PANEL WITH GFR
BUN/Creatinine Ratio: 16 (ref 12–28)
BUN: 14 mg/dL (ref 8–27)
CO2: 19 mmol/L — ABNORMAL LOW (ref 20–29)
Calcium: 9.3 mg/dL (ref 8.7–10.3)
Chloride: 102 mmol/L (ref 96–106)
Creatinine, Ser: 0.89 mg/dL (ref 0.57–1.00)
Glucose: 83 mg/dL (ref 70–99)
Potassium: 4.6 mmol/L (ref 3.5–5.2)
Sodium: 138 mmol/L (ref 134–144)
eGFR: 71 mL/min/{1.73_m2} (ref >59)

## 2023-06-26 LAB — LIPID PANEL
Chol/HDL Ratio: 2 ratio (ref 0.0–4.4)
Cholesterol, Total: 121 mg/dL (ref 100–199)
HDL: 62 mg/dL (ref >39)
LDL Chol Calc (NIH): 44 mg/dL (ref 0–99)
Triglycerides: 72 mg/dL (ref 0–149)
VLDL Cholesterol Cal: 15 mg/dL (ref 5–40)

## 2023-06-26 LAB — ALT: ALT: 16 IU/L (ref 0–32)

## 2023-06-27 ENCOUNTER — Ambulatory Visit: Payer: Self-pay | Admitting: Cardiovascular Disease

## 2023-06-27 NOTE — Progress Notes (Unsigned)
 Cardiology Office Note   Date:  06/29/2023   ID:  [name], DOB [birth year], MRN [id number]  PCP:  Pamela Hamilton, MD  Cardiologist:   Pamela Passe, MD   Chief Complaint  Patient presents with   Coronary Artery Disease        Hyperlipidemia   Problem List: 1. Hypercholesterolemia 2. Family Hx of CAD  He's been quite active. She's back playing tennis and is doing well. She denies any chest pain or shortness breath.  She works out on a regular basis and is able to achieve a peak heart rate 185 160 without too much difficulty. She works out for 30 minutes at a time doing these exercises.  She's also walking in the evenings with her husband.   March 23, 2013:   [name] is doing OK.  She has stopped her Atrovastatin.  - she ran out and could not get back into see me.    Her LDL was 181 this last time.  Her previous LDL was 108.    She admits that her diet has not been as good and she has not been working out as much.   March 23, 2014:  [name] is a 68 y.o. female who presents for follow up of her hyperlipidemia.    She's done very well. She's exercising readily. She's lost a little bit of weight. She's not having any episodes of chest pain.  April 05, 2015:  [name] is seen today for follow up of her hyperlipdiemia. Staying active - golf and tennis regularly .  Walks regularly BP is up slighlty .  March 05, 2016:  Presents with stabbing like chest pain.  Quick jabs. At rest . Doesn't increase with activity. Has not been exercising as much.   Walking does not increase pain .   Not related to eating.  Not related to taking a deep breath.   Has gained some weight ( 10 lbs )  Has not been taking the atorvastatin  as regularly  Has been present for 3 weeks .   Occurs almost daily .   May 04, 2017:  Doing well Working out regularly .  Feeling well  No chest pains or shortness of breath. Her last lipid levels were overall okay but her LDL is still 101.  I would  like for her LDL to be around 70 since she has a family here history of early coronary disease in her mother. She admits that she does not take her atorvastatin  every day and thinks she misses doses  Most  weekends.  Oct. 9, 2020 [name] is seen today for follow of her CP and hyperlipidemia. Has been busy with doctors visit s Playing golf and tennis .  No cp.  Hikes occasionally  Tolerating the crestor  well  Sept. 23, 2021:  [name] is seen today for follow up  She has had persistent dyspnea. She was hospitalized a month ago covid test was negative. Right heart cath was normal She had a negative CT angiogram of the chest.  No evidence of pulmonary embolus.  She was thought to have asthma , started on inhalers,  These caused her to have tachycardia - felt jittery,  did not feel any better  PFTs were normal   She thinks her dyspnea  may be all due to allergies.    She stopped all her pulmonary meds several days ago .  Still on rosuvastatin    We have suggested cardiopulmonary stress testing at some point .  Nov. 16, 2021  [name] is seen today for follow up of her worsening dyspnea She has been found to have Graves disease She had not improved despite being on Methimazol for 4 weeks and Metoprolol  / Propranolol  for 1-2 months   I have reviewed her echo and Coronary Ct angio from Aug., 2021  She is seen back today to make sure there is no cardiac component to her dyspnea.  Her mother had a hx of premature CAD  Started Methimazole on Oct. 20, 2021:  We started propranolol  and then Metoprolol  mid Oct.  She is on Metoprolol  XL 25 mg a day .   We have encouraged her to take some propranolol  along with the metoprolol  if she has palpitations   She was given hydroxyzine  by pulmonary yesterday  Has not been able to get it yet ( trouble at the pharmacy)    July 23, 2020:  [name] is seen today for follow up of her HLD and Graves disease. Outside records show a very elevated TSH  several months ago  Her tapazole dose is slowly being reduced.  TSH is now WNL  Doing well  Is off Toprol  XL On Rosuvastatin  20 mg a day  Her coronary calcium  score is 77 which places her in the 84th percentile for age and gender matched controls. LDL in Jan. 2021 was 74.   August 04, 2021 [name] is seen today for follow up of her HLD, coronary calcifications, graves disease.  Coronary CTA from 08/14/19 Coronary calcium  score is 77, which places the patient in the 84th percentile for age and sex matched control. RCA - normal  LM - mild plaque LAD mild - mod disease Ramus - normal LCx - small and non dominate  Normal sized aorta   Labs from 08/01/21 were reviewed LDL is 62 HDL is 60 Trigs are 109   Was on zetia  10 mg a day but ran out 2 weeks ago .    Add Apo B or LP (a)  Has been told she needs to have RAI ablation for her Graves disease     Aug. 26, 2024 [name] is seen for follow up of her HLD, mild CAD ( coronary calciications )  CAC is 77  has mild- moderate disease in the LAD  Labs from Sept. 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   peicemeal polyp    Past Surgical History:  Procedure Laterality Date   BLADDER SURGERY     CHOLECYSTECTOMY  2012   laparoscopic   EUS  02/11/2011   Procedure: UPPER ENDOSCOPIC ULTRASOUND (EUS) LINEAR;  Surgeon: Pamela CHRISTELLA Cree, MD;  Location: [facility]  ENDOSCOPY;  Service: Endoscopy;  Laterality: N/A;  mac   RIGHT HEART  CATH N/A 08/15/2019   Procedure: RIGHT HEART CATH;  Surgeon: Cherrie Toribio SAUNDERS, MD;  Location: [facility] INVASIVE CV LAB;  Service: Cardiovascular;  Laterality: N/A;   US  ECHOCARDIOGRAPHY  11/03/2006   EF 55-60%     Current Outpatient Medications  Medication Sig Dispense Refill   Cholecalciferol  (VITAMIN D3) 50 MCG (2000 UT) TABS Take 2,000 Units by mouth daily.     ezetimibe  (ZETIA ) 10 MG tablet TAKE 1 TABLET BY MOUTH EVERY DAY 90 tablet 3   ibuprofen (ADVIL,MOTRIN) 200 MG tablet Take 200-600 mg by mouth every 6 (six) hours as needed for headache, mild pain or moderate pain.      methimazole (TAPAZOLE) 10 MG tablet Take 1 tablet by mouth daily. After breakfast and after dinner     rosuvastatin  (CRESTOR ) 20 MG tablet TAKE 1 TABLET BY MOUTH EVERY DAY 90 tablet 3   No current facility-administered medications for this visit.    Allergies:   Influenza vaccines    Social History:  The patient  reports that she has never smoked. She has never used smokeless tobacco. She reports that she does not drink alcohol  and does not use drugs.   Family History:  The patient's family history includes Cancer in her paternal grandmother; Heart disease in her mother; Hypertension in her father and mother.    ROS: Noted in current history, otherwise negative.     Physical Exam: Blood pressure 112/86, pulse (!) 55, height 5' 6 (1.676 m), weight 114 lb 12.8 oz (52.1 kg), SpO2 96%.       GEN:  Well nourished, well developed in no acute distress HEENT: Normal NECK: No JVD; No carotid bruits LYMPHATICS: No lymphadenopathy CARDIAC: RRR , no murmurs, rubs, gallops RESPIRATORY:  Clear to auscultation without rales, wheezing or rhonchi  ABDOMEN: Soft, non-tender, non-distended MUSCULOSKELETAL:  No edema; No deformity  SKIN: Warm and dry NEUROLOGIC:  Alert and oriented x 3     EKG:              Recent Labs: 06/25/2023: ALT 16;  BUN 14; Creatinine, Ser 0.89; Potassium 4.6; Sodium 138    Lipid Panel    Component Value Date/Time   CHOL 121 06/25/2023 0920   TRIG 72 06/25/2023 0920   HDL 62 06/25/2023 0920   CHOLHDL 2.0 06/25/2023 0920   CHOLHDL 2.7 04/04/2015 0905   VLDL 14 04/04/2015 0905   LDLCALC 44 06/25/2023 0920   LDLDIRECT 147.6 01/27/2011 0902      Wt Readings from Last 3 Encounters:  06/29/23 114 lb 12.8 oz (52.1 kg)  08/31/22 139 lb 12.8 oz (63.4 kg)  07/21/22 140 lb 2 oz (63.6 kg)      Other studies Reviewed: Additional studies/ records that were reviewed today include: . Review of the above records demonstrates:    ASSESSMENT AND PLAN:  1.  Coronary artery disease:   strong family hx of premature CAD  She has mild CAD by coronary CTA .  CAC score is 77  Her LDL goal is < 70 Her current LDL is 44  Continue current medications and current plans   2.  Hyperlipidemia:   lipids are well controlled    3.  Thyroid  disease:      She has a right thyroid  nodule.   She needs to discuss this further with Dr. Balan. I've suggest that she discuss this potentially with Dr Krystal Spinner who specializes in thyroid  surgery        Current medicines are reviewed at  length with the patient today.  The patient does not have concerns regarding medicines.  The following changes have been made:  no change  Labs/ tests ordered today include:   No orders of the defined types were placed in this encounter.     Disposition:       Signed, Pamela Passe, MD  06/29/2023 7:45 PM    [facility] [address] [place], [place], [place]  [address] Phone: [phone]; Fax: [phone]

## 2023-06-28 DIAGNOSIS — L500 Allergic urticaria: Secondary | ICD-10-CM | POA: Diagnosis not present

## 2023-06-28 DIAGNOSIS — Z85828 Personal history of other malignant neoplasm of skin: Secondary | ICD-10-CM | POA: Diagnosis not present

## 2023-06-29 ENCOUNTER — Encounter: Payer: Self-pay | Admitting: Cardiovascular Disease

## 2023-06-29 ENCOUNTER — Ambulatory Visit: Attending: Cardiovascular Disease | Admitting: Cardiovascular Disease

## 2023-06-29 VITALS — BP 112/86 | HR 55 | Ht 66.0 in | Wt 141.0 lb

## 2023-06-29 DIAGNOSIS — E782 Mixed hyperlipidemia: Secondary | ICD-10-CM

## 2023-06-29 DIAGNOSIS — I2510 Atherosclerotic heart disease of native coronary artery without angina pectoris: Secondary | ICD-10-CM

## 2023-06-29 NOTE — Patient Instructions (Signed)
 Follow-Up: At [facility], you and your health needs are our priority.  As part of our continuing mission to provide you with exceptional heart care, our providers are all part of one team.  This team includes your primary Cardiologist (physician) and Advanced Practice Providers or APPs (Physician Assistants and Nurse Practitioners) who all work together to provide you with the care you need, when you need it.  Your next appointment:   1 Year  Provider:   Shelda Bruckner, MD

## 2023-06-30 ENCOUNTER — Telehealth: Payer: Self-pay | Admitting: Cardiovascular Disease

## 2023-06-30 DIAGNOSIS — E0500 Thyrotoxicosis with diffuse goiter without thyrotoxic crisis or storm: Secondary | ICD-10-CM | POA: Diagnosis not present

## 2023-06-30 NOTE — Telephone Encounter (Signed)
 Patient identification verified by 2 forms. Shirl Ellen, RN     Called and spoke to patient  Patient states:  - he weight was entered wrong at her office visit yesterday. It was entered as 114lb and should be 141lb.                Interventions/Plan: - Addendum to encounter from yesterdays office visit to show correct weight.    Patient agrees with plan, no questions at this time

## 2023-06-30 NOTE — Telephone Encounter (Signed)
 Pt states her weight was entered wrong. Please update and give pt a call

## 2023-07-12 DIAGNOSIS — E785 Hyperlipidemia, unspecified: Secondary | ICD-10-CM | POA: Diagnosis not present

## 2023-07-12 DIAGNOSIS — E0590 Thyrotoxicosis, unspecified without thyrotoxic crisis or storm: Secondary | ICD-10-CM | POA: Diagnosis not present

## 2023-07-15 DIAGNOSIS — Z1152 Encounter for screening for COVID-19: Secondary | ICD-10-CM | POA: Diagnosis not present

## 2023-07-15 DIAGNOSIS — J028 Acute pharyngitis due to other specified organisms: Secondary | ICD-10-CM | POA: Diagnosis not present

## 2023-07-15 DIAGNOSIS — J029 Acute pharyngitis, unspecified: Secondary | ICD-10-CM | POA: Diagnosis not present

## 2023-07-15 DIAGNOSIS — K219 Gastro-esophageal reflux disease without esophagitis: Secondary | ICD-10-CM | POA: Diagnosis not present

## 2023-07-15 DIAGNOSIS — R599 Enlarged lymph nodes, unspecified: Secondary | ICD-10-CM | POA: Diagnosis not present

## 2023-07-15 DIAGNOSIS — R5383 Other fatigue: Secondary | ICD-10-CM | POA: Diagnosis not present

## 2023-07-15 DIAGNOSIS — R058 Other specified cough: Secondary | ICD-10-CM | POA: Diagnosis not present

## 2023-07-15 DIAGNOSIS — B9689 Other specified bacterial agents as the cause of diseases classified elsewhere: Secondary | ICD-10-CM | POA: Diagnosis not present

## 2023-08-04 ENCOUNTER — Ambulatory Visit: Payer: Self-pay | Admitting: Surgery

## 2023-08-04 DIAGNOSIS — E042 Nontoxic multinodular goiter: Secondary | ICD-10-CM | POA: Diagnosis not present

## 2023-08-04 DIAGNOSIS — E0590 Thyrotoxicosis, unspecified without thyrotoxic crisis or storm: Secondary | ICD-10-CM | POA: Diagnosis not present

## 2023-08-23 NOTE — Patient Instructions (Addendum)
 SURGICAL WAITING ROOM VISITATION  Patients having surgery or a procedure may have no more than 2 support people in the waiting area - these visitors may rotate.    Children under the age of 60 must have an adult with them who is not the patient.  Visitors with respiratory illnesses are discouraged from visiting and should remain at home.  If the patient needs to stay at the hospital during part of their recovery, the visitor guidelines for inpatient rooms apply. Pre-op nurse will coordinate an appropriate time for 1 support person to accompany patient in pre-op.  This support person may not rotate.    Please refer to the [facility] website for the visitor guidelines for Inpatients (after your surgery is over and you are in a regular room).       Your procedure is scheduled on:  09/03/23    Report to [facility] Main Entrance    Report to admitting at   0730AM   Call this number if you have problems the morning of surgery [phone]   Do not eat food :After Midnight.   After Midnight you may have the following liquids until _ 0630_____ Am  DAY OF SURGERY  Water Non-Citrus Juices (without pulp, NO RED-Apple, White grape, White cranberry) Black Coffee (NO MILK/CREAM OR CREAMERS, sugar ok)  Clear Tea (NO MILK/CREAM OR CREAMERS, sugar ok) regular and decaf                             Plain Jell-O (NO RED)                                           Fruit ices (not with fruit pulp, NO RED)                                     Popsicles (NO RED)                                                               Sports drinks like Gatorade (NO RED)                    The day of surgery:  Drink ONE (1) Pre-Surgery Clear Ensure or G2 at  0630AM the morning of surgery. Drink in one sitting. Do not sip.  This drink was given to you during your hospital  pre-op appointment visit. Nothing else to drink after completing the  Pre-Surgery Clear Ensure or G2.          If you have  questions, please contact your surgeon's office.      Oral Hygiene is also important to reduce your risk of infection.                                    Remember - BRUSH YOUR TEETH THE MORNING OF SURGERY WITH YOUR REGULAR TOOTHPASTE  DENTURES WILL BE REMOVED PRIOR TO SURGERY PLEASE DO NOT APPLY Poly grip OR ADHESIVES!!!  Do NOT smoke after Midnight   Stop all vitamins and herbal supplements 7 days before surgery.   Take these medicines the morning of surgery with A SIP OF WATER:   none   DO NOT TAKE ANY ORAL DIABETIC MEDICATIONS DAY OF YOUR SURGERY  Bring CPAP mask and tubing day of surgery.                              You may not have any metal on your body including hair pins, jewelry, and body piercing             Do not wear make-up, lotions, powders, perfumes/cologne, or deodorant  Do not wear nail polish including gel and S&S, artificial/acrylic nails, or any other type of covering on natural nails including finger and toenails. If you have artificial nails, gel coating, etc. that needs to be removed by a nail salon please have this removed prior to surgery or surgery may need to be canceled/ delayed if the surgeon/ anesthesia feels like they are unable to be safely monitored.   Do not shave  48 hours prior to surgery.               Men may shave face and neck.   Do not bring valuables to the hospital. [facility] IS NOT             RESPONSIBLE   FOR VALUABLES.   Contacts, glasses, dentures or bridgework may not be worn into surgery.   Bring small overnight bag day of surgery.   DO NOT BRING YOUR HOME MEDICATIONS TO THE HOSPITAL. PHARMACY WILL DISPENSE MEDICATIONS LISTED ON YOUR MEDICATION LIST TO YOU DURING YOUR ADMISSION IN THE HOSPITAL!    Patients discharged on the day of surgery will not be allowed to drive home.  Someone NEEDS to stay with you for the first 24 hours after anesthesia.   Special Instructions: Bring a copy of your healthcare power of attorney  and living will documents the day of surgery if you haven't scanned them before.              Please read over the following fact sheets you were given: IF YOU HAVE QUESTIONS ABOUT YOUR PRE-OP INSTRUCTIONS PLEASE CALL [phone].   If you received a COVID test during your pre-op visit  it is requested that you wear a mask when out in public, stay away from anyone that may not be feeling well and notify your surgeon if you develop symptoms. If you test positive for Covid or have been in contact with anyone that has tested positive in the last 10 days please notify you surgeon.    [facility] - Preparing for Surgery Before surgery, you can play an important role.  Because skin is not sterile, your skin needs to be as free of germs as possible.  You can reduce the number of germs on your skin by washing with CHG (chlorahexidine gluconate) soap before surgery.  CHG is an antiseptic cleaner which kills germs and bonds with the skin to continue killing germs even after washing. Please DO NOT use if you have an allergy to CHG or antibacterial soaps.  If your skin becomes reddened/irritated stop using the CHG and inform your nurse when you arrive at Short Stay. Do not shave (including legs and underarms) for at least 48 hours prior to the first CHG shower.  You may shave your face/neck. Please follow  these instructions carefully:  1.  Shower with CHG Soap the night before surgery and the  morning of Surgery.  2.  If you choose to wash your hair, wash your hair first as usual with your  normal  shampoo.  3.  After you shampoo, rinse your hair and body thoroughly to remove the  shampoo.                           4.  Use CHG as you would any other liquid soap.  You can apply chg directly  to the skin and wash                       Gently with a scrungie or clean washcloth.  5.  Apply the CHG Soap to your body ONLY FROM THE NECK DOWN.   Do not use on face/ open                           Wound or open sores. Avoid  contact with eyes, ears mouth and genitals (private parts).                       Wash face,  Genitals (private parts) with your normal soap.             6.  Wash thoroughly, paying special attention to the area where your surgery  will be performed.  7.  Thoroughly rinse your body with warm water from the neck down.  8.  DO NOT shower/wash with your normal soap after using and rinsing off  the CHG Soap.                9.  Pat yourself dry with a clean towel.            10.  Wear clean pajamas.            11.  Place clean sheets on your bed the night of your first shower and do not  sleep with pets. Day of Surgery : Do not apply any lotions/deodorants the morning of surgery.  Please wear clean clothes to the hospital/surgery center.  FAILURE TO FOLLOW THESE INSTRUCTIONS MAY RESULT IN THE CANCELLATION OF YOUR SURGERY PATIENT SIGNATURE_________________________________  NURSE SIGNATURE__________________________________  ________________________________________________________________________

## 2023-08-23 NOTE — Progress Notes (Signed)
 Anesthesia Review:  PCP: Cardiologist :Nahser- LVO 06/29/23   PPM/ ICD: Device Orders: Rep Notified:  Chest x-ray : EKG : 08/31/22  Echo : 2021  Stress test: 2021  Cath- 2021  CT Cors- 2021  Cardiac Cath :   Activity level:  Sleep Study/ CPAP : Fasting Blood Sugar :      / Checks Blood Sugar -- times a day:    Blood Thinner/ Instructions /Last Dose: ASA / Instructions/ Last Dose :

## 2023-08-24 DIAGNOSIS — K5730 Diverticulosis of large intestine without perforation or abscess without bleeding: Secondary | ICD-10-CM | POA: Diagnosis not present

## 2023-08-24 DIAGNOSIS — Z09 Encounter for follow-up examination after completed treatment for conditions other than malignant neoplasm: Secondary | ICD-10-CM | POA: Diagnosis not present

## 2023-08-24 DIAGNOSIS — Z860101 Personal history of adenomatous and serrated colon polyps: Secondary | ICD-10-CM | POA: Diagnosis not present

## 2023-08-25 ENCOUNTER — Encounter (HOSPITAL_COMMUNITY): Payer: Self-pay

## 2023-08-25 ENCOUNTER — Other Ambulatory Visit: Payer: Self-pay

## 2023-08-25 ENCOUNTER — Encounter (HOSPITAL_COMMUNITY)
Admission: RE | Admit: 2023-08-25 | Discharge: 2023-08-25 | Disposition: A | Discharge status: Home / Self Care | Source: Ambulatory Visit | Attending: Surgery | Admitting: Surgery

## 2023-08-25 VITALS — BP 127/81 | HR 70 | Temp 98.2°F | Resp 16 | Ht 66.0 in | Wt 142.0 lb

## 2023-08-25 DIAGNOSIS — E042 Nontoxic multinodular goiter: Secondary | ICD-10-CM | POA: Insufficient documentation

## 2023-08-25 DIAGNOSIS — E0520 Thyrotoxicosis with toxic multinodular goiter without thyrotoxic crisis or storm: Secondary | ICD-10-CM | POA: Diagnosis not present

## 2023-08-25 DIAGNOSIS — Z01818 Encounter for other preprocedural examination: Secondary | ICD-10-CM | POA: Insufficient documentation

## 2023-08-25 HISTORY — DX: Cardiac murmur, unspecified: R01.1

## 2023-08-25 HISTORY — DX: Malignant (primary) neoplasm, unspecified: C80.1

## 2023-08-25 LAB — BASIC METABOLIC PANEL WITH GFR
Anion gap: 8 (ref 5–15)
BUN: 21 mg/dL (ref 8–23)
CO2: 24 mmol/L (ref 22–32)
Calcium: 9.3 mg/dL (ref 8.9–10.3)
Chloride: 104 mmol/L (ref 98–111)
Creatinine, Ser: 0.87 mg/dL (ref 0.44–1.00)
GFR, Estimated: >60 mL/min (ref >60)
Glucose, Bld: 100 mg/dL — ABNORMAL HIGH (ref 70–99)
Potassium: 3.6 mmol/L (ref 3.5–5.1)
Sodium: 136 mmol/L (ref 135–145)

## 2023-08-25 LAB — CBC
HCT: 41.7 % (ref 36.0–46.0)
Hemoglobin: 13.8 g/dL (ref 12.0–15.0)
MCH: 30.2 pg (ref 26.0–34.0)
MCHC: 33.1 g/dL (ref 30.0–36.0)
MCV: 91.2 fL (ref 80.0–100.0)
Platelets: 303 K/uL (ref 150–400)
RBC: 4.57 MIL/uL (ref 3.87–5.11)
RDW: 12.8 % (ref 11.5–15.5)
WBC: 6.2 K/uL (ref 4.0–10.5)
nRBC: 0 % (ref 0.0–0.2)

## 2023-08-26 NOTE — Progress Notes (Signed)
 Anesthesia Chart Review   Case: [id number] Date/Time: 09/03/23 0915   Procedure: THYROIDECTOMY - TOTAL THYROIDECTOMY   Anesthesia type: General   Diagnosis:      Multiple thyroid  nodules [E04.2]     Hyperthyroidism [E05.90]   Pre-op diagnosis:      MULTIPLE THYROID  NODULES     HYPERTHYROIDISM   Location: [facility] ROOM 01 / [facility]   Surgeons: Eletha Boas, MD       DISCUSSION:68 y.o. never smoker with h/o hyperthyroidism, multiple thyroid  nodules scheduled for above procedure 09/03/2023 with Dr. Boas Eletha.   Pt last seen by cardio 06/29/2023. Per notes pt is very active achieves greater than 4 METS. Low risk stress test 2021.  VS: BP 127/81   Pulse 70   Temp 36.8 C (Oral)   Resp 16   Ht 5' 6 (1.676 m)   Wt 64.4 kg   SpO2 100%   BMI 22.92 kg/m   PROVIDERS: Larnell Hamilton, MD is PCP    LABS: Labs reviewed: Acceptable for surgery. (all labs ordered are listed, but only abnormal results are displayed)  Labs Reviewed  BASIC METABOLIC PANEL WITH GFR - Abnormal; Notable for the following components:      Result Value   Glucose, Bld 100 (*)    All other components within normal limits  CBC     IMAGES:   EKG:   CV: Myocardial Perfusion 11/22/2019  Nuclear stress EF: 72%. There was no ST segment deviation noted during stress. No T wave inversion was noted during stress. The study is normal. This is a low risk study. The left ventricular ejection fraction is hyperdynamic (>65%).   1. There are reduced counts in the apex that improve on stress imaging with normal wall motion consistent with apical thinning artifact.  2. No ischemia or infarction.  3. Normal LVEF, >65%.  4. This is a low risk study.     Echo 08/13/2019 1. Normal Echo.   2. Left ventricular ejection fraction, by estimation, is 60 to 65%. The  left ventricle has normal function. The left ventricle has no regional  wall motion abnormalities. Left ventricular diastolic parameters were  normal.   3.  Right ventricular systolic function is normal. The right ventricular  size is normal. There is normal pulmonary artery systolic pressure.   4. The mitral valve is normal in structure. No evidence of mitral valve  regurgitation. No evidence of mitral stenosis.   5. The aortic valve is normal in structure. Aortic valve regurgitation is  not visualized. No aortic stenosis is present.  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   peicemeal polyp    Past Surgical History:  Procedure Laterality Date   BLADDER SURGERY     CHOLECYSTECTOMY  2012   laparoscopic   EUS  02/11/2011   Procedure: UPPER ENDOSCOPIC ULTRASOUND (EUS) LINEAR;  Surgeon: Elsie CHRISTELLA Cree, MD;  Location: [facility]OSCOPY;  Service: Endoscopy;  Laterality: N/A;  mac   RIGHT HEART CATH N/A 08/15/2019   Procedure: RIGHT HEART CATH;  Surgeon: Cherrie Toribio SAUNDERS, MD;  Location: [facility] INVASIVE CV LAB;  Service: Cardiovascular;  Laterality: N/A;   US  ECHOCARDIOGRAPHY  11/03/2006   EF 55-60%    MEDICATIONS:  ezetimibe  (ZETIA ) 10 MG tablet   methimazole (TAPAZOLE)  10 MG tablet   rosuvastatin  (CRESTOR ) 20 MG tablet   No current facility-administered medications for this encounter.     Harlene Hoots Ward, PA-C [facility] Pre-Surgical Testing [phone]

## 2023-08-30 DIAGNOSIS — E041 Nontoxic single thyroid nodule: Secondary | ICD-10-CM | POA: Diagnosis not present

## 2023-08-30 DIAGNOSIS — E0500 Thyrotoxicosis with diffuse goiter without thyrotoxic crisis or storm: Secondary | ICD-10-CM | POA: Diagnosis not present

## 2023-08-30 DIAGNOSIS — E559 Vitamin D deficiency, unspecified: Secondary | ICD-10-CM | POA: Diagnosis not present

## 2023-08-30 DIAGNOSIS — Z713 Dietary counseling and surveillance: Secondary | ICD-10-CM | POA: Diagnosis not present

## 2023-09-01 ENCOUNTER — Encounter (HOSPITAL_COMMUNITY): Payer: Self-pay | Admitting: Surgery

## 2023-09-01 DIAGNOSIS — E0520 Thyrotoxicosis with toxic multinodular goiter without thyrotoxic crisis or storm: Secondary | ICD-10-CM | POA: Diagnosis present

## 2023-09-01 NOTE — H&P (Signed)
 REFERRING PHYSICIAN: Roy Raisin, NP  PROVIDER: Einar Nolasco OZELL SPINNER, MD   Chief Complaint: New Consultation (Hyperthyroidism, multiple thyroid  nodules)  History of Present Illness:  Patient is referred by Raisin Roy, NP, for surgical evaluation and management of hyperthyroidism with multiple thyroid  nodules. Patient was originally diagnosed in 2021 when she became symptomatic with hyperthyroidism. Laboratory studies demonstrated a suppressed TSH level. She was started on methimazole. Patient has bilateral thyroid  nodules. She has undergone previous fine-needle aspiration of the dominant nodule on the right with benign cytopathology. Recent ultrasound in May 2025 shows interval enlargement of the dominant nodule on the right side, now measuring 4 cm in greatest dimension. Patient has no prior history of head or neck surgery. She had never been on thyroid  medication. There is a family history of hypothyroidism in the patient's mother. Patient is not currently taking a beta-blocker. Patient is now referred for consideration for thyroidectomy for definitive management of hyperthyroidism with multiple thyroid  nodules.  Review of Systems: A complete review of systems was obtained from the patient. I have reviewed this information and discussed as appropriate with the patient. See HPI as well for other ROS.  Review of Systems  Constitutional: Negative.  HENT: Negative.  Eyes: Negative.  Respiratory: Negative.  Cardiovascular: Positive for palpitations.  Gastrointestinal: Negative.  Genitourinary: Negative.  Musculoskeletal: Negative.  Skin: Negative.  Neurological: Positive for tremors.  Endo/Heme/Allergies: Negative.  Psychiatric/Behavioral: Negative.    Medical History: Past Medical History:  Diagnosis Date  Hyperlipidemia  Thyroid  disease   Patient Active Problem List  Diagnosis  Multiple thyroid  nodules  Hyperthyroidism   Past Surgical History:  Procedure  Laterality Date  CHOLECYSTECTOMY    No Known Allergies  Current Outpatient Medications on File Prior to Visit  Medication Sig Dispense Refill  ezetimibe  (ZETIA ) 10 mg tablet Take 10 mg by mouth once daily  methIMAzole (TAPAZOLE) 10 MG tablet 1 TABLET ORALLY ONCE A DAY X4 DAYS/WEEK AND SKIP 3 DAYS/WEEK 30 DAYS; Duration: 30  rosuvastatin  (CRESTOR ) 20 MG tablet 1 capsule   No current facility-administered medications on file prior to visit.   Family History  Problem Relation Age of Onset  Hyperlipidemia (Elevated cholesterol) Mother  Coronary Artery Disease (Blocked arteries around heart) Mother  High blood pressure (Hypertension) Father  Hyperlipidemia (Elevated cholesterol) Father    Social History   Tobacco Use  Smoking Status Never  Smokeless Tobacco Never    Social History   Socioeconomic History  Marital status: Married  Tobacco Use  Smoking status: Never  Smokeless tobacco: Never  Substance and Sexual Activity  Drug use: Never   Social Drivers of Health   Food Insecurity: No Food Insecurity (01/26/2022)  Received from [facility]  Hunger Vital Sign  Within the past 12 months, you worried that your food would run out before you got the money to buy more.: Never true  Within the past 12 months, the food you bought just didn't last and you didn't have money to get more.: Never true  Transportation Needs: No Transportation Needs (01/26/2022)  Received from [facility]  Lack of Transportation (Medical): No  Lack of Transportation (Non-Medical): No  Housing Stability: Unknown (08/04/2023)  Housing Stability Vital Sign  Homeless in the Last Year: No   Objective:   Vitals:  BP: 117/80  Pulse: 81  Temp: 36.6 C (97.9 F)  SpO2: 98%  Weight: 65.9 kg (145 lb 3.2 oz)  Height: 167.6 cm (5' 6)  PainSc: 0-No pain  Body mass index is 23.44 kg/m.  Physical Exam   GENERAL APPEARANCE Comfortable, no acute issues Development:  normal Gross deformities: none  SKIN Rash, lesions, ulcers: none Induration, erythema: none Nodules: none palpable  EYES Conjunctiva and lids: normal Pupils: equal  EARS, NOSE, MOUTH, THROAT External ears: no lesion or deformity External nose: no lesion or deformity Hearing: grossly normal  NECK Symmetric: no Trachea: midline Thyroid : There is a dominant, visible, palpable nodule on the right side which is smooth, discrete, mobile, measuring about 4 cm in greatest dimension. Left thyroid  lobe is slightly nodular to palpation without discrete or dominant mass. There is no associated lymphadenopathy.  CHEST/CV Not assessed  ABDOMEN Not assessed  GENITOURINARY/RECTAL Not assessed  MUSCULOSKELETAL Station and gait: normal Digits and nails: no clubbing or cyanosis Muscle strength: grossly normal all extremities Deformity: none  LYMPHATIC Cervical: none palpable Supraclavicular: none palpable  PSYCHIATRIC Oriented to person, place, and time: yes Mood and affect: normal for situation Judgment and insight: appropriate for situation   Assessment and Plan:   Multiple thyroid  nodules Hyperthyroidism  Patient is referred by her endocrinologist for surgical evaluation and management of hyperthyroidism with multiple thyroid  nodules.  Patient provided with a copy of The Thyroid  Book: Medical and Surgical Treatment of Thyroid  Problems, published by [employer], 16 pages. Book reviewed and explained to patient during visit today.  Today we reviewed her clinical history. We reviewed recent laboratory studies. We reviewed her most recent ultrasound examination. We discussed management of hyperthyroidism. In general, hyperthyroidism in the setting of multiple thyroid  nodules is best managed with total thyroidectomy. We briefly discussed radioactive iodine ablation. We discussed total thyroidectomy in detail. We discussed the size and location of the surgical incision. We discussed  the hospital stay to be anticipated. We discussed risk and benefits of surgery including the risk of recurrent laryngeal nerve injury and injury to parathyroid glands. We discussed the need for lifelong thyroid  hormone replacement. We discussed the postoperative recovery and return to work and activities. The patient understands and wishes to proceed with surgery in the near future.  Krystal Spinner, MD [facility] practice Office: [phone]

## 2023-09-03 ENCOUNTER — Ambulatory Visit (HOSPITAL_COMMUNITY)
Admission: RE | Admit: 2023-09-03 | Discharge: 2023-09-04 | Disposition: A | Discharge status: Home / Self Care | Attending: Surgery | Admitting: Surgery

## 2023-09-03 ENCOUNTER — Other Ambulatory Visit: Payer: Self-pay

## 2023-09-03 ENCOUNTER — Ambulatory Visit (HOSPITAL_COMMUNITY): Payer: Self-pay | Admitting: Medical

## 2023-09-03 ENCOUNTER — Ambulatory Visit (HOSPITAL_BASED_OUTPATIENT_CLINIC_OR_DEPARTMENT_OTHER): Payer: Self-pay | Admitting: Certified Registered"

## 2023-09-03 ENCOUNTER — Encounter (HOSPITAL_COMMUNITY): Payer: Self-pay | Admitting: Surgery

## 2023-09-03 ENCOUNTER — Encounter (HOSPITAL_COMMUNITY)
Admission: RE | Disposition: A | Discharge status: Home / Self Care | Payer: Self-pay | Source: Home / Self Care | Attending: Surgery

## 2023-09-03 DIAGNOSIS — K219 Gastro-esophageal reflux disease without esophagitis: Secondary | ICD-10-CM | POA: Diagnosis not present

## 2023-09-03 DIAGNOSIS — E0520 Thyrotoxicosis with toxic multinodular goiter without thyrotoxic crisis or storm: Secondary | ICD-10-CM | POA: Diagnosis not present

## 2023-09-03 DIAGNOSIS — Z8349 Family history of other endocrine, nutritional and metabolic diseases: Secondary | ICD-10-CM | POA: Insufficient documentation

## 2023-09-03 DIAGNOSIS — Z8249 Family history of ischemic heart disease and other diseases of the circulatory system: Secondary | ICD-10-CM | POA: Diagnosis not present

## 2023-09-03 DIAGNOSIS — E063 Autoimmune thyroiditis: Secondary | ICD-10-CM | POA: Diagnosis not present

## 2023-09-03 DIAGNOSIS — I2510 Atherosclerotic heart disease of native coronary artery without angina pectoris: Secondary | ICD-10-CM | POA: Diagnosis not present

## 2023-09-03 DIAGNOSIS — I10 Essential (primary) hypertension: Secondary | ICD-10-CM | POA: Diagnosis not present

## 2023-09-03 DIAGNOSIS — E042 Nontoxic multinodular goiter: Secondary | ICD-10-CM | POA: Diagnosis not present

## 2023-09-03 DIAGNOSIS — Z01818 Encounter for other preprocedural examination: Secondary | ICD-10-CM

## 2023-09-03 HISTORY — PX: THYROIDECTOMY: SHX17

## 2023-09-03 SURGERY — THYROIDECTOMY
Anesthesia: General | Site: Neck

## 2023-09-03 MED ORDER — DEXMEDETOMIDINE HCL IN NACL 80 MCG/20ML IV SOLN
INTRAVENOUS | Status: DC | PRN
  Administered 2023-09-03: 8 ug via INTRAVENOUS

## 2023-09-03 MED ORDER — OXYCODONE HCL 5 MG/5ML PO SOLN: 5.0000 mg | Freq: Once | ORAL | Status: DC | PRN

## 2023-09-03 MED ORDER — SODIUM CHLORIDE 0.45 % IV SOLN: INTRAVENOUS | Status: DC

## 2023-09-03 MED ORDER — ONDANSETRON HCL 4 MG/2ML IJ SOLN: 4.0000 mg | Freq: Once | INTRAMUSCULAR | Status: DC | PRN

## 2023-09-03 MED ORDER — LEVOTHYROXINE SODIUM 100 MCG PO TABS
100.0000 ug | ORAL_TABLET | Freq: Every day | ORAL | 3 refills | Status: AC
Start: 2023-09-03 — End: 2024-09-02

## 2023-09-03 MED ORDER — FENTANYL CITRATE (PF) 100 MCG/2ML IJ SOLN
INTRAMUSCULAR | Status: AC
  Filled 2023-09-03: qty 2

## 2023-09-03 MED ORDER — MIDAZOLAM HCL 2 MG/2ML IJ SOLN
INTRAMUSCULAR | Status: DC | PRN
  Administered 2023-09-03: 2 mg via INTRAVENOUS

## 2023-09-03 MED ORDER — ACETAMINOPHEN 500 MG PO TABS
1000.0000 mg | ORAL_TABLET | Freq: Once | ORAL | Status: AC
  Administered 2023-09-03: 1000 mg via ORAL
  Filled 2023-09-03: qty 2

## 2023-09-03 MED ORDER — DEXAMETHASONE SODIUM PHOSPHATE 10 MG/ML IJ SOLN
INTRAMUSCULAR | Status: DC | PRN
  Administered 2023-09-03: 4 mg via INTRAVENOUS

## 2023-09-03 MED ORDER — TRAMADOL HCL 50 MG PO TABS: 50.0000 mg | ORAL_TABLET | Freq: Four times a day (QID) | ORAL | 0 refills | Status: AC | PRN

## 2023-09-03 MED ORDER — HYDROMORPHONE HCL 1 MG/ML IJ SOLN
1.0000 mg | INTRAMUSCULAR | Status: DC | PRN
  Administered 2023-09-03 – 2023-09-04 (×3): 1 mg via INTRAVENOUS
  Filled 2023-09-03 (×4): qty 1

## 2023-09-03 MED ORDER — 0.9 % SODIUM CHLORIDE (POUR BTL) OPTIME
TOPICAL | Status: DC | PRN
  Administered 2023-09-03: 1000 mL

## 2023-09-03 MED ORDER — DEXAMETHASONE SODIUM PHOSPHATE 10 MG/ML IJ SOLN
INTRAMUSCULAR | Status: AC
  Filled 2023-09-03: qty 1

## 2023-09-03 MED ORDER — DEXMEDETOMIDINE HCL IN NACL 80 MCG/20ML IV SOLN
INTRAVENOUS | Status: AC
  Filled 2023-09-03: qty 20

## 2023-09-03 MED ORDER — EPHEDRINE 5 MG/ML INJ
INTRAVENOUS | Status: AC
  Filled 2023-09-03: qty 5

## 2023-09-03 MED ORDER — STERILE WATER FOR IRRIGATION IR SOLN
Status: DC | PRN
  Administered 2023-09-03: 1000 mL

## 2023-09-03 MED ORDER — MIDAZOLAM HCL 2 MG/2ML IJ SOLN
INTRAMUSCULAR | Status: AC
  Filled 2023-09-03: qty 2

## 2023-09-03 MED ORDER — FENTANYL CITRATE (PF) 100 MCG/2ML IJ SOLN
INTRAMUSCULAR | Status: DC | PRN
  Administered 2023-09-03: 100 ug via INTRAVENOUS
  Administered 2023-09-03: 25 ug via INTRAVENOUS
  Administered 2023-09-03: 50 ug via INTRAVENOUS

## 2023-09-03 MED ORDER — OXYCODONE HCL 5 MG PO TABS: 5.0000 mg | ORAL_TABLET | Freq: Once | ORAL | Status: DC | PRN

## 2023-09-03 MED ORDER — ORAL CARE MOUTH RINSE: 15.0000 mL | Freq: Once | OROMUCOSAL | Status: AC

## 2023-09-03 MED ORDER — ONDANSETRON HCL 4 MG/2ML IJ SOLN
INTRAMUSCULAR | Status: DC | PRN
  Administered 2023-09-03: 4 mg via INTRAVENOUS

## 2023-09-03 MED ORDER — LIDOCAINE HCL (CARDIAC) PF 100 MG/5ML IV SOSY
PREFILLED_SYRINGE | INTRAVENOUS | Status: DC | PRN
  Administered 2023-09-03: 100 mg via INTRAVENOUS

## 2023-09-03 MED ORDER — CHLORHEXIDINE GLUCONATE CLOTH 2 % EX PADS: 6.0000 | MEDICATED_PAD | Freq: Once | CUTANEOUS | Status: DC

## 2023-09-03 MED ORDER — ONDANSETRON HCL 4 MG/2ML IJ SOLN
INTRAMUSCULAR | Status: AC
  Filled 2023-09-03: qty 2

## 2023-09-03 MED ORDER — ONDANSETRON 4 MG PO TBDP
4.0000 mg | ORAL_TABLET | Freq: Four times a day (QID) | ORAL | Status: DC | PRN
Start: 2023-09-03 — End: 2023-09-04

## 2023-09-03 MED ORDER — FENTANYL CITRATE PF 50 MCG/ML IJ SOSY
25.0000 ug | PREFILLED_SYRINGE | INTRAMUSCULAR | Status: DC | PRN
  Administered 2023-09-03 (×2): 25 ug via INTRAVENOUS

## 2023-09-03 MED ORDER — PROPOFOL 10 MG/ML IV BOLUS
INTRAVENOUS | Status: AC
  Filled 2023-09-03: qty 20

## 2023-09-03 MED ORDER — TRAMADOL HCL 50 MG PO TABS
50.0000 mg | ORAL_TABLET | Freq: Four times a day (QID) | ORAL | Status: DC | PRN
  Administered 2023-09-03: 50 mg via ORAL
  Filled 2023-09-03: qty 1

## 2023-09-03 MED ORDER — SUGAMMADEX SODIUM 200 MG/2ML IV SOLN
INTRAVENOUS | Status: AC
  Filled 2023-09-03: qty 2

## 2023-09-03 MED ORDER — CALCIUM CARBONATE ANTACID 500 MG PO CHEW: 2.0000 | CHEWABLE_TABLET | Freq: Two times a day (BID) | ORAL | 1 refills | Status: AC

## 2023-09-03 MED ORDER — LACTATED RINGERS IV SOLN: INTRAVENOUS | Status: DC

## 2023-09-03 MED ORDER — OXYCODONE HCL 5 MG PO TABS
5.0000 mg | ORAL_TABLET | ORAL | Status: DC | PRN
  Administered 2023-09-03: 5 mg via ORAL
  Filled 2023-09-03: qty 1

## 2023-09-03 MED ORDER — ONDANSETRON HCL 4 MG/2ML IJ SOLN
4.0000 mg | Freq: Four times a day (QID) | INTRAMUSCULAR | Status: DC | PRN
  Administered 2023-09-03 – 2023-09-04 (×3): 4 mg via INTRAVENOUS
  Filled 2023-09-03 (×3): qty 2

## 2023-09-03 MED ORDER — CEFAZOLIN SODIUM-DEXTROSE 2-4 GM/100ML-% IV SOLN
2.0000 g | INTRAVENOUS | Status: AC
  Administered 2023-09-03: 2 g via INTRAVENOUS
  Filled 2023-09-03: qty 100

## 2023-09-03 MED ORDER — PROPOFOL 10 MG/ML IV BOLUS
INTRAVENOUS | Status: DC | PRN
  Administered 2023-09-03: 130 mg via INTRAVENOUS

## 2023-09-03 MED ORDER — ROCURONIUM BROMIDE 10 MG/ML (PF) SYRINGE
PREFILLED_SYRINGE | INTRAVENOUS | Status: AC
  Filled 2023-09-03: qty 10

## 2023-09-03 MED ORDER — CHLORHEXIDINE GLUCONATE 0.12 % MT SOLN
15.0000 mL | Freq: Once | OROMUCOSAL | Status: AC
  Administered 2023-09-03: 15 mL via OROMUCOSAL

## 2023-09-03 MED ORDER — FENTANYL CITRATE PF 50 MCG/ML IJ SOSY
PREFILLED_SYRINGE | INTRAMUSCULAR | Status: AC
  Filled 2023-09-03: qty 1

## 2023-09-03 MED ORDER — SUGAMMADEX SODIUM 200 MG/2ML IV SOLN
INTRAVENOUS | Status: DC | PRN
  Administered 2023-09-03: 150 mg via INTRAVENOUS

## 2023-09-03 MED ORDER — CALCIUM CARBONATE 1250 (500 CA) MG PO TABS
2.0000 | ORAL_TABLET | Freq: Three times a day (TID) | ORAL | Status: DC
  Administered 2023-09-03: 2500 mg via ORAL
  Filled 2023-09-03 (×2): qty 2

## 2023-09-03 MED ORDER — LIDOCAINE HCL (PF) 2 % IJ SOLN
INTRAMUSCULAR | Status: AC
  Filled 2023-09-03: qty 15

## 2023-09-03 MED ORDER — ACETAMINOPHEN 650 MG RE SUPP: 650.0000 mg | Freq: Four times a day (QID) | RECTAL | Status: DC | PRN

## 2023-09-03 MED ORDER — EPHEDRINE SULFATE-NACL 50-0.9 MG/10ML-% IV SOSY
PREFILLED_SYRINGE | INTRAVENOUS | Status: DC | PRN
Start: 2023-09-03 — End: 2023-09-03
  Administered 2023-09-03: 5 mg via INTRAVENOUS

## 2023-09-03 MED ORDER — ACETAMINOPHEN 325 MG PO TABS
650.0000 mg | ORAL_TABLET | Freq: Four times a day (QID) | ORAL | Status: DC | PRN
  Administered 2023-09-03: 650 mg via ORAL
  Filled 2023-09-03: qty 2

## 2023-09-03 MED ORDER — HEMOSTATIC AGENTS (NO CHARGE) OPTIME
TOPICAL | Status: DC | PRN
  Administered 2023-09-03: 1 via TOPICAL

## 2023-09-03 MED ORDER — ROCURONIUM BROMIDE 10 MG/ML (PF) SYRINGE
PREFILLED_SYRINGE | INTRAVENOUS | Status: DC | PRN
  Administered 2023-09-03: 50 mg via INTRAVENOUS

## 2023-09-03 SURGICAL SUPPLY — 28 items
BAG COUNTER SPONGE SURGICOUNT (BAG) ×2 IMPLANT
BLADE SURG 15 STRL LF DISP TIS (BLADE) ×2 IMPLANT
CHLORAPREP W/TINT 26 (MISCELLANEOUS) ×2 IMPLANT
CLIP TI MEDIUM 6 (CLIP) ×4 IMPLANT
CLIP TI WIDE RED SMALL 6 (CLIP) ×4 IMPLANT
COVER SURGICAL LIGHT HANDLE (MISCELLANEOUS) ×2 IMPLANT
DERMABOND ADVANCED .7 DNX12 (GAUZE/BANDAGES/DRESSINGS) ×2 IMPLANT
DRAPE LAPAROTOMY T 98X78 PEDS (DRAPES) ×2 IMPLANT
DRAPE UTILITY XL STRL (DRAPES) ×2 IMPLANT
ELECT PENCIL ROCKER SW 15FT (MISCELLANEOUS) ×2 IMPLANT
ELECT REM PT RETURN 15FT ADLT (MISCELLANEOUS) ×2 IMPLANT
GAUZE 4X4 16PLY ~~LOC~~+RFID DBL (SPONGE) ×2 IMPLANT
GLOVE SURG ORTHO 8.0 STRL STRW (GLOVE) ×2 IMPLANT
GOWN STRL REUS W/ TWL XL LVL3 (GOWN DISPOSABLE) ×4 IMPLANT
HEMOSTAT SURGICEL 2X4 FIBR (HEMOSTASIS) ×2 IMPLANT
ILLUMINATOR WAVEGUIDE N/F (MISCELLANEOUS) ×2 IMPLANT
KIT BASIN OR (CUSTOM PROCEDURE TRAY) ×2 IMPLANT
KIT TURNOVER KIT A (KITS) ×2 IMPLANT
PACK BASIC VI WITH GOWN DISP (CUSTOM PROCEDURE TRAY) ×2 IMPLANT
PAD MAGNETIC INSTR ST 16X20 (MISCELLANEOUS) ×2 IMPLANT
SHEARS HARMONIC 9CM CVD (BLADE) ×2 IMPLANT
SUT MNCRL AB 4-0 PS2 18 (SUTURE) ×2 IMPLANT
SUT SILK 3 0 SH 30 (SUTURE) ×2 IMPLANT
SUT SILK 3-0 18XBRD TIE 12 (SUTURE) IMPLANT
SUT VIC AB 3-0 SH 18 (SUTURE) ×4 IMPLANT
SYR BULB IRRIG 60ML STRL (SYRINGE) ×2 IMPLANT
TOWEL OR 17X26 10 PK STRL BLUE (TOWEL DISPOSABLE) ×2 IMPLANT
TUBING CONNECTING 10 (TUBING) ×2 IMPLANT

## 2023-09-03 NOTE — Transfer of Care (Signed)
 Immediate Anesthesia Transfer of Care Note  Patient: KATERRA INGMAN  Procedure(s) Performed: TOTAL THYROIDECTOMY (Neck)  Patient Location: PACU  Anesthesia Type:General  Level of Consciousness: awake, drowsy, and patient cooperative  Airway & Oxygen Therapy: Patient Spontanous Breathing and Patient connected to face mask oxygen  Post-op Assessment: Report given to RN and Post -op Vital signs reviewed and stable  Post vital signs: Reviewed and stable  Last Vitals:  Vitals Value Taken Time  BP 133/75 09/03/23 11:45  Temp    Pulse 70 09/03/23 11:46  Resp 16 09/03/23 11:46  SpO2 100 % 09/03/23 11:46  Vitals shown include unfiled device data.  Last Pain:  Vitals:   09/03/23 0805  TempSrc: Oral  PainSc:          Complications: No notable events documented.

## 2023-09-03 NOTE — Op Note (Signed)
 Procedure Note  Pre-operative Diagnosis:  toxic nodular goiter  Post-operative Diagnosis:  same  Surgeon:  Krystal Spinner, MD  Assistant:  none   Procedure:  Total thyroidectomy  Anesthesia:  General  Estimated Blood Loss:  20 cc  Drains: none         Specimen: thyroid  to pathology  Indications:  Patient is referred by Harlene Bill, NP, for surgical evaluation and management of hyperthyroidism with multiple thyroid  nodules. Patient was originally diagnosed in 2021 when she became symptomatic with hyperthyroidism. Laboratory studies demonstrated a suppressed TSH level. She was started on methimazole. Patient has bilateral thyroid  nodules. She has undergone previous fine-needle aspiration of the dominant nodule on the right with benign cytopathology. Recent ultrasound in May 2025 shows interval enlargement of the dominant nodule on the right side, now measuring 4 cm in greatest dimension. Patient has no prior history of head or neck surgery. She had never been on thyroid  medication. There is a family history of hypothyroidism in the patient's mother. Patient is not currently taking a beta-blocker. Patient is now referred for consideration for thyroidectomy for definitive management of hyperthyroidism with multiple thyroid  nodules.   Procedure Details: Procedure was done in OR #1 at the [facility]. The patient was brought to the operating room and placed in a supine position on the operating room table. Following administration of general anesthesia, the patient was positioned and then prepped and draped in the usual aseptic fashion. After ascertaining that an adequate level of anesthesia had been achieved, a small Kocher incision was made with #15 blade. Dissection was carried through subcutaneous tissues and platysma.Hemostasis was achieved with the electrocautery. Skin flaps were elevated cephalad and caudad from the thyroid  notch to the sternal notch. A Mahorner self-retaining  retractor was placed for exposure. Strap muscles were incised in the midline and dissection was begun on the left side.  Strap muscles were reflected laterally.  Left thyroid  lobe was small and without obvious nodules.  The left lobe was gently mobilized with blunt dissection. Superior pole vessels were dissected out and divided individually between small and medium ligaclips with the harmonic scalpel. The thyroid  lobe was rolled anteriorly. Branches of the inferior thyroid  artery were divided between small ligaclips with the harmonic scalpel. Inferior venous tributaries were divided between ligaclips. Both the superior and inferior parathyroid glands were identified and preserved on their vascular pedicles. The recurrent laryngeal nerve was identified and preserved along its course. The ligament of Court was released with the electrocautery and the gland was mobilized onto the anterior trachea. Isthmus was mobilized across the midline. There was no significant pyramidal lobe present. Dry pack was placed in the left neck.  The right thyroid  lobe was gently mobilized with blunt dissection. Right thyroid  lobe was enlarged with a dominant mass in the inferior pole. Superior pole vessels were dissected out and divided between small and medium ligaclips with the Harmonic scalpel. Superior parathyroid was identified and preserved. Inferior venous tributaries were divided between medium ligaclips with the harmonic scalpel. The right thyroid  lobe was rolled anteriorly and the branches of the inferior thyroid  artery divided between small ligaclips. The right recurrent laryngeal nerve was identified and preserved along its course. The ligament of Court was released with the electrocautery. The right thyroid  lobe was mobilized onto the anterior trachea and the remainder of the thyroid  was dissected off the anterior trachea and the thyroid  was completely excised. A suture was used to mark the right lobe. The entire thyroid   gland  was submitted to pathology for review.  Palpation of the operative field demonstrated no evidence of residual disease and no abnormal lymph nodes.  The neck was irrigated with warm saline. Fibrillar was placed throughout the operative field. Strap muscles were approximated in the midline with interrupted 3-0 Vicryl sutures. Platysma was closed with interrupted 3-0 Vicryl sutures. Skin was closed with a running 4-0 Monocryl subcuticular suture. Wound was washed and Dermabond was applied. The patient was awakened from anesthesia and brought to the recovery room. The patient tolerated the procedure well.   Krystal Spinner, MD [facility] Surgery Office: [phone]

## 2023-09-03 NOTE — Anesthesia Procedure Notes (Addendum)
 Procedure Name: Intubation Date/Time: 09/03/2023 10:06 AM  Performed by: Metta Andrea NOVAK, CRNAPre-anesthesia Checklist: Patient identified, Emergency Drugs available, Suction available, Patient being monitored and Timeout performed Patient Re-evaluated:Patient Re-evaluated prior to induction Oxygen Delivery Method: Circle system utilized Preoxygenation: Pre-oxygenation with 100% oxygen Induction Type: IV induction Ventilation: Mask ventilation without difficulty Laryngoscope Size: Glidescope and 3 Tube type: Oral Tube size: 7.0 mm Number of attempts: 3 Airway Equipment and Method: Stylet Placement Confirmation: ETT inserted through vocal cords under direct vision, positive ETCO2 and breath sounds checked- equal and bilateral Secured at: 22 cm Tube secured with: Tape Dental Injury: Teeth and Oropharynx as per pre-operative assessment  Difficulty Due To: Difficulty was anticipated and Difficult Airway- due to anterior larynx Comments: Easy mask, DVL x1 with Mac 4 by Andrea Metta, CRNA, base of arytenoids visualized, DVL x 1 with Cleotilde 2 by Dr Lucious with grade 2B view, unable to get ETT positioned anteriorly enough to pass. Glidescope 3 x 1 by Andrea Metta with good visualization, atraumatic intubation, +/= BBS, + EtCO2.

## 2023-09-03 NOTE — Plan of Care (Signed)
   Problem: Education: Goal: Knowledge of General Education information will improve Description: Including pain rating scale, medication(s)/side effects and non-pharmacologic comfort measures Outcome: Progressing   Problem: Activity: Goal: Risk for activity intolerance will decrease Outcome: Progressing

## 2023-09-03 NOTE — Discharge Instructions (Addendum)

## 2023-09-03 NOTE — Anesthesia Postprocedure Evaluation (Signed)
 Anesthesia Post Note  Patient: [name]  Procedure(s) Performed: TOTAL THYROIDECTOMY (Neck)     Patient location during evaluation: PACU Anesthesia Type: General Level of consciousness: awake and alert Pain management: pain level controlled Vital Signs Assessment: post-procedure vital signs reviewed and stable Respiratory status: spontaneous breathing, nonlabored ventilation, respiratory function stable and patient connected to nasal cannula oxygen Cardiovascular status: blood pressure returned to baseline and stable Postop Assessment: no apparent nausea or vomiting Anesthetic complications: no   No notable events documented.  Last Vitals:  Vitals:   09/03/23 1230 09/03/23 1245  BP: (!) 142/74 138/71  Pulse: (!) 57 (!) 58  Resp: 13 13  Temp:    SpO2: 99% 100%    Last Pain:  Vitals:   09/03/23 1245  TempSrc:   PainSc: Asleep                 Garnette FORBES Skillern

## 2023-09-03 NOTE — Anesthesia Preprocedure Evaluation (Addendum)
 Anesthesia Evaluation  Patient identified by MRN, date of birth, ID band Patient awake    Reviewed: Allergy & Precautions, NPO status , Patient's Chart, lab work & pertinent test results  History of Anesthesia Complications Negative for: history of anesthetic complications  Airway Mallampati: II  TM Distance: <3 FB Neck ROM: Full    Dental  (+) Dental Advisory Given, Teeth Intact   Pulmonary neg pulmonary ROS   Pulmonary exam normal        Cardiovascular hypertension, + CAD  Normal cardiovascular exam     Neuro/Psych  PSYCHIATRIC DISORDERS Anxiety     negative neurological ROS     GI/Hepatic Neg liver ROS,GERD  Controlled,,  Endo/Other   Hyperthyroidism   Renal/GU negative Renal ROS     Musculoskeletal negative musculoskeletal ROS (+)    Abdominal   Peds  Hematology negative hematology ROS (+)   Anesthesia Other Findings   Reproductive/Obstetrics                              Anesthesia Physical Anesthesia Plan  ASA: 2  Anesthesia Plan: General   Post-op Pain Management: Tylenol  PO (pre-op)*   Induction: Intravenous  PONV Risk Score and Plan: 3 and Treatment may vary due to age or medical condition, Ondansetron , Dexamethasone  and Midazolam   Airway Management Planned: Oral ETT  Additional Equipment: None  Intra-op Plan:   Post-operative Plan: Extubation in OR  Informed Consent: I have reviewed the patients History and Physical, chart, labs and discussed the procedure including the risks, benefits and alternatives for the proposed anesthesia with the patient or authorized representative who has indicated his/her understanding and acceptance.     Dental advisory given  Plan Discussed with: CRNA and Anesthesiologist  Anesthesia Plan Comments:          Anesthesia Quick Evaluation

## 2023-09-03 NOTE — Interval H&P Note (Signed)
 History and Physical Interval Note:  09/03/2023 9:36 AM  [name]  has presented today for surgery, with the diagnosis of MULTIPLE THYROID  NODULES, HYPERTHYROIDISM.  The various methods of treatment have been discussed with the patient and family. After consideration of risks, benefits and other options for treatment, the patient has consented to    Procedure(s): TOTAL THYROIDECTOMY (N/A) as a surgical intervention.    The patient's history has been reviewed, patient examined, no change in status, stable for surgery.  I have reviewed the patient's chart and labs.  Questions were answered to the patient's satisfaction.    Krystal Spinner, MD [facility] practice Office: [phone]   Krystal Spinner

## 2023-09-04 ENCOUNTER — Encounter (HOSPITAL_COMMUNITY): Payer: Self-pay | Admitting: Surgery

## 2023-09-04 DIAGNOSIS — E0520 Thyrotoxicosis with toxic multinodular goiter without thyrotoxic crisis or storm: Secondary | ICD-10-CM | POA: Diagnosis not present

## 2023-09-04 LAB — CALCIUM: Calcium: 8.5 mg/dL — ABNORMAL LOW (ref 8.9–10.3)

## 2023-09-04 MED ORDER — SODIUM CHLORIDE 0.9 % IV BOLUS
500.0000 mL | Freq: Once | INTRAVENOUS | Status: AC
  Administered 2023-09-04: 500 mL via INTRAVENOUS

## 2023-09-04 MED ORDER — KETOROLAC TROMETHAMINE 15 MG/ML IJ SOLN
15.0000 mg | Freq: Once | INTRAMUSCULAR | Status: AC
  Administered 2023-09-04: 15 mg via INTRAVENOUS
  Filled 2023-09-04: qty 1

## 2023-09-04 MED ORDER — SODIUM CHLORIDE 0.9 % IV SOLN
12.5000 mg | Freq: Once | INTRAVENOUS | Status: DC
  Filled 2023-09-04: qty 0.5

## 2023-09-04 MED ORDER — PROCHLORPERAZINE EDISYLATE 10 MG/2ML IJ SOLN
10.0000 mg | Freq: Once | INTRAMUSCULAR | Status: AC
  Administered 2023-09-04: 10 mg via INTRAVENOUS
  Filled 2023-09-04: qty 2

## 2023-09-04 MED ORDER — ONDANSETRON HCL 4 MG PO TABS: 4.0000 mg | ORAL_TABLET | Freq: Every day | ORAL | 1 refills | Status: AC | PRN

## 2023-09-04 NOTE — Progress Notes (Signed)
 1 Day Post-Op   Subjective/Chief Complaint: Nausea, cannot take anything down   Objective: Vital signs in last 24 hours: Temp:  [97.3 F (36.3 C)-98 F (36.7 C)] 98 F (36.7 C) (08/30 0724) Pulse Rate:  [55-73] 68 (08/30 0724) Resp:  [13-18] 18 (08/30 0724) BP: (116-142)/(52-75) 116/52 (08/30 0724) SpO2:  [97 %-100 %] 100 % (08/30 0724) Last BM Date : 09/03/23  Intake/Output from previous day: 08/29 0701 - 08/30 0700 In: 1956.3 [P.O.:300; I.V.:1556.3; IV Piggyback:100] Out: 10 [Blood:10] Intake/Output this shift: No intake/output data recorded.  General nad Neck incision clean no hematoma  Lab Results:  No results for input(s): WBC, HGB, HCT, PLT in the last 72 hours. BMET Recent Labs    09/04/23 0538  CALCIUM  8.5*   PT/INR No results for input(s): LABPROT, INR in the last 72 hours. ABG No results for input(s): PHART, HCO3 in the last 72 hours.  Invalid input(s): PCO2, PO2  Studies/Results: No results found.  Anti-infectives: Anti-infectives (From admission, onward)    Start     Dose/Rate Route Frequency Ordered Stop   09/03/23 0800  ceFAZolin  (ANCEF ) IVPB 2g/100 mL premix        2 g 200 mL/hr over 30 Minutes Intravenous On call to O.R. 09/03/23 0750 09/03/23 1007       Assessment/Plan: POD 1 total thyroid  -current symptoms likely anesthesia related or dilaudid  -will stop dilaudid - she was using this for a ha- give one dose of toradol  (neck soft with no concern) to help -try regular diet -will give one dose compazine  -labs look ok -if does better can go home later today   [name] 09/04/2023

## 2023-09-04 NOTE — Plan of Care (Signed)
   Problem: Education: Goal: Knowledge of General Education information will improve Description Including pain rating scale, medication(s)/side effects and non-pharmacologic comfort measures Outcome: Progressing

## 2023-09-07 ENCOUNTER — Ambulatory Visit: Payer: Self-pay | Admitting: Surgery

## 2023-09-07 LAB — SURGICAL PATHOLOGY

## 2023-09-07 NOTE — Progress Notes (Signed)
 Good news!  Final pathology is benign.  Krystal Spinner, MD [facility] practice Office: [phone]

## 2023-09-17 DIAGNOSIS — B356 Tinea cruris: Secondary | ICD-10-CM | POA: Diagnosis not present

## 2023-09-17 DIAGNOSIS — R21 Rash and other nonspecific skin eruption: Secondary | ICD-10-CM | POA: Diagnosis not present

## 2023-09-22 DIAGNOSIS — B372 Candidiasis of skin and nail: Secondary | ICD-10-CM | POA: Diagnosis not present

## 2023-09-22 DIAGNOSIS — R0981 Nasal congestion: Secondary | ICD-10-CM | POA: Diagnosis not present

## 2023-09-22 DIAGNOSIS — Z1152 Encounter for screening for COVID-19: Secondary | ICD-10-CM | POA: Diagnosis not present

## 2023-09-22 DIAGNOSIS — R051 Acute cough: Secondary | ICD-10-CM | POA: Diagnosis not present

## 2023-09-22 DIAGNOSIS — J069 Acute upper respiratory infection, unspecified: Secondary | ICD-10-CM | POA: Diagnosis not present

## 2023-09-22 DIAGNOSIS — J029 Acute pharyngitis, unspecified: Secondary | ICD-10-CM | POA: Diagnosis not present

## 2023-09-23 DIAGNOSIS — Z9089 Acquired absence of other organs: Secondary | ICD-10-CM | POA: Diagnosis not present

## 2023-09-23 DIAGNOSIS — Z9889 Other specified postprocedural states: Secondary | ICD-10-CM | POA: Diagnosis not present

## 2023-09-23 DIAGNOSIS — E042 Nontoxic multinodular goiter: Secondary | ICD-10-CM | POA: Diagnosis not present

## 2023-09-29 DIAGNOSIS — E0590 Thyrotoxicosis, unspecified without thyrotoxic crisis or storm: Secondary | ICD-10-CM | POA: Diagnosis not present

## 2023-11-09 DIAGNOSIS — E890 Postprocedural hypothyroidism: Secondary | ICD-10-CM | POA: Diagnosis not present

## 2023-11-23 DIAGNOSIS — E7849 Other hyperlipidemia: Secondary | ICD-10-CM | POA: Diagnosis not present

## 2023-11-24 ENCOUNTER — Telehealth: Payer: Self-pay | Admitting: Cardiovascular Disease

## 2023-11-24 DIAGNOSIS — K219 Gastro-esophageal reflux disease without esophagitis: Secondary | ICD-10-CM | POA: Diagnosis not present

## 2023-11-24 DIAGNOSIS — E039 Hypothyroidism, unspecified: Secondary | ICD-10-CM | POA: Diagnosis not present

## 2023-11-24 DIAGNOSIS — D049 Carcinoma in situ of skin, unspecified: Secondary | ICD-10-CM | POA: Diagnosis not present

## 2023-11-24 DIAGNOSIS — Z0000 Encounter for general adult medical examination without abnormal findings: Secondary | ICD-10-CM | POA: Diagnosis not present

## 2023-11-24 DIAGNOSIS — Z8639 Personal history of other endocrine, nutritional and metabolic disease: Secondary | ICD-10-CM | POA: Diagnosis not present

## 2023-11-24 DIAGNOSIS — I2510 Atherosclerotic heart disease of native coronary artery without angina pectoris: Secondary | ICD-10-CM | POA: Diagnosis not present

## 2023-11-24 DIAGNOSIS — E785 Hyperlipidemia, unspecified: Secondary | ICD-10-CM | POA: Diagnosis not present

## 2023-11-24 DIAGNOSIS — M810 Age-related osteoporosis without current pathological fracture: Secondary | ICD-10-CM | POA: Diagnosis not present

## 2023-11-24 MED ORDER — ROSUVASTATIN CALCIUM 20 MG PO TABS: 20.0000 mg | ORAL_TABLET | Freq: Every day | ORAL | 2 refills | Status: AC

## 2023-11-24 NOTE — Telephone Encounter (Signed)
 Refill sent.

## 2023-11-24 NOTE — Telephone Encounter (Signed)
*  STAT* If patient is at the pharmacy, call can be transferred to refill team.   1. Which medications need to be refilled? (please list name of each medication and dose if known) rosuvastatin  (CRESTOR ) 20 MG tablet   NEW PHARMACY   4. Which pharmacy/location (including street and city if local pharmacy) is medication to be sent to?  [id number] [place], [place] - [address] AT [facility] [address] [facility] [address] Phone: [phone]  Fax: [phone]       5. Do they need a 30 day or 90 day supply? 90  Pt states she is out
